# Patient Record
Sex: Female | Born: 1990 | Race: Black or African American | Hispanic: No | Marital: Single | Smoking: Current every day smoker
Health system: Southern US, Community
[De-identification: ages and names within clinical notes are randomized; demographics above are authoritative.]

## PROBLEM LIST (undated history)

## (undated) ENCOUNTER — Inpatient Hospital Stay (HOSPITAL_COMMUNITY): Payer: Self-pay

## (undated) DIAGNOSIS — O139 Gestational [pregnancy-induced] hypertension without significant proteinuria, unspecified trimester: Secondary | ICD-10-CM

## (undated) DIAGNOSIS — N739 Female pelvic inflammatory disease, unspecified: Secondary | ICD-10-CM

## (undated) DIAGNOSIS — K219 Gastro-esophageal reflux disease without esophagitis: Secondary | ICD-10-CM

## (undated) DIAGNOSIS — I10 Essential (primary) hypertension: Secondary | ICD-10-CM

## (undated) HISTORY — DX: Gestational (pregnancy-induced) hypertension without significant proteinuria, unspecified trimester: O13.9

## (undated) HISTORY — PX: WISDOM TOOTH EXTRACTION: SHX21

---
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
## 2010-06-01 DIAGNOSIS — O365990 Maternal care for other known or suspected poor fetal growth, unspecified trimester, not applicable or unspecified: Secondary | ICD-10-CM

## 2012-10-28 ENCOUNTER — Emergency Department (HOSPITAL_COMMUNITY): Payer: Self-pay

## 2012-10-28 ENCOUNTER — Encounter (HOSPITAL_COMMUNITY): Payer: Self-pay | Admitting: *Deleted

## 2012-10-28 ENCOUNTER — Emergency Department (HOSPITAL_COMMUNITY)
Admission: EM | Admit: 2012-10-28 | Discharge: 2012-10-28 | Disposition: A | Discharge status: Home / Self Care | Payer: Self-pay | Attending: Emergency Medicine | Admitting: Emergency Medicine

## 2012-10-28 DIAGNOSIS — S0083XA Contusion of other part of head, initial encounter: Secondary | ICD-10-CM

## 2012-10-28 DIAGNOSIS — Z79899 Other long term (current) drug therapy: Secondary | ICD-10-CM | POA: Insufficient documentation

## 2012-10-28 DIAGNOSIS — S0990XA Unspecified injury of head, initial encounter: Secondary | ICD-10-CM | POA: Insufficient documentation

## 2012-10-28 DIAGNOSIS — F17200 Nicotine dependence, unspecified, uncomplicated: Secondary | ICD-10-CM | POA: Insufficient documentation

## 2012-10-28 DIAGNOSIS — S0003XA Contusion of scalp, initial encounter: Secondary | ICD-10-CM | POA: Insufficient documentation

## 2012-10-28 MED ORDER — HYDROCODONE-ACETAMINOPHEN 5-325 MG PO TABS
2.0000 | ORAL_TABLET | Freq: Once | ORAL | Status: AC
  Administered 2012-10-28: 2 via ORAL
  Filled 2012-10-28: qty 2

## 2012-10-28 MED ORDER — NAPROXEN 500 MG PO TABS: 500.0000 mg | ORAL_TABLET | Freq: Two times a day (BID) | ORAL | Status: DC

## 2012-10-28 NOTE — ED Notes (Signed)
Discharge instructions reviewed with pt, questions answered. Pt verbalized understanding.  
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
## 2012-10-28 NOTE — ED Notes (Signed)
[employer] finished speaking with pt

## 2013-04-21 ENCOUNTER — Encounter (HOSPITAL_COMMUNITY): Payer: Self-pay | Admitting: Emergency Medicine

## 2013-04-21 ENCOUNTER — Emergency Department (HOSPITAL_COMMUNITY)
Admission: EM | Admit: 2013-04-21 | Discharge: 2013-04-22 | Disposition: A | Discharge status: Home / Self Care | Payer: BC Managed Care – PPO | Attending: Emergency Medicine | Admitting: Emergency Medicine

## 2013-04-21 DIAGNOSIS — F17200 Nicotine dependence, unspecified, uncomplicated: Secondary | ICD-10-CM | POA: Insufficient documentation

## 2013-04-21 DIAGNOSIS — Z3202 Encounter for pregnancy test, result negative: Secondary | ICD-10-CM | POA: Insufficient documentation

## 2013-04-21 DIAGNOSIS — N949 Unspecified condition associated with female genital organs and menstrual cycle: Secondary | ICD-10-CM | POA: Insufficient documentation

## 2013-04-21 DIAGNOSIS — R102 Pelvic and perineal pain: Secondary | ICD-10-CM

## 2013-04-21 DIAGNOSIS — R3919 Other difficulties with micturition: Secondary | ICD-10-CM | POA: Insufficient documentation

## 2013-04-21 DIAGNOSIS — R112 Nausea with vomiting, unspecified: Secondary | ICD-10-CM | POA: Insufficient documentation

## 2013-04-21 LAB — CBC WITH DIFFERENTIAL/PLATELET
Basophils Absolute: 0 10*3/uL (ref 0.0–0.1)
Basophils Relative: 0 % (ref 0–1)
Eosinophils Absolute: 0.4 10*3/uL (ref 0.0–0.7)
Eosinophils Relative: 3 % (ref 0–5)
HCT: 40.1 % (ref 36.0–46.0)
Hemoglobin: 13 g/dL (ref 12.0–15.0)
Lymphocytes Relative: 25 % (ref 12–46)
Lymphs Abs: 3.2 10*3/uL (ref 0.7–4.0)
MCH: 28.3 pg (ref 26.0–34.0)
MCHC: 32.4 g/dL (ref 30.0–36.0)
MCV: 87.2 fL (ref 78.0–100.0)
Monocytes Absolute: 0.6 10*3/uL (ref 0.1–1.0)
Monocytes Relative: 5 % (ref 3–12)
Neutro Abs: 8.7 10*3/uL — ABNORMAL HIGH (ref 1.7–7.7)
Neutrophils Relative %: 67 % (ref 43–77)
Platelets: 256 10*3/uL (ref 150–400)
RBC: 4.6 MIL/uL (ref 3.87–5.11)
RDW: 14.3 % (ref 11.5–15.5)
WBC: 12.9 10*3/uL — ABNORMAL HIGH (ref 4.0–10.5)

## 2013-04-21 LAB — COMPREHENSIVE METABOLIC PANEL
ALT: 10 U/L (ref 0–35)
AST: 17 U/L (ref 0–37)
Albumin: 3.8 g/dL (ref 3.5–5.2)
Alkaline Phosphatase: 67 U/L (ref 39–117)
BUN: 12 mg/dL (ref 6–23)
CO2: 26 mEq/L (ref 19–32)
Calcium: 9.3 mg/dL (ref 8.4–10.5)
Chloride: 103 mEq/L (ref 96–112)
Creatinine, Ser: 0.91 mg/dL (ref 0.50–1.10)
GFR calc Af Amer: >90 mL/min (ref >90)
GFR calc non Af Amer: 88 mL/min — ABNORMAL LOW (ref >90)
Glucose, Bld: 94 mg/dL (ref 70–99)
Potassium: 4 mEq/L (ref 3.7–5.3)
Sodium: 139 mEq/L (ref 137–147)
Total Bilirubin: 0.5 mg/dL (ref 0.3–1.2)
Total Protein: 7.7 g/dL (ref 6.0–8.3)

## 2013-04-21 LAB — POC URINE PREG, ED: Preg Test, Ur: NEGATIVE

## 2013-04-21 MED ORDER — ONDANSETRON HCL 4 MG/2ML IJ SOLN
4.0000 mg | Freq: Once | INTRAMUSCULAR | Status: AC
  Administered 2013-04-22: 4 mg via INTRAMUSCULAR
  Filled 2013-04-21: qty 2

## 2013-04-21 MED ORDER — MORPHINE SULFATE 4 MG/ML IJ SOLN
4.0000 mg | Freq: Once | INTRAMUSCULAR | Status: AC
  Administered 2013-04-22: 4 mg via INTRAVENOUS
  Filled 2013-04-21: qty 1

## 2013-04-21 NOTE — ED Notes (Signed)
Pt c/o lower abdominal pain and nausea since this morning.

## 2013-04-22 ENCOUNTER — Emergency Department (HOSPITAL_COMMUNITY): Payer: BC Managed Care – PPO

## 2013-04-22 LAB — URINALYSIS, ROUTINE W REFLEX MICROSCOPIC
Bilirubin Urine: NEGATIVE
Glucose, UA: NEGATIVE mg/dL
Hgb urine dipstick: NEGATIVE
Ketones, ur: NEGATIVE mg/dL
Leukocytes, UA: NEGATIVE
Nitrite: NEGATIVE
Protein, ur: NEGATIVE mg/dL
Specific Gravity, Urine: 1.02 (ref 1.005–1.030)
Urobilinogen, UA: 0.2 mg/dL (ref 0.0–1.0)
pH: 8 (ref 5.0–8.0)

## 2013-04-22 LAB — WET PREP, GENITAL
Clue Cells Wet Prep HPF POC: NONE SEEN
Trich, Wet Prep: NONE SEEN
Yeast Wet Prep HPF POC: NONE SEEN

## 2013-04-22 LAB — PREGNANCY, URINE: Preg Test, Ur: NEGATIVE

## 2013-04-22 LAB — RPR: RPR Ser Ql: NONREACTIVE

## 2013-04-22 MED ORDER — HYDROCODONE-ACETAMINOPHEN 5-325 MG PO TABS: 1.0000 | ORAL_TABLET | ORAL | Status: DC | PRN

## 2013-04-22 MED ORDER — ONDANSETRON 8 MG PO TBDP: 8.0000 mg | ORAL_TABLET | Freq: Three times a day (TID) | ORAL | Status: DC | PRN

## 2013-04-22 MED ORDER — MORPHINE SULFATE 4 MG/ML IJ SOLN
8.0000 mg | Freq: Once | INTRAMUSCULAR | Status: AC
  Administered 2013-04-22: 8 mg via INTRAVENOUS
  Filled 2013-04-22: qty 2

## 2013-04-22 MED ORDER — MORPHINE SULFATE 4 MG/ML IJ SOLN
INTRAMUSCULAR | Status: AC
  Filled 2013-04-22: qty 1

## 2013-04-22 NOTE — Discharge Instructions (Signed)
Abdominal Pain, Women °Abdominal (stomach, pelvic, or belly) pain can be caused by many things. It is important to tell your doctor: °· The location of the pain. °· Does it come and go or is it present all the time? °· Are there things that start the pain (eating certain foods, exercise)? °· Are there other symptoms associated with the pain (fever, nausea, vomiting, diarrhea)? °All of this is helpful to know when trying to find the cause of the pain. °CAUSES  °· Stomach: virus or bacteria infection, or ulcer. °· Intestine: appendicitis (inflamed appendix), regional ileitis (Crohn's disease), ulcerative colitis (inflamed colon), irritable bowel syndrome, diverticulitis (inflamed diverticulum of the colon), or cancer of the stomach or intestine. °· Gallbladder disease or stones in the gallbladder. °· Kidney disease, kidney stones, or infection. °· Pancreas infection or cancer. °· Fibromyalgia (pain disorder). °· Diseases of the female organs: °· Uterus: fibroid (non-cancerous) tumors or infection. °· Fallopian tubes: infection or tubal pregnancy. °· Ovary: cysts or tumors. °· Pelvic adhesions (scar tissue). °· Endometriosis (uterus lining tissue growing in the pelvis and on the pelvic organs). °· Pelvic congestion syndrome (female organs filling up with blood just before the menstrual period). °· Pain with the menstrual period. °· Pain with ovulation (producing an egg). °· Pain with an IUD (intrauterine device, birth control) in the uterus. °· Cancer of the female organs. °· Functional pain (pain not caused by a disease, may improve without treatment). °· Psychological pain. °· Depression. °DIAGNOSIS  °Your doctor will decide the seriousness of your pain by doing an examination. °· Blood tests. °· X-rays. °· Ultrasound. °· CT scan (computed tomography, special type of X-ray). °· MRI (magnetic resonance imaging). °· Cultures, for infection. °· Barium enema (dye inserted in the large intestine, to better view it with  X-rays). °· Colonoscopy (looking in intestine with a lighted tube). °· Laparoscopy (minor surgery, looking in abdomen with a lighted tube). °· Major abdominal exploratory surgery (looking in abdomen with a large incision). °TREATMENT  °The treatment will depend on the cause of the pain.  °· Many cases can be observed and treated at home. °· Over-the-counter medicines recommended by your caregiver. °· Prescription medicine. °· Antibiotics, for infection. °· Birth control pills, for painful periods or for ovulation pain. °· Hormone treatment, for endometriosis. °· Nerve blocking injections. °· Physical therapy. °· Antidepressants. °· Counseling with a psychologist or psychiatrist. °· Minor or major surgery. °HOME CARE INSTRUCTIONS  °· Do not take laxatives, unless directed by your caregiver. °· Take over-the-counter pain medicine only if ordered by your caregiver. Do not take aspirin because it can cause an upset stomach or bleeding. °· Try a clear liquid diet (broth or water) as ordered by your caregiver. Slowly move to a bland diet, as tolerated, if the pain is related to the stomach or intestine. °· Have a thermometer and take your temperature several times a day, and record it. °· Bed rest and sleep, if it helps the pain. °· Avoid sexual intercourse, if it causes pain. °· Avoid stressful situations. °· Keep your follow-up appointments and tests, as your caregiver orders. °· If the pain does not go away with medicine or surgery, you may try: °· Acupuncture. °· Relaxation exercises (yoga, meditation). °· Group therapy. °· Counseling. °SEEK MEDICAL CARE IF:  °· You notice certain foods cause stomach pain. °· Your home care treatment is not helping your pain. °· You need stronger pain medicine. °· You want your IUD removed. °· You feel faint or   lightheaded. °· You develop nausea and vomiting. °· You develop a rash. °· You are having side effects or an allergy to your medicine. °SEEK IMMEDIATE MEDICAL CARE IF:  °· Your  pain does not go away or gets worse. °· You have a fever. °· Your pain is felt only in portions of the abdomen. The right side could possibly be appendicitis. The left lower portion of the abdomen could be colitis or diverticulitis. °· You are passing blood in your stools (bright red or black tarry stools, with or without vomiting). °· You have blood in your urine. °· You develop chills, with or without a fever. °· You pass out. °MAKE SURE YOU:  °· Understand these instructions. °· Will watch your condition. °· Will get help right away if you are not doing well or get worse. °Document Released: 11/08/2006 Document Revised: 04/05/2011 Document Reviewed: 11/28/2008 °[employer]® Patient Information ©2014 [employer]. ° °

## 2013-04-22 NOTE — ED Provider Notes (Signed)
CSN: [id number]     Arrival date & time 04/21/13  2132 History   First MD Initiated Contact with Patient 04/21/13 2218     Chief Complaint  Patient presents with  . Abdominal Pain     (Consider location/radiation/quality/duration/timing/severity/associated sxs/prior Treatment) HPI Comments: Priscilla Castillo is a 23 y.o. Female presenting with lower abdominal pain with nausea and vomiting x 2 starting gradually around 1 pm today.  She describes having some vague lower left side pain, cramping (like menstrual cramping) with intermittent sharp stabs of pain which has slowly progressed to now include her entire lower pelvis. She also had low back pain yesterday which has now improved and has felt some increased pressure with need to urinate small amounts of urine, but denies painful urination or hematuria.  She has had an appetite but has been unable to keep anything down since this afternoon. She denies diarrhea or constipation.  She denies fevers or chills.  She has had no vaginal discharge and has had normal regular periods, LMP 04/01/13.  She does report having a recent std screening at the local health dept which was negative.  Past medical history is significant for c section x 2 (most recently 3 years ago).      The history is provided by the patient.    History reviewed. No pertinent past medical history. Past Surgical History  Procedure Laterality Date  . Cesarean section      x 2   History reviewed. No pertinent family history. History  Substance Use Topics  . Smoking status: Current Every Day Smoker -- 0.33 packs/day    Types: Cigarettes  . Smokeless tobacco: Not on file  . Alcohol Use: Yes     Comment: alcoholic beverage 2 x week   OB History   Grav Para Term Preterm Abortions TAB SAB Ect Mult Living   4 2 2             Review of Systems  Constitutional: Negative for fever.  HENT: Negative for congestion and sore throat.   Eyes: Negative.   Respiratory: Negative for  chest tightness and shortness of breath.   Cardiovascular: Negative for chest pain.  Gastrointestinal: Negative for nausea and abdominal pain.  Genitourinary: Positive for decreased urine volume and pelvic pain. Negative for urgency, frequency and vaginal discharge.  Musculoskeletal: Negative for arthralgias, joint swelling and neck pain.  Skin: Negative.  Negative for rash and wound.  Neurological: Negative for dizziness, weakness, light-headedness, numbness and headaches.  Psychiatric/Behavioral: Negative.       Allergies  Shellfish allergy  Home Medications  No current outpatient prescriptions on file. BP 150/87  Pulse 89  Temp(Src) 100.1 F (37.8 C)  Resp 20  Ht 5\' 1"  (1.549 m)  Wt 200 lb (90.719 kg)  BMI 37.81 kg/m2  SpO2 96%  LMP 04/01/2013 Physical Exam  Nursing note and vitals reviewed. Constitutional: She appears well-developed and well-nourished.  HENT:  Head: Normocephalic and atraumatic.  Eyes: Conjunctivae are normal.  Neck: Normal range of motion.  Cardiovascular: Normal rate, regular rhythm, normal heart sounds and intact distal pulses.   Pulmonary/Chest: Effort normal and breath sounds normal. She has no wheezes.  Abdominal: Soft. Bowel sounds are normal. There is no hepatosplenomegaly. There is tenderness. There is no rebound, no CVA tenderness and no tenderness at McBurney's point.  Genitourinary: Vagina normal. Uterus is tender. Uterus is not enlarged. Cervix exhibits no motion tenderness. Right adnexum displays no mass and no fullness. Left adnexum displays no mass,  no tenderness and no fullness. No vaginal discharge found.  Musculoskeletal: Normal range of motion.  Neurological: She is alert.  Skin: Skin is warm and dry.  Psychiatric: She has a normal mood and affect.    ED Course  Procedures (including critical care time) Labs Review Labs Reviewed  WET PREP, GENITAL - Abnormal; Notable for the following:    WBC, Wet Prep HPF POC FEW (*)    All  other components within normal limits  CBC WITH DIFFERENTIAL - Abnormal; Notable for the following:    WBC 12.9 (*)    Neutro Abs 8.7 (*)    All other components within normal limits  COMPREHENSIVE METABOLIC PANEL - Abnormal; Notable for the following:    GFR calc non Af Amer 88 (*)    All other components within normal limits  GC/CHLAMYDIA PROBE AMP  URINALYSIS, ROUTINE W REFLEX MICROSCOPIC  PREGNANCY, URINE  RPR  POC URINE PREG, ED   Imaging Review No results found.   EKG Interpretation None      MDM   Final diagnoses:  Pelvic pain    Patients labs and/or radiological studies were viewed and considered during the medical decision making and disposition process. Pt with llq pelvic pain with sharp stabs of pain radiating to the midline and rlq.  Will need US to assess for ovarian torsion vs  Cyst.  Pt pain 8/10 after receiving morphine 4 mg, will repeat morphine 8 mg while awaiting US.  Discussed with Dr. Patria Maneampos who will assume care of patient.    Burgess AmorJulie Ashira Kirsten, PA-C 04/22/13 [phone]

## 2013-04-22 NOTE — ED Provider Notes (Signed)
Medical screening examination/treatment/procedure(s) were conducted as a shared visit with non-physician practitioner(s) and myself.  I personally evaluated the patient during the encounter.   EKG Interpretation None      US Transvaginal Non-ob  04/22/2013   CLINICAL DATA:  Pelvic pain.  Assess for cyst or torsion.  EXAM: TRANSABDOMINAL AND TRANSVAGINAL ULTRASOUND OF PELVIS  DOPPLER ULTRASOUND OF OVARIES  TECHNIQUE: Both transabdominal and transvaginal ultrasound examinations of the pelvis were performed. Transabdominal technique was performed for global imaging of the pelvis including uterus, ovaries, adnexal regions, and pelvic cul-de-sac.  It was necessary to proceed with endovaginal exam following the transabdominal exam to visualize the endometrium and adnexa. Color and duplex Doppler ultrasound was utilized to evaluate blood flow to the ovaries.  COMPARISON:  None.  FINDINGS: Uterus  Measurements: 8 x 4 x 5 cm. No fibroids or other mass visualized. Cesarean section scar noted.  Endometrium  Thickness: 11 mm.  No focal abnormality visualized.  Right ovary  Measurements: 3.4 x 1.8 x 1.6 cm. Normal appearance/no adnexal mass.  Left ovary  Measurements: 3.9 x 2.5 x 2.7 cm. Normal appearance/no adnexal mass.  Pulsed Doppler evaluation of both ovaries demonstrates normal low-resistance arterial and venous waveforms.  Other findings  On transvaginal imaging, apparent thickening of the bladder wall is presumably related to underdistention given the negative urinalysis.  IMPRESSION: Negative pelvic ultrasound.  No ovarian torsion.   Electronically Signed   By: Priscilla Castillo M.D.   On: 04/22/2013 03:18   US Pelvis Complete  04/22/2013   CLINICAL DATA:  Pelvic pain.  Assess for cyst or torsion.  EXAM: TRANSABDOMINAL AND TRANSVAGINAL ULTRASOUND OF PELVIS  DOPPLER ULTRASOUND OF OVARIES  TECHNIQUE: Both transabdominal and transvaginal ultrasound examinations of the pelvis were performed. Transabdominal  technique was performed for global imaging of the pelvis including uterus, ovaries, adnexal regions, and pelvic cul-de-sac.  It was necessary to proceed with endovaginal exam following the transabdominal exam to visualize the endometrium and adnexa. Color and duplex Doppler ultrasound was utilized to evaluate blood flow to the ovaries.  COMPARISON:  None.  FINDINGS: Uterus  Measurements: 8 x 4 x 5 cm. No fibroids or other mass visualized. Cesarean section scar noted.  Endometrium  Thickness: 11 mm.  No focal abnormality visualized.  Right ovary  Measurements: 3.4 x 1.8 x 1.6 cm. Normal appearance/no adnexal mass.  Left ovary  Measurements: 3.9 x 2.5 x 2.7 cm. Normal appearance/no adnexal mass.  Pulsed Doppler evaluation of both ovaries demonstrates normal low-resistance arterial and venous waveforms.  Other findings  On transvaginal imaging, apparent thickening of the bladder wall is presumably related to underdistention given the negative urinalysis.  IMPRESSION: Negative pelvic ultrasound.  No ovarian torsion.   Electronically Signed   By: Priscilla Castillo M.D.   On: 04/22/2013 03:18   [place] Art/ven Flow Abd Pelv Doppler  04/22/2013   CLINICAL DATA:  Pelvic pain.  Assess for cyst or torsion.  EXAM: TRANSABDOMINAL AND TRANSVAGINAL ULTRASOUND OF PELVIS  DOPPLER ULTRASOUND OF OVARIES  TECHNIQUE: Both transabdominal and transvaginal ultrasound examinations of the pelvis were performed. Transabdominal technique was performed for global imaging of the pelvis including uterus, ovaries, adnexal regions, and pelvic cul-de-sac.  It was necessary to proceed with endovaginal exam following the transabdominal exam to visualize the endometrium and adnexa. Color and duplex Doppler ultrasound was utilized to evaluate blood flow to the ovaries.  COMPARISON:  None.  FINDINGS: Uterus  Measurements: 8 x 4 x 5 cm. No fibroids or other mass  visualized. Cesarean section scar noted.  Endometrium  Thickness: 11 mm.  No focal abnormality  visualized.  Right ovary  Measurements: 3.4 x 1.8 x 1.6 cm. Normal appearance/no adnexal mass.  Left ovary  Measurements: 3.9 x 2.5 x 2.7 cm. Normal appearance/no adnexal mass.  Pulsed Doppler evaluation of both ovaries demonstrates normal low-resistance arterial and venous waveforms.  Other findings  On transvaginal imaging, apparent thickening of the bladder wall is presumably related to underdistention given the negative urinalysis.  IMPRESSION: Negative pelvic ultrasound.  No ovarian torsion.   Electronically Signed   By: Priscilla PeaJonathan  Castillo M.D.   On: 04/22/2013 03:18   4:27 AM Patient is feeling better at this time.  Abdominal exam demonstrates only very mild tenderness around her suprapubic and infraumbilical region.  No left lower quadrant tenderness.  No right lower quadrant tenderness.  No guarding or rebound.  No peritoneal signs.  I long discussion with the patient and the patient's significant other in the room.  The patient has had intermittent symptoms like this before with this seems more severe.  We had long discussion about the possibility of this being appendicitis.  She's had symptoms for approximately 12-14 hours at this time.  The patient was given the option of CT scan now versus observation at home with repeat ER visit in 12 hours if she continues having symptoms.  She would prefer to go home at this time.  She does state that she will return later today if she continues having discomfort at which point she will likely need a CT scan of her abdomen and pelvis.  My suspicion for appendicitis is lower at this time and she's had intermittent issues before this may be more GYN related.  She does have a mild leukocytosis here.  She did have an oral temperature 100.2.  No other significant abnormalities.  Patient be sent home with pain medicine and instructed to return to the ER in 12 hours for ongoing symptoms.   Priscilla CoKevin M Todd Argabright, MD 04/22/13 0430

## 2013-04-23 LAB — GC/CHLAMYDIA PROBE AMP
CT Probe RNA: NEGATIVE
GC Probe RNA: NEGATIVE

## 2013-08-05 ENCOUNTER — Encounter (HOSPITAL_COMMUNITY): Payer: Self-pay | Admitting: Emergency Medicine

## 2013-08-05 ENCOUNTER — Emergency Department (HOSPITAL_COMMUNITY)
Admission: EM | Admit: 2013-08-05 | Discharge: 2013-08-05 | Disposition: A | Discharge status: Home / Self Care | Payer: BC Managed Care – PPO | Attending: Emergency Medicine | Admitting: Emergency Medicine

## 2013-08-05 DIAGNOSIS — R42 Dizziness and giddiness: Secondary | ICD-10-CM | POA: Diagnosis not present

## 2013-08-05 DIAGNOSIS — R110 Nausea: Secondary | ICD-10-CM | POA: Insufficient documentation

## 2013-08-05 DIAGNOSIS — E860 Dehydration: Secondary | ICD-10-CM | POA: Diagnosis not present

## 2013-08-05 DIAGNOSIS — F411 Generalized anxiety disorder: Secondary | ICD-10-CM | POA: Diagnosis not present

## 2013-08-05 DIAGNOSIS — F17200 Nicotine dependence, unspecified, uncomplicated: Secondary | ICD-10-CM | POA: Diagnosis not present

## 2013-08-05 DIAGNOSIS — Z3202 Encounter for pregnancy test, result negative: Secondary | ICD-10-CM | POA: Insufficient documentation

## 2013-08-05 DIAGNOSIS — R000 Tachycardia, unspecified: Secondary | ICD-10-CM | POA: Diagnosis not present

## 2013-08-05 DIAGNOSIS — R0602 Shortness of breath: Secondary | ICD-10-CM | POA: Diagnosis not present

## 2013-08-05 LAB — URINALYSIS, ROUTINE W REFLEX MICROSCOPIC
Bilirubin Urine: NEGATIVE
Glucose, UA: NEGATIVE mg/dL
Hgb urine dipstick: NEGATIVE
Ketones, ur: NEGATIVE mg/dL
Nitrite: NEGATIVE
Protein, ur: NEGATIVE mg/dL
Specific Gravity, Urine: 1.01 (ref 1.005–1.030)
Urobilinogen, UA: 0.2 mg/dL (ref 0.0–1.0)
pH: 6 (ref 5.0–8.0)

## 2013-08-05 LAB — I-STAT CHEM 8, ED
BUN: 19 mg/dL (ref 6–23)
Calcium, Ion: 1.17 mmol/L (ref 1.12–1.23)
Chloride: 106 mEq/L (ref 96–112)
Creatinine, Ser: 1.1 mg/dL (ref 0.50–1.10)
Glucose, Bld: 101 mg/dL — ABNORMAL HIGH (ref 70–99)
HCT: 46 % (ref 36.0–46.0)
Hemoglobin: 15.6 g/dL — ABNORMAL HIGH (ref 12.0–15.0)
Potassium: 4.8 mEq/L (ref 3.7–5.3)
Sodium: 136 mEq/L — ABNORMAL LOW (ref 137–147)
TCO2: 23 mmol/L (ref 0–100)

## 2013-08-05 LAB — URINE MICROSCOPIC-ADD ON

## 2013-08-05 LAB — POC URINE PREG, ED: Preg Test, Ur: NEGATIVE

## 2013-08-05 MED ORDER — SODIUM CHLORIDE 0.9 % IV BOLUS (SEPSIS)
1000.0000 mL | Freq: Once | INTRAVENOUS | Status: AC
  Administered 2013-08-05: 1000 mL via INTRAVENOUS

## 2013-08-05 NOTE — ED Notes (Signed)
Pt states she when she got up approx noon Saturday, states she had this tightness in her chest that has continued through the day.  Pt also c/o headache and nausea

## 2013-08-05 NOTE — Discharge Instructions (Signed)
Drink plenty of fluids - return to ER if symtpoms worsen.  Your testing has all been normal !

## 2013-08-05 NOTE — ED Provider Notes (Signed)
CSN: [id number]     Arrival date & time 08/05/13  0000 History   First MD Initiated Contact with Patient 08/05/13 0005    This chart was scribed for Vida RollerBrian D Prentiss Hammett, MD by Leona CarryG. Clay Sherrill, ED Scribe. The patient was seen in APA19/APA19. The patient's care was started at 12:07 AM.   No chief complaint on file.  HPI HPI Comments: Priscilla Castillo is a 23 y.o. female with a history of smoking (5 cigarettes/day)who complains of one day of multiple symptoms including lightheadedness, slight SOB, anxiety, "hot-flashes," and nausea. Nothing makes it better or worse. Symptoms are intermittent. Denies fever, chills, cough, dysuria, or diarrhea. No meds prior to arrival. History of anxiety. Not on prescriptions. Patient is concerned she is having a heart attack despite not having any risk factors other than smoking five cigarettes per day.  Sx are intermittent, were gradual in onset and nothing seems to make better or worse - she went to a cook out and ate fish and hamburger without difficulty or vomiting.  No abd pain, no cough, no hemoptysis and no rf for PE   History reviewed. No pertinent past medical history. Past Surgical History  Procedure Laterality Date  . Cesarean section      x 2   No family history on file. History  Substance Use Topics  . Smoking status: Current Every Day Smoker -- 0.33 packs/day    Types: Cigarettes  . Smokeless tobacco: Not on file  . Alcohol Use: Yes     Comment: alcoholic beverage 2 x week   OB History   Grav Para Term Preterm Abortions TAB SAB Ect Mult Living   4 2 2             Review of Systems  All other systems reviewed and are negative.     Allergies  Shellfish allergy  Home Medications   Prior to Admission medications   Medication Sig Start Date End Date Taking? Authorizing Provider  HYDROcodone-acetaminophen (NORCO/VICODIN) 5-325 MG per tablet Take 1 tablet by mouth every 4 (four) hours as needed for moderate pain. 04/22/13   Lyanne CoKevin M Campos,  MD  ondansetron (ZOFRAN ODT) 8 MG disintegrating tablet Take 1 tablet (8 mg total) by mouth every 8 (eight) hours as needed for nausea or vomiting. 04/22/13   Lyanne CoKevin M Campos, MD   Triage Vitals: BP 153/83  Pulse 124  Resp 16  SpO2 100% Physical Exam  Nursing note and vitals reviewed. Constitutional: She is oriented to person, place, and time. She appears well-developed and well-nourished. No distress.  Anxious.  HENT:  Head: Normocephalic and atraumatic.  Eyes: Conjunctivae and EOM are normal.  Neck: Neck supple. No tracheal deviation present.  Cardiovascular:  No murmur heard. Tachycardic.  Pulmonary/Chest: Effort normal. No respiratory distress.  Musculoskeletal: Normal range of motion.  Neurological: She is alert and oriented to person, place, and time.  Skin: Skin is warm and dry.  Psychiatric: She has a normal mood and affect. Her behavior is normal.    ED Course  Procedures (including critical care time) DIAGNOSTIC STUDIES: Oxygen Saturation is 100% on room air, normal by my interpretation.    COORDINATION OF CARE: 12:14 AM-Discussed treatment plan which includes labs, ECG with pt at bedside and pt agreed to plan.     Labs Review Labs Reviewed  URINALYSIS, ROUTINE W REFLEX MICROSCOPIC - Abnormal; Notable for the following:    Leukocytes, UA TRACE (*)    All other components within normal limits  URINE MICROSCOPIC-ADD ON - Abnormal; Notable for the following:    Squamous Epithelial / LPF FEW (*)    All other components within normal limits  I-STAT CHEM 8, ED - Abnormal; Notable for the following:    Sodium 136 (*)    Glucose, Bld 101 (*)    Hemoglobin 15.6 (*)    All other components within normal limits  POC URINE PREG, ED    Imaging Review No results found.  ED ECG REPORT  I personally interpreted this EKG   Date: 08/05/2013   Rate: 97  Rhythm: normal sinus rhythm  QRS Axis: normal  Intervals: normal  ST/T Wave abnormalities: normal  Conduction  Disutrbances:none  Narrative Interpretation:   Old EKG Reviewed: none available   MDM   Final diagnoses:  Light headed  Dehydration    Well appearing and after some time and fluids she became much more calm - has pulse of 65 on repeat exam, normal labs and ua and preg.  ECG normal - stable for d/c.  Pt informed of restults and agreeable to d/c.  I personally performed the services described in this documentation, which was scribed in my presence. The recorded information has been reviewed and is accurate.   =   Vida Roller, MD 08/05/13 [phone]

## 2013-08-21 ENCOUNTER — Emergency Department (HOSPITAL_COMMUNITY)
Admission: EM | Admit: 2013-08-21 | Discharge: 2013-08-21 | Disposition: A | Discharge status: Home / Self Care | Payer: BC Managed Care – PPO | Attending: Emergency Medicine | Admitting: Emergency Medicine

## 2013-08-21 ENCOUNTER — Encounter (HOSPITAL_COMMUNITY): Payer: Self-pay | Admitting: Emergency Medicine

## 2013-08-21 ENCOUNTER — Emergency Department (HOSPITAL_COMMUNITY): Payer: BC Managed Care – PPO

## 2013-08-21 DIAGNOSIS — O9989 Other specified diseases and conditions complicating pregnancy, childbirth and the puerperium: Secondary | ICD-10-CM | POA: Insufficient documentation

## 2013-08-21 DIAGNOSIS — Z79899 Other long term (current) drug therapy: Secondary | ICD-10-CM | POA: Insufficient documentation

## 2013-08-21 DIAGNOSIS — N939 Abnormal uterine and vaginal bleeding, unspecified: Secondary | ICD-10-CM

## 2013-08-21 DIAGNOSIS — O209 Hemorrhage in early pregnancy, unspecified: Secondary | ICD-10-CM | POA: Insufficient documentation

## 2013-08-21 DIAGNOSIS — R109 Unspecified abdominal pain: Secondary | ICD-10-CM | POA: Insufficient documentation

## 2013-08-21 DIAGNOSIS — Z87891 Personal history of nicotine dependence: Secondary | ICD-10-CM | POA: Insufficient documentation

## 2013-08-21 LAB — COMPREHENSIVE METABOLIC PANEL
ALT: <5 U/L (ref 0–35)
AST: 13 U/L (ref 0–37)
Albumin: 3.4 g/dL — ABNORMAL LOW (ref 3.5–5.2)
Alkaline Phosphatase: 48 U/L (ref 39–117)
Anion gap: 10 (ref 5–15)
BUN: 10 mg/dL (ref 6–23)
CO2: 24 mEq/L (ref 19–32)
Calcium: 9 mg/dL (ref 8.4–10.5)
Chloride: 105 mEq/L (ref 96–112)
Creatinine, Ser: 0.95 mg/dL (ref 0.50–1.10)
GFR calc Af Amer: >90 mL/min (ref >90)
GFR calc non Af Amer: 84 mL/min — ABNORMAL LOW (ref >90)
Glucose, Bld: 96 mg/dL (ref 70–99)
Potassium: 3.8 mEq/L (ref 3.7–5.3)
Sodium: 139 mEq/L (ref 137–147)
Total Bilirubin: 0.8 mg/dL (ref 0.3–1.2)
Total Protein: 6.7 g/dL (ref 6.0–8.3)

## 2013-08-21 LAB — CBC WITH DIFFERENTIAL/PLATELET
Basophils Absolute: 0 10*3/uL (ref 0.0–0.1)
Basophils Relative: 0 % (ref 0–1)
Eosinophils Absolute: 0.1 10*3/uL (ref 0.0–0.7)
Eosinophils Relative: 1 % (ref 0–5)
HCT: 39 % (ref 36.0–46.0)
Hemoglobin: 12.9 g/dL (ref 12.0–15.0)
Lymphocytes Relative: 25 % (ref 12–46)
Lymphs Abs: 2.1 10*3/uL (ref 0.7–4.0)
MCH: 28.2 pg (ref 26.0–34.0)
MCHC: 33.1 g/dL (ref 30.0–36.0)
MCV: 85.2 fL (ref 78.0–100.0)
Monocytes Absolute: 0.5 10*3/uL (ref 0.1–1.0)
Monocytes Relative: 6 % (ref 3–12)
Neutro Abs: 5.6 10*3/uL (ref 1.7–7.7)
Neutrophils Relative %: 68 % (ref 43–77)
Platelets: 233 10*3/uL (ref 150–400)
RBC: 4.58 MIL/uL (ref 3.87–5.11)
RDW: 14.3 % (ref 11.5–15.5)
WBC: 8.4 10*3/uL (ref 4.0–10.5)

## 2013-08-21 LAB — URINALYSIS, ROUTINE W REFLEX MICROSCOPIC
Bilirubin Urine: NEGATIVE
Glucose, UA: NEGATIVE mg/dL
Hgb urine dipstick: NEGATIVE
Leukocytes, UA: NEGATIVE
Nitrite: NEGATIVE
Protein, ur: NEGATIVE mg/dL
Specific Gravity, Urine: 1.025 (ref 1.005–1.030)
Urobilinogen, UA: 0.2 mg/dL (ref 0.0–1.0)
pH: 6 (ref 5.0–8.0)

## 2013-08-21 LAB — POC URINE PREG, ED: Preg Test, Ur: POSITIVE — AB

## 2013-08-21 LAB — HCG, QUANTITATIVE, PREGNANCY: hCG, Beta Chain, Quant, S: 153 m[IU]/mL — ABNORMAL HIGH (ref <5)

## 2013-08-21 NOTE — Discharge Instructions (Signed)
Follow up with your gyn-md Thursday or Friday.   [facility]

## 2013-08-21 NOTE — ED Notes (Signed)
Pt also states that she has pressure when she tries to urinate and then she is not able to urinate at times.

## 2013-08-21 NOTE — ED Provider Notes (Signed)
CSN: [id number]     Arrival date & time 08/21/13  [id number] History  This chart was scribed for Benny LennertJoseph L Meilech Virts, MD by Leone PayorSonum Patel, ED Scribe. This patient was seen in room APA11/APA11 and the patient's care was started 11:08 AM.    Chief Complaint  Patient presents with  . Vaginal Bleeding   Patient is a 23 y.o. female presenting with vaginal bleeding. The history is provided by the patient. No language interpreter was used.  Vaginal Bleeding Quality:  Unable to specify Severity:  Moderate Onset quality:  Gradual Duration:  3 days Progression:  Worsening Chronicity:  New Possible pregnancy: yes   Context: at rest   Relieved by:  Nothing Worsened by:  Nothing tried Associated symptoms: abdominal pain   Associated symptoms: no back pain and no fatigue   Risk factors: prior miscarriage     HPI Comments: Priscilla Castillo is a 23 y.o. female who presents to the Emergency Department complaining of 3 days of constant vaginal bleeding that worsened this morning. She reports associated, intermittent abdominal cramping which she states is mild. She was seen by [facility] who referred her to Ob-Gyn for an appointment on Monday. Patient states her LNMP was around 6/21 so she is about 5 weeks into the pregnancy. Patient report 2 prior deliveries via cesarean section and 1 prior miscarriage.   History reviewed. No pertinent past medical history. Past Surgical History  Procedure Laterality Date  . Cesarean section      x 2   No family history on file. History  Substance Use Topics  . Smoking status: Former Smoker -- 0.33 packs/day    Types: Cigarettes  . Smokeless tobacco: Not on file  . Alcohol Use: No     Comment: alcoholic beverage 2 x week, denies any use today,    OB History   Grav Para Term Preterm Abortions TAB SAB Ect Mult Living   4 2 2             Review of Systems  Constitutional: Negative for appetite change and fatigue.  HENT: Negative for congestion, ear  discharge and sinus pressure.   Eyes: Negative for discharge.  Respiratory: Negative for cough.   Cardiovascular: Negative for chest pain.  Gastrointestinal: Positive for abdominal pain. Negative for diarrhea.  Genitourinary: Positive for vaginal bleeding. Negative for frequency and hematuria.  Musculoskeletal: Negative for back pain.  Skin: Negative for rash.  Neurological: Negative for seizures and headaches.  Psychiatric/Behavioral: Negative for hallucinations.      Allergies  Shellfish allergy  Home Medications   Prior to Admission medications   Medication Sig Start Date End Date Taking? Authorizing Provider  Prenatal Vit-Fe Fumarate-FA (MULTIVITAMIN-PRENATAL) 27-0.8 MG TABS tablet Take 1 tablet by mouth daily at 12 noon.   Yes Historical Provider, MD  sertraline (ZOLOFT) 50 MG tablet Take 50 mg by mouth daily.    Historical Provider, MD   BP 135/78  Pulse 78  Temp(Src) 99.3 F (37.4 C) (Oral)  Resp 18  Ht 5\' 1"  (1.549 m)  Wt 215 lb (97.523 kg)  BMI 40.64 kg/m2  SpO2 100%  LMP 07/15/2013 Physical Exam  Nursing note and vitals reviewed. Constitutional: She is oriented to person, place, and time. She appears well-developed.  HENT:  Head: Normocephalic.  Eyes: Conjunctivae and EOM are normal. No scleral icterus.  Neck: Neck supple. No thyromegaly present.  Cardiovascular: Normal rate, regular rhythm and normal heart sounds.  Exam reveals no gallop and no friction rub.  No murmur heard. Pulmonary/Chest: Effort normal and breath sounds normal. No stridor. She has no wheezes. She has no rales. She exhibits no tenderness.  Abdominal: Soft. She exhibits no distension. There is tenderness (mild suprapubic tenderness). There is no rebound.  Musculoskeletal: Normal range of motion. She exhibits no edema.  Lymphadenopathy:    She has no cervical adenopathy.  Neurological: She is oriented to person, place, and time. She exhibits normal muscle tone. Coordination normal.  Skin:  No rash noted. No erythema.  Psychiatric: She has a normal mood and affect. Her behavior is normal.    ED Course  Procedures (including critical care time)  DIAGNOSTIC STUDIES: Oxygen Saturation is 100% on RA, normal by my interpretation.    COORDINATION OF CARE: 11:12 AM Discussed treatment plan with pt at bedside and pt agreed to plan.   Labs Review Labs Reviewed  URINALYSIS, ROUTINE W REFLEX MICROSCOPIC - Abnormal; Notable for the following:    Ketones, ur TRACE (*)    All other components within normal limits  POC URINE PREG, ED - Abnormal; Notable for the following:    Preg Test, Ur POSITIVE (*)    All other components within normal limits    Imaging Review No results found.   EKG Interpretation None      MDM   Final diagnoses:  None  vaginal bleeding.  Threatened abortion.  Pt to see ob-gyn in 2-3 days  The chart was scribed for me under my direct supervision.  I personally performed the history, physical, and medical decision making and all procedures in the evaluation of this patient.Benny Lennert, MD 08/21/13 1355

## 2013-08-21 NOTE — ED Notes (Signed)
Pt c/o intermittent vaginal bleeding that has been brown in color for the past three days, worse today, was seen at [facility] yesterday, had confirmed pregnancy test with [facility] yesterday. Denies any pain, last menstruate period was  ? July 15, 2013 which would place pt at 5 weeks 2 days into pregnancy,

## 2013-10-25 ENCOUNTER — Ambulatory Visit (HOSPITAL_COMMUNITY): Payer: Self-pay | Admitting: Psychiatry

## 2013-11-26 ENCOUNTER — Encounter (HOSPITAL_COMMUNITY): Payer: Self-pay | Admitting: Emergency Medicine

## 2014-04-07 ENCOUNTER — Encounter (HOSPITAL_COMMUNITY): Payer: Self-pay | Admitting: Emergency Medicine

## 2014-04-07 ENCOUNTER — Emergency Department (HOSPITAL_COMMUNITY)
Admission: EM | Admit: 2014-04-07 | Discharge: 2014-04-07 | Disposition: A | Discharge status: Home / Self Care | Payer: BLUE CROSS/BLUE SHIELD | Attending: Emergency Medicine | Admitting: Emergency Medicine

## 2014-04-07 ENCOUNTER — Emergency Department (HOSPITAL_COMMUNITY): Payer: BLUE CROSS/BLUE SHIELD

## 2014-04-07 DIAGNOSIS — R05 Cough: Secondary | ICD-10-CM | POA: Diagnosis not present

## 2014-04-07 DIAGNOSIS — Z87891 Personal history of nicotine dependence: Secondary | ICD-10-CM | POA: Insufficient documentation

## 2014-04-07 DIAGNOSIS — R062 Wheezing: Secondary | ICD-10-CM | POA: Diagnosis not present

## 2014-04-07 DIAGNOSIS — R0602 Shortness of breath: Secondary | ICD-10-CM | POA: Diagnosis not present

## 2014-04-07 DIAGNOSIS — R51 Headache: Secondary | ICD-10-CM | POA: Insufficient documentation

## 2014-04-07 DIAGNOSIS — Z79899 Other long term (current) drug therapy: Secondary | ICD-10-CM | POA: Diagnosis not present

## 2014-04-07 DIAGNOSIS — J029 Acute pharyngitis, unspecified: Secondary | ICD-10-CM | POA: Insufficient documentation

## 2014-04-07 DIAGNOSIS — H9202 Otalgia, left ear: Secondary | ICD-10-CM | POA: Diagnosis not present

## 2014-04-07 DIAGNOSIS — R059 Cough, unspecified: Secondary | ICD-10-CM

## 2014-04-07 MED ORDER — ACETAMINOPHEN 500 MG PO TABS
1000.0000 mg | ORAL_TABLET | Freq: Once | ORAL | Status: AC
  Administered 2014-04-07: 1000 mg via ORAL
  Filled 2014-04-07: qty 2

## 2014-04-07 MED ORDER — ALBUTEROL SULFATE HFA 108 (90 BASE) MCG/ACT IN AERS: 2.0000 | INHALATION_SPRAY | Freq: Once | RESPIRATORY_TRACT | Status: DC

## 2014-04-07 MED ORDER — PREDNISONE 50 MG PO TABS: 60.0000 mg | ORAL_TABLET | Freq: Once | ORAL | Status: DC

## 2014-04-07 NOTE — ED Notes (Signed)
Patient is resting comfortably. 

## 2014-04-07 NOTE — ED Notes (Signed)
Pt is 7 months pregnant

## 2014-04-07 NOTE — ED Notes (Signed)
Patient states she is still in pain. RN made aware.

## 2014-04-07 NOTE — ED Notes (Signed)
EDP at bedside  

## 2014-04-07 NOTE — ED Notes (Signed)
Pt c/o cough, congestion, low grade fever, scratchy throat and body aches.

## 2014-04-07 NOTE — Discharge Instructions (Signed)
Chest x-ray showed no pneumonia. Because you are pregnant, we do not recommend any prescription medication. Tylenol for fever/pain. Increase fluids. Follow-up your OB/GYN doctor this week.

## 2014-04-07 NOTE — ED Notes (Signed)
Patient states she is having pain at this time. RN made aware. 

## 2014-04-07 NOTE — ED Provider Notes (Signed)
CSN: [id number]     Arrival date & time 04/07/14  4098 History  This chart was scribed for Donnetta Hutching, MD by Ronney Lion, ED Scribe. This patient was seen in room APA08/APA08 and the patient's care was started at 9:20 AM.    Chief Complaint  Patient presents with  . Cough   The history is provided by the patient. No language interpreter was used.    HPI Comments: Priscilla Castillo is a 24 y.o. female who is 7 months pregnant who presents to the Emergency Department complaining of a severe, constant productive cough with yellow phlegm that began yesterday. She complains of associated pain in her chest and throat, wheezing, SOB, and pain near her left ear. She states she is susceptible to chest colds. She denies a history of asthma or smoking. Patient denies having a PCP.   History reviewed. No pertinent past medical history. Past Surgical History  Procedure Laterality Date  . Cesarean section      x 2   History reviewed. No pertinent family history. History  Substance Use Topics  . Smoking status: Former Smoker -- 0.33 packs/day    Types: Cigarettes  . Smokeless tobacco: Not on file  . Alcohol Use: No     Comment: alcoholic beverage 2 x week, denies any use today,    OB History    Gravida Para Term Preterm AB TAB SAB Ectopic Multiple Living   Review of Systems  HENT: Positive for sore throat.   Respiratory: Positive for cough, shortness of breath and wheezing.   Neurological: Positive for headaches.  All other systems reviewed and are negative.   Allergies  Shellfish allergy  Home Medications   Prior to Admission medications   Medication Sig Start Date End Date Taking? Authorizing Provider  Prenatal Vit-Fe Fumarate-FA (MULTIVITAMIN-PRENATAL) 27-0.8 MG TABS tablet Take 1 tablet by mouth daily.    Yes Historical Provider, MD   BP 119/64 mmHg  Pulse 102  Temp(Src) 98.7 F (37.1 C) (Oral)  Resp 16  Ht  (1.549 m)  Wt 219 lb (99.338 kg)  BMI  41.40 kg/m2  SpO2 100%  LMP 07/15/2013 Physical Exam  Constitutional: She is oriented to person, place, and time. She appears well-developed and well-nourished.  HENT:  Head: Normocephalic and atraumatic.  Tender in preauricular area.  Eyes: Conjunctivae and EOM are normal. Pupils are equal, round, and reactive to light.  Neck: Normal range of motion. Neck supple.  Cardiovascular: Normal rate and regular rhythm.   Pulmonary/Chest: Effort normal and breath sounds normal. She has no wheezes.  Abdominal: Soft. Bowel sounds are normal.  Musculoskeletal: Normal range of motion.  Neurological: She is alert and oriented to person, place, and time.  Skin: Skin is warm and dry.  Psychiatric: She has a normal mood and affect. Her behavior is normal.  Nursing note and vitals reviewed.   ED Course  Procedures (including critical care time)  DIAGNOSTIC STUDIES: Oxygen Saturation is 100% on room air, normal by my interpretation.    COORDINATION OF CARE: 9:24 AM - Doubt pneumonia. Likely viral infection. Discussed treatment plan with pt at bedside which includes CXR, and pt agreed to plan. Will prescribe Albuterol inhaler and prednisone pending CXR results.  Labs Review Labs Reviewed - No data to display  Imaging Review Dg Chest 2 View  04/07/2014   CLINICAL DATA:  24 year old female with history of cough since yesterday.  Patient is currently 7 months pregnant.  EXAM: CHEST  2 VIEW  COMPARISON:  No priors.  FINDINGS: Lung volumes are normal. No consolidative airspace disease. No pleural effusions. No pneumothorax. No pulmonary nodule or mass noted. Pulmonary vasculature and the cardiomediastinal silhouette are within normal limits.  IMPRESSION: No radiographic evidence of acute cardiopulmonary disease.   Electronically Signed   By: Trudie Reedaniel  Entrikin M.D.   On: 04/07/2014 10:14     MDM   Final diagnoses:  Cough   Patient is in no acute distress. No respiratory distress. Normal vital signs.  No vaginal bleeding or discharge. Suspect viral syndrome. Tylenol for fever or pain  I personally performed the services described in this documentation, which was scribed in my presence. The recorded information has been reviewed and is accurate.    Donnetta HutchingBrian Nerea Bordenave, MD 04/07/14 1154

## 2014-06-18 ENCOUNTER — Encounter (HOSPITAL_COMMUNITY): Payer: Self-pay

## 2014-06-18 ENCOUNTER — Emergency Department (HOSPITAL_COMMUNITY)
Admission: EM | Admit: 2014-06-18 | Discharge: 2014-06-18 | Disposition: A | Discharge status: Home / Self Care | Payer: BLUE CROSS/BLUE SHIELD | Attending: Emergency Medicine | Admitting: Emergency Medicine

## 2014-06-18 DIAGNOSIS — Z3A49 Greater than 42 weeks gestation of pregnancy: Secondary | ICD-10-CM | POA: Diagnosis not present

## 2014-06-18 DIAGNOSIS — Y9289 Other specified places as the place of occurrence of the external cause: Secondary | ICD-10-CM | POA: Diagnosis not present

## 2014-06-18 DIAGNOSIS — O9A213 Injury, poisoning and certain other consequences of external causes complicating pregnancy, third trimester: Secondary | ICD-10-CM | POA: Diagnosis not present

## 2014-06-18 DIAGNOSIS — Z87891 Personal history of nicotine dependence: Secondary | ICD-10-CM | POA: Insufficient documentation

## 2014-06-18 DIAGNOSIS — Z79899 Other long term (current) drug therapy: Secondary | ICD-10-CM | POA: Diagnosis not present

## 2014-06-18 DIAGNOSIS — O481 Prolonged pregnancy: Secondary | ICD-10-CM | POA: Diagnosis not present

## 2014-06-18 DIAGNOSIS — Y998 Other external cause status: Secondary | ICD-10-CM | POA: Diagnosis not present

## 2014-06-18 DIAGNOSIS — X58XXXA Exposure to other specified factors, initial encounter: Secondary | ICD-10-CM | POA: Insufficient documentation

## 2014-06-18 DIAGNOSIS — T7840XA Allergy, unspecified, initial encounter: Secondary | ICD-10-CM | POA: Diagnosis not present

## 2014-06-18 DIAGNOSIS — Y9389 Activity, other specified: Secondary | ICD-10-CM | POA: Diagnosis not present

## 2014-06-18 MED ORDER — DIPHENHYDRAMINE HCL 50 MG/ML IJ SOLN
INTRAMUSCULAR | Status: AC
  Filled 2014-06-18: qty 1

## 2014-06-18 MED ORDER — SODIUM CHLORIDE 0.9 % IV BOLUS (SEPSIS): 500.0000 mL | Freq: Once | INTRAVENOUS | Status: DC

## 2014-06-18 MED ORDER — DIPHENHYDRAMINE HCL 50 MG/ML IJ SOLN
25.0000 mg | Freq: Once | INTRAMUSCULAR | Status: AC
  Administered 2014-06-18: 25 mg via INTRAVENOUS

## 2014-06-18 NOTE — ED Notes (Signed)
Fetal heart tones 134, dr. Jodi Mourningzavitz notified.

## 2014-06-18 NOTE — ED Notes (Signed)
Pt made aware to return if symptoms worsen or if any life threatening symptoms occur.   

## 2014-06-18 NOTE — ED Notes (Signed)
Pt given water and graham crackers 

## 2014-06-18 NOTE — ED Notes (Signed)
Pt reports woke up this am and ate a banana like usual.  Reports 1 hour later started to break out in hives all over, itching, and face and hands feel "tight" and "full."  Denies difficulty swallowing.

## 2014-06-18 NOTE — ED Provider Notes (Signed)
CSN: [id number]     Arrival date & time 06/18/14  1144 History  This chart was scribed for Priscilla Ohara, MD by Leona Carry, ED Scribe. The patient was seen in APA03/APA03. The patient's care was started at 12:32 PM.     Chief Complaint  Patient presents with  . Allergic Reaction   The history is provided by the patient. No language interpreter was used.   HPI Comments: Priscilla Castillo is a 24 y.o. female who presents to the Emergency Department complaining of an allergic reaction that began approximately 3 hours ago. She reports that she ate a banana and broke out in hives 1 hour later. She states that the rash appeared all over her body. She reports associated itching. Patient also reports that she took ibuprofen this morning.  She reports that the rash has partially resolved since arriving at the ED.  Patient is currently 9 months pregnant. Denies abdominal pain, vaginal bleeding, or difficulty swallowing.  PCP is Dr. Leandrew Koyanagi.    History reviewed. No pertinent past medical history. Past Surgical History  Procedure Laterality Date  . Cesarean section      x 2   No family history on file. History  Substance Use Topics  . Smoking status: Former Smoker -- 0.33 packs/day    Types: Cigarettes  . Smokeless tobacco: Not on file  . Alcohol Use: No   OB History    Gravida Para Term Preterm AB TAB SAB Ectopic Multiple Living   Review of Systems  Gastrointestinal: Negative for abdominal pain.  Genitourinary: Negative for vaginal bleeding.  Skin: Positive for rash.      Allergies  Shellfish allergy  Home Medications   Prior to Admission medications   Medication Sig Start Date End Date Taking? Authorizing Provider  ferrous sulfate 325 (65 FE) MG tablet Take 325 mg by mouth daily with breakfast.   Yes Historical Provider, MD  ibuprofen (ADVIL,MOTRIN) 200 MG tablet Take 400 mg by mouth every 6 (six) hours as needed.   Yes Historical Provider, MD   BP  117/63 mmHg  Pulse 100  Resp 14  SpO2 100%  LMP 07/15/2013 Physical Exam  Constitutional: She is oriented to person, place, and time. She appears well-developed and well-nourished. No distress.  HENT:  Head: Normocephalic and atraumatic.  Mouth/Throat: No posterior oropharyngeal edema.  No tongue swelling. No angioedema.        Eyes: Conjunctivae and EOM are normal.  Neck: Neck supple. No tracheal deviation present.  Cardiovascular: Normal rate.   Pulmonary/Chest: Effort normal. No stridor. No respiratory distress.  Musculoskeletal: Normal range of motion.  Neurological: She is alert and oriented to person, place, and time.  Skin: Skin is warm and dry. Rash noted.  Mild erythema with mild excoriation and papules, mostly on the thighs.  Psychiatric: She has a normal mood and affect. Her behavior is normal.  Nursing note and vitals reviewed.   ED Course  Procedures (including critical care time) EMERGENCY DEPARTMENT [place] PREGNANCY "Study: Limited Ultrasound of the Pelvis for Pregnancy"  INDICATIONS:Pregnancy(required) Multiple views of the uterus and pelvic cavity were obtained in real-time with a multi-frequency probe.  APPROACH:Transabdominal   PERFORMED BY: Myself  IMAGES ARCHIVED?: Yes  LIMITATIONS: Body habitus  FETAL HEART RATE: 130s  CPT Codes:  78295-62 (transabdominal OB)  768-26-52 (transvaginal OB, Reduced level of service for incomplete exam)     DIAGNOSTIC STUDIES:  COORDINATION OF CARE:    Labs Review Labs Reviewed - No data to display  Imaging Review No results found.   EKG Interpretation   Date/Time:  Tuesday Jun 18 2014 11:50:40 EDT Ventricular Rate:  125 PR Interval:  148 QRS Duration: 74 QT Interval:  299 QTC Calculation: 431 R Axis:   84 Text Interpretation:  Sinus tachycardia Confirmed by Jodi MourningZAVITZ  MD, Yadriel Kerrigan  ([id number]) on 06/18/2014 1:30:37 PM      MDM   Final diagnoses:  Allergic reaction, initial encounter   Pregnant  9 month pregnant patient presents with allergic reaction type symptoms presentation. Likely secondary to bananas she did not have any other exposures today. Patient has mild rash/hives, no angioedema on exam. Discussed risk and benefits of all different medications with pregnancy. Initially decision for Benadryl and observation. Patient improving with time. Observation ER with fluids. Likely plan for close outpatient follow. Past smoker.  On recheck patient has no allergic symptoms well-appearing. Patient concerned for pregnancy, reassured patient if she has no pain no contractions no bleeding that she is stable to follow-up with her OB/GYN doctor. Bedside ultrasound showed normal fetal heart rate and fetal movement.  Results and differential diagnosis were discussed with the patient/parent/guardian. Close follow up outpatient was discussed, comfortable with the plan.   Medications  diphenhydrAMINE (BENADRYL) injection 25 mg (25 mg Intravenous Given 06/18/14 1216)    Filed Vitals:   06/18/14 1340  BP: 117/63  Pulse: 100  Resp: 14  SpO2: 100%    Final diagnoses:  Allergic reaction, initial encounter      Priscilla OharaJoshua Benigna Delisi, MD 06/18/14 1404

## 2014-06-18 NOTE — Discharge Instructions (Signed)
Use Benadryl for itching or rash. Return to the ER if you develop tongue swelling or throat swelling or difficulty breathing or new concerns. [facility] for pregnancy related concerns. Avoid bananas. If you were given medicines take as directed.  If you are on coumadin or contraceptives realize their levels and effectiveness is altered by many different medicines.  If you have any reaction (rash, tongues swelling, other) to the medicines stop taking and see a physician.    If your blood pressure was elevated in the ER make sure you follow up for management with a primary doctor or return for chest pain, shortness of breath or stroke symptoms.  Please follow up as directed and return to the ER or see a physician for new or worsening symptoms.  Thank you. There were no vitals filed for this visit.

## 2014-08-30 ENCOUNTER — Encounter (HOSPITAL_COMMUNITY): Payer: Self-pay | Admitting: Emergency Medicine

## 2014-08-30 ENCOUNTER — Emergency Department (HOSPITAL_COMMUNITY)
Admission: EM | Admit: 2014-08-30 | Discharge: 2014-08-30 | Disposition: A | Discharge status: Home / Self Care | Payer: BLUE CROSS/BLUE SHIELD | Attending: Emergency Medicine | Admitting: Emergency Medicine

## 2014-08-30 DIAGNOSIS — N739 Female pelvic inflammatory disease, unspecified: Secondary | ICD-10-CM | POA: Insufficient documentation

## 2014-08-30 DIAGNOSIS — R102 Pelvic and perineal pain: Secondary | ICD-10-CM

## 2014-08-30 DIAGNOSIS — O26899 Other specified pregnancy related conditions, unspecified trimester: Secondary | ICD-10-CM

## 2014-08-30 DIAGNOSIS — Z79899 Other long term (current) drug therapy: Secondary | ICD-10-CM | POA: Diagnosis not present

## 2014-08-30 DIAGNOSIS — Z87891 Personal history of nicotine dependence: Secondary | ICD-10-CM | POA: Diagnosis not present

## 2014-08-30 LAB — WET PREP, GENITAL
Clue Cells Wet Prep HPF POC: NONE SEEN
Trich, Wet Prep: NONE SEEN
Yeast Wet Prep HPF POC: NONE SEEN

## 2014-08-30 LAB — PREGNANCY, URINE: Preg Test, Ur: NEGATIVE

## 2014-08-30 MED ORDER — ONDANSETRON 4 MG PO TBDP
4.0000 mg | ORAL_TABLET | Freq: Once | ORAL | Status: AC
  Administered 2014-08-30: 4 mg via ORAL
  Filled 2014-08-30: qty 1

## 2014-08-30 MED ORDER — HYDROCODONE-ACETAMINOPHEN 5-325 MG PO TABS: 2.0000 | ORAL_TABLET | ORAL | Status: DC | PRN

## 2014-08-30 MED ORDER — NAPROXEN 500 MG PO TABS: 500.0000 mg | ORAL_TABLET | Freq: Two times a day (BID) | ORAL | Status: DC

## 2014-08-30 MED ORDER — HYDROCODONE-ACETAMINOPHEN 5-325 MG PO TABS
1.0000 | ORAL_TABLET | Freq: Once | ORAL | Status: AC
  Administered 2014-08-30: 1 via ORAL
  Filled 2014-08-30: qty 1

## 2014-08-30 NOTE — Discharge Instructions (Signed)
Fill the prescription for, and take your antibiotics as prescribed. Follow-up with your OB/GYN on Monday for gonorrhea and chlamydia test results.   Pelvic Inflammatory Disease Pelvic inflammatory disease (PID) is an infection in some or all of the female organs. PID can be in the uterus, ovaries, fallopian tubes, or the surrounding tissues inside the lower belly area (pelvis). HOME CARE   If given, take your antibiotic medicine as told. Finish them even if you start to feel better.  Only take medicine as told by your doctor.  Do not have sex (intercourse) until treatment is done or as told by your doctor.  Tell your sex partner if you have PID. Your partner may need to be treated.  Keep all doctor visits. GET HELP RIGHT AWAY IF:   You have a fever.  You have more belly (abdominal) or lower belly pain.  You have chills.  You have pain when you pee (urinate).  You are not better after 72 hours.  You have more fluid (discharge) coming from your vagina or fluid that is not normal.  You need pain medicine from your doctor.  You throw up (vomit).  You cannot take your medicines.  Your partner has a sexually transmitted disease (STD). MAKE SURE YOU:   Understand these instructions.  Will watch your condition.  Will get help right away if you are not doing well or get worse. Document Released: 04/09/2008 Document Revised: 05/08/2012 Document Reviewed: 01/07/2011 [facility] Patient Information 2015 [place], [place]. This information is not intended to replace advice given to you by your health care provider. Make sure you discuss any questions you have with your health care provider.  Pelvic Pain Pelvic pain is pain felt below the belly button and between your hips. It can be caused by many different things. It is important to get help right away. This is especially true for severe, sharp, or unusual pain that comes on suddenly.  HOME CARE  Only take medicine as told by your  doctor.  Rest as told by your doctor.  Eat a healthy diet, such as fruits, vegetables, and lean meats.  Drink enough fluids to keep your pee (urine) clear or pale yellow, or as told.  Avoid sex (intercourse) if it causes pain.  Apply warm or cold packs to your lower belly (abdomen). Use the type of pack that helps the pain.  Avoid situations that cause you stress.  Keep a journal to track your pain. Write down:  When the pain started.  Where it is located.  If there are things that seem to be related to the pain, such as food or your period.  Follow up with your doctor as told. GET HELP RIGHT AWAY IF:   You have heavy bleeding from the vagina.  You have more pelvic pain.  You feel lightheaded or pass out (faint).  You have chills.  You have pain when you pee or have blood in your pee.  You cannot stop having watery poop (diarrhea).  You cannot stop throwing up (vomiting).  You have a fever or lasting symptoms for more than 3 days.  You have a fever and your symptoms suddenly get worse.  You are being physically or sexually abused.  Your medicine does not help your pain.  You have fluid (discharge) coming from your vagina that is not normal. MAKE SURE YOU:  Understand these instructions.  Will watch your condition.  Will get help if you are not doing well or get worse. Document  Released: 06/30/2007 Document Revised: 07/13/2011 Document Reviewed: 05/03/2011 [facility] Patient Information 2015 [place], [place]. This information is not intended to replace advice given to you by your health care provider. Make sure you discuss any questions you have with your health care provider.

## 2014-08-30 NOTE — ED Notes (Signed)
MD at bedside. 

## 2014-08-30 NOTE — ED Notes (Signed)
Pt states that she has been having lower abd pain with vaginal discharge x2 days.  Was seen by ob/gyn yesterday and started on abx but pain is increased.

## 2014-08-30 NOTE — ED Provider Notes (Signed)
CSN: [id number]     Arrival date & time 08/30/14  1646 History   First MD Initiated Contact with Patient 08/30/14 1651     Chief Complaint  Patient presents with  . Pelvic Pain     HPI  Patient presents for evaluation of vaginal discharge and lower abdominal pain. Reports symptoms for the last week. 2 days ago seen at urgent care. Is able to obtain her records from there. They were unable to obtain a pelvic exam on her because she complained of severe pain with the speculum. This was aborted. On bimanual exam she had tenderness. The presumptively diagnosed her with PID. Given IM Rocephin. Discharged with prescriptions for Flagyl, and doxycycline.  Ultimately. She states that she was pregnant and delivered about 3 months ago. Prior to that she states her and her partner were diagnosed with gonorrhea and she was treated. Had another episode last fall where she was told she had PID. However, had 2 episodes of similar pain and was diagnosed with pelvic pain. Was asymptomatic during her pregnancy.  Seen by her GYN yesterday who encouraged her to take the prescribed medication. She has not filled it or taken it as yet.  History reviewed. No pertinent past medical history. Past Surgical History  Procedure Laterality Date  . Cesarean section      x 2   History reviewed. No pertinent family history. History  Substance Use Topics  . Smoking status: Former Smoker -- 0.33 packs/day    Types: Cigarettes  . Smokeless tobacco: Not on file  . Alcohol Use: No   OB History    Gravida Para Term Preterm AB TAB SAB Ectopic Multiple Living   0      Review of Systems  Constitutional: Negative for fever, chills, diaphoresis, appetite change and fatigue.  HENT: Negative for mouth sores, sore throat and trouble swallowing.   Eyes: Negative for visual disturbance.  Respiratory: Negative for cough, chest tightness, shortness of breath and wheezing.   Cardiovascular: Negative for chest pain.   Gastrointestinal: Negative for nausea, vomiting, abdominal pain, diarrhea and abdominal distention.  Endocrine: Negative for polydipsia, polyphagia and polyuria.  Genitourinary: Positive for vaginal discharge and pelvic pain. Negative for dysuria, frequency and hematuria.  Musculoskeletal: Negative for gait problem.  Skin: Negative for color change, pallor and rash.  Neurological: Negative for dizziness, syncope, light-headedness and headaches.  Hematological: Does not bruise/bleed easily.  Psychiatric/Behavioral: Negative for behavioral problems and confusion.      Allergies  Shellfish allergy and Banana  Home Medications   Prior to Admission medications   Medication Sig Start Date End Date Taking? Authorizing Provider  ibuprofen (ADVIL,MOTRIN) 200 MG tablet Take 400 mg by mouth every 6 (six) hours as needed for mild pain or moderate pain.    Yes Historical Provider, MD  NIFEdipine (PROCARDIA-XL/ADALAT-CC/NIFEDICAL-XL) 30 MG 24 hr tablet Take 30 mg by mouth daily.   Yes Historical Provider, MD  HYDROcodone-acetaminophen (NORCO/VICODIN) 5-325 MG per tablet Take 2 tablets by mouth every 4 (four) hours as needed. 08/30/14   Rolland Porter, MD  naproxen (NAPROSYN) 500 MG tablet Take 1 tablet (500 mg total) by mouth 2 (two) times daily. 08/30/14   Rolland Porter, MD   BP 145/93 mmHg  Pulse 77  Temp(Src) 98.5 F (36.9 C) (Oral)  Resp 17  Ht  (1.549 m)  Wt 209 lb 7 oz (95 kg)  BMI 39.59 kg/m2  SpO2 100%  LMP 08/23/2014  Breastfeeding? Unknown Physical Exam  Constitutional: She is oriented to person, place, and time. She appears well-developed and well-nourished. No distress.  HENT:  Head: Normocephalic.  Eyes: Conjunctivae are normal. Pupils are equal, round, and reactive to light. No scleral icterus.  Neck: Normal range of motion. Neck supple. No thyromegaly present.  Cardiovascular: Normal rate and regular rhythm.  Exam reveals no gallop and no friction rub.   No murmur  heard. Pulmonary/Chest: Effort normal and breath sounds normal. No respiratory distress. She has no wheezes. She has no rales.  Abdominal: Soft. Bowel sounds are normal. She exhibits no distension. There is no tenderness. There is no rebound.  Genitourinary:  Has a very prominent low-lying pubic bone. Has some discomfort with pelvic exam.. Tetracycline was used. Cervix does show minimal erythema. She complains of diffuse pelvic tenderness. No frank chandelier sign. Some thin yellow discharge. Swab obtained for GC, chlamydia, and wet prep.  Musculoskeletal: Normal range of motion.  Neurological: She is alert and oriented to person, place, and time.  Skin: Skin is warm and dry. No rash noted.  Psychiatric: She has a normal mood and affect. Her behavior is normal.    ED Course  Procedures (including critical care time) Labs Review Labs Reviewed  WET PREP, GENITAL - Abnormal; Notable for the following:    WBC, Wet Prep HPF POC RARE (*)    All other components within normal limits  PREGNANCY, URINE  GC/CHLAMYDIA PROBE AMP ([place]) NOT AT [facility]    Imaging Review No results found.   EKG Interpretation None      MDM   Final diagnoses:  Pelvic pain affecting pregnancy  Pelvic inflammatory disease    At this point her differential includes recurrent chronic pelvic pain versus pelvic inflammatory disease. She's been given Rocephin 2 days ago. Has prescriptions for doxycycline and Flagyl. Encouraged her to fill and take her prescriptions.  Follow up with her primary OB/GYN doctor for ongoing care. I discussed this with her at length. Recurrent chronic pelvic pain would be a different realm of diagnostic considerations.    Rolland Porter, MD 08/30/14 [phone]

## 2014-09-02 LAB — GC/CHLAMYDIA PROBE AMP (~~LOC~~) NOT AT ARMC
Chlamydia: NEGATIVE
Neisseria Gonorrhea: NEGATIVE

## 2015-06-11 ENCOUNTER — Emergency Department (HOSPITAL_COMMUNITY)
Admission: EM | Admit: 2015-06-11 | Discharge: 2015-06-11 | Disposition: A | Discharge status: Home / Self Care | Payer: BLUE CROSS/BLUE SHIELD | Attending: Emergency Medicine | Admitting: Emergency Medicine

## 2015-06-11 ENCOUNTER — Encounter (HOSPITAL_COMMUNITY): Payer: Self-pay

## 2015-06-11 DIAGNOSIS — Y929 Unspecified place or not applicable: Secondary | ICD-10-CM | POA: Diagnosis not present

## 2015-06-11 DIAGNOSIS — S90414A Abrasion, right lesser toe(s), initial encounter: Secondary | ICD-10-CM | POA: Insufficient documentation

## 2015-06-11 DIAGNOSIS — Y939 Activity, unspecified: Secondary | ICD-10-CM | POA: Diagnosis not present

## 2015-06-11 DIAGNOSIS — Y999 Unspecified external cause status: Secondary | ICD-10-CM | POA: Insufficient documentation

## 2015-06-11 DIAGNOSIS — X58XXXA Exposure to other specified factors, initial encounter: Secondary | ICD-10-CM | POA: Diagnosis not present

## 2015-06-11 DIAGNOSIS — L089 Local infection of the skin and subcutaneous tissue, unspecified: Secondary | ICD-10-CM | POA: Diagnosis not present

## 2015-06-11 DIAGNOSIS — S90416A Abrasion, unspecified lesser toe(s), initial encounter: Secondary | ICD-10-CM

## 2015-06-11 DIAGNOSIS — F17210 Nicotine dependence, cigarettes, uncomplicated: Secondary | ICD-10-CM | POA: Diagnosis not present

## 2015-06-11 DIAGNOSIS — S99921A Unspecified injury of right foot, initial encounter: Secondary | ICD-10-CM | POA: Diagnosis present

## 2015-06-11 MED ORDER — IBUPROFEN 800 MG PO TABS
800.0000 mg | ORAL_TABLET | Freq: Once | ORAL | Status: DC
  Filled 2015-06-11: qty 1

## 2015-06-11 MED ORDER — DOXYCYCLINE HYCLATE 100 MG PO TABS
100.0000 mg | ORAL_TABLET | Freq: Once | ORAL | Status: AC
  Administered 2015-06-11: 100 mg via ORAL
  Filled 2015-06-11: qty 1

## 2015-06-11 MED ORDER — TOLNAFTATE 1 % EX CREA: 1.0000 "application " | TOPICAL_CREAM | Freq: Two times a day (BID) | CUTANEOUS | Status: DC

## 2015-06-11 MED ORDER — DOXYCYCLINE HYCLATE 100 MG PO CAPS: 100.0000 mg | ORAL_CAPSULE | Freq: Two times a day (BID) | ORAL | Status: DC

## 2015-06-11 NOTE — ED Provider Notes (Signed)
CSN: [id number]     Arrival date & time 06/11/15  1555 History   First MD Initiated Contact with Patient 06/11/15 1608     Chief Complaint  Patient presents with  . Toe Pain     (Consider location/radiation/quality/duration/timing/severity/associated sxs/prior Treatment) Patient is a 25 y.o. female presenting with toe pain. The history is provided by the patient.  Toe Pain This is a new problem. The current episode started in the past 7 days. The problem occurs intermittently. The problem has been gradually worsening. Pertinent negatives include no abdominal pain, arthralgias, chest pain, coughing, fever, nausea, neck pain or numbness. The symptoms are aggravated by walking and standing. Treatments tried: diaper rash cream. The treatment provided mild relief.    History reviewed. No pertinent past medical history. Past Surgical History  Procedure Laterality Date  . Cesarean section      x 2   No family history on file. Social History  Substance Use Topics  . Smoking status: Current Every Day Smoker -- 0.33 packs/day    Types: Cigarettes  . Smokeless tobacco: None  . Alcohol Use: No   OB History    Gravida Para Term Preterm AB TAB SAB Ectopic Multiple Living   6 3 3  2  2   0      Review of Systems  Constitutional: Negative for fever and activity change.       All ROS Neg except as noted in HPI  HENT: Negative for nosebleeds.   Eyes: Negative for photophobia and discharge.  Respiratory: Negative for cough, shortness of breath and wheezing.   Cardiovascular: Negative for chest pain and palpitations.  Gastrointestinal: Negative for nausea, abdominal pain and blood in stool.  Genitourinary: Negative for dysuria, frequency and hematuria.  Musculoskeletal: Negative for back pain, arthralgias and neck pain.  Skin: Positive for wound.  Neurological: Negative for dizziness, seizures, speech difficulty and numbness.  Psychiatric/Behavioral: Negative for hallucinations and  confusion.      Allergies  Shellfish allergy; Banana; and Latex  Home Medications   Prior to Admission medications   Medication Sig Start Date End Date Taking? Authorizing Provider  doxycycline (VIBRAMYCIN) 100 MG capsule Take 1 capsule (100 mg total) by mouth 2 (two) times daily. 06/11/15   Ivery QualeHobson Jasmin Winberry, PA-C  HYDROcodone-acetaminophen (NORCO/VICODIN) 5-325 MG per tablet Take 2 tablets by mouth every 4 (four) hours as needed. 08/30/14   Rolland PorterMark James, MD  ibuprofen (ADVIL,MOTRIN) 200 MG tablet Take 400 mg by mouth every 6 (six) hours as needed for mild pain or moderate pain.     Historical Provider, MD  naproxen (NAPROSYN) 500 MG tablet Take 1 tablet (500 mg total) by mouth 2 (two) times daily. 08/30/14   Rolland PorterMark James, MD  NIFEdipine (PROCARDIA-XL/ADALAT-CC/NIFEDICAL-XL) 30 MG 24 hr tablet Take 30 mg by mouth daily.    Historical Provider, MD  tolnaftate (TINACTIN) 1 % cream Apply 1 application topically 2 (two) times daily. 06/11/15   Ivery QualeHobson Rithwik Schmieg, PA-C   BP 147/95 mmHg  Pulse 93  Temp(Src) 98.6 F (37 C) (Oral)  Resp 18  Ht 5\' 1"  (1.549 m)  Wt 95.255 kg  BMI 39.70 kg/m2  SpO2 100%  LMP 05/28/2015  Breastfeeding? No Physical Exam  Constitutional: She is oriented to person, place, and time. She appears well-developed and well-nourished.  Non-toxic appearance.  HENT:  Head: Normocephalic.  Right Ear: Tympanic membrane and external ear normal.  Left Ear: Tympanic membrane and external ear normal.  Eyes: EOM and lids are normal. Pupils are  equal, round, and reactive to light.  Neck: Normal range of motion. Neck supple. Carotid bruit is not present.  Cardiovascular: Normal rate, regular rhythm, normal heart sounds, intact distal pulses and normal pulses.   Pulmonary/Chest: Breath sounds normal. No respiratory distress.  Abdominal: Soft. Bowel sounds are normal. There is no tenderness. There is no guarding.  Musculoskeletal: Normal range of motion.  There is an open draining area in the  web space between the fourth and fifth right foot. There no red streaks appreciated. There is soreness of the right fifth toe. Dorsalis pedis pulses 2+ bilaterally.  Lymphadenopathy:       Head (right side): No submandibular adenopathy present.       Head (left side): No submandibular adenopathy present.    She has no cervical adenopathy.  Neurological: She is alert and oriented to person, place, and time. She has normal strength. No cranial nerve deficit or sensory deficit.  Skin: Skin is warm and dry.  Psychiatric: She has a normal mood and affect. Her speech is normal.  Nursing note and vitals reviewed.   ED Course  Procedures (including critical care time) Labs Review Labs Reviewed - No data to display  Imaging Review No results found. I have personally reviewed and evaluated these images and lab results as part of my medical decision-making.   EKG Interpretation None      MDM  Patient has a small cut between the fourth and fifth toe. It appears that the patient may have had an issue with athlete's foot and developed a secondary infection on. The patient states that she does not remember any trauma to the toe, but states she could have hit her toe on a piece of furniture. The patient will be treated with Tinactin and doxycycline. I've asked her to use Tylenol or ibuprofen for soreness if needed. The patient is in agreement with this discharge plan. She will see her primary physician or return to the emergency department if not improving.    Final diagnoses:  Infected abrasion of toe, unspecified laterality, initial encounter    **I have reviewed nursing notes, vital signs, and all appropriate lab and imaging results for this patient.Ivery Quale, PA-C 06/11/15 1638  Samuel Jester, DO 06/12/15 2032

## 2015-06-11 NOTE — ED Notes (Signed)
Pt reports sore between little toe and 4th toe on r foot for the past few days.  Pt says her feet get dry and crack but usually will heal without any problem.  Pt has small cut to the area. Reports her feet have been swelling the past few days as well.

## 2015-06-11 NOTE — Discharge Instructions (Signed)
Please cleanse the area between your toe with soap and water, or soak in warm Epsom salt water for 10 minutes. Please dry thoroughly. Apply Tinactin cream 2 times daily. Use doxycycline 2 times daily with food. Please see Dr.Burdine for follow-up and recheck.

## 2017-06-26 ENCOUNTER — Emergency Department (HOSPITAL_COMMUNITY)
Admission: EM | Admit: 2017-06-26 | Discharge: 2017-06-26 | Disposition: A | Discharge status: Home / Self Care | Payer: Medicaid Other | Attending: Emergency Medicine | Admitting: Emergency Medicine

## 2017-06-26 ENCOUNTER — Other Ambulatory Visit: Payer: Self-pay

## 2017-06-26 ENCOUNTER — Encounter (HOSPITAL_COMMUNITY): Payer: Self-pay | Admitting: *Deleted

## 2017-06-26 DIAGNOSIS — Z202 Contact with and (suspected) exposure to infections with a predominantly sexual mode of transmission: Secondary | ICD-10-CM

## 2017-06-26 DIAGNOSIS — Z79899 Other long term (current) drug therapy: Secondary | ICD-10-CM | POA: Diagnosis not present

## 2017-06-26 DIAGNOSIS — F17210 Nicotine dependence, cigarettes, uncomplicated: Secondary | ICD-10-CM | POA: Diagnosis not present

## 2017-06-26 DIAGNOSIS — Z91040 Latex allergy status: Secondary | ICD-10-CM | POA: Diagnosis not present

## 2017-06-26 DIAGNOSIS — I10 Essential (primary) hypertension: Secondary | ICD-10-CM | POA: Insufficient documentation

## 2017-06-26 HISTORY — DX: Essential (primary) hypertension: I10

## 2017-06-26 LAB — WET PREP, GENITAL
Clue Cells Wet Prep HPF POC: NONE SEEN
Sperm: NONE SEEN
Trich, Wet Prep: NONE SEEN
Yeast Wet Prep HPF POC: NONE SEEN

## 2017-06-26 LAB — URINALYSIS, ROUTINE W REFLEX MICROSCOPIC
Bacteria, UA: NONE SEEN
Glucose, UA: NEGATIVE mg/dL
Hgb urine dipstick: NEGATIVE
Ketones, ur: 5 mg/dL — AB
Leukocytes, UA: NEGATIVE
Nitrite: NEGATIVE
Protein, ur: 30 mg/dL — AB
Specific Gravity, Urine: 1.039 — ABNORMAL HIGH (ref 1.005–1.030)
pH: 6 (ref 5.0–8.0)

## 2017-06-26 LAB — RAPID HIV SCREEN (HIV 1/2 AB+AG)
HIV 1/2 Antibodies: NONREACTIVE
HIV-1 P24 Antigen - HIV24: NONREACTIVE

## 2017-06-26 LAB — POC URINE PREG, ED: Preg Test, Ur: NEGATIVE

## 2017-06-26 MED ORDER — LIDOCAINE HCL (PF) 1 % IJ SOLN
INTRAMUSCULAR | Status: AC
  Administered 2017-06-26: 0.9 mL
  Filled 2017-06-26: qty 5

## 2017-06-26 MED ORDER — AZITHROMYCIN 250 MG PO TABS
1000.0000 mg | ORAL_TABLET | Freq: Once | ORAL | Status: AC
  Administered 2017-06-26: 1000 mg via ORAL
  Filled 2017-06-26: qty 4

## 2017-06-26 MED ORDER — ONDANSETRON 4 MG PO TBDP
ORAL_TABLET | ORAL | Status: AC
  Filled 2017-06-26: qty 1

## 2017-06-26 MED ORDER — ONDANSETRON 4 MG PO TBDP
4.0000 mg | ORAL_TABLET | Freq: Once | ORAL | Status: AC
  Administered 2017-06-26: 4 mg via ORAL

## 2017-06-26 MED ORDER — CEFTRIAXONE SODIUM 250 MG IJ SOLR
250.0000 mg | Freq: Once | INTRAMUSCULAR | Status: AC
  Administered 2017-06-26: 250 mg via INTRAMUSCULAR
  Filled 2017-06-26: qty 250

## 2017-06-26 NOTE — Discharge Instructions (Addendum)
You have been treated for both gonorrhea and chlamydia today with the medicines you received.  Your lab tests that have resulted so far are negative. The tests that have not resulted, including gonorrhea, chlamydia and syphilis should be resulted over the next 2-3 days.  As discussed, your partner needs to be informed and treated for std's.  Avoid sex until your symptoms have resolved and your partner has been treated.

## 2017-06-26 NOTE — ED Triage Notes (Signed)
Female sexual partner informed pt that he had dx with gonorrhea.  Pt vaginal discharge that is white in color, + odor.

## 2017-06-26 NOTE — ED Provider Notes (Signed)
[facility] EMERGENCY DEPARTMENT Provider Note   CSN: [id number] Arrival date & time: 06/26/17  1612     History   Chief Complaint Chief Complaint  Patient presents with  . Exposure to STD    HPI Priscilla Castillo is a 27 y.o. female presenting for evaluation of std exposure. She was informed by her partner's prior partner that she was diagnosed with gonorrhea.  She endorses having white vaginal discharge and positive odor occurring for approximately the past week.  She denies fevers, chills, abdominal or pelvic pain and has had no n/v or other complaints, denies dysuria or increased urinary frequency.  She has had no treatment prior to arrival.  HPI  Past Medical History:  Diagnosis Date  . Hypertension     There are no active problems to display for this patient.   Past Surgical History:  Procedure Laterality Date  . CESAREAN SECTION     x 2     OB History    Gravida  6   Para  3   Term  3   Preterm      AB  2   Living        SAB  2   TAB      Ectopic      Multiple  0   Live Births               Home Medications    Prior to Admission medications   Medication Sig Start Date End Date Taking? Authorizing Provider  doxycycline (VIBRAMYCIN) 100 MG capsule Take 1 capsule (100 mg total) by mouth 2 (two) times daily. 06/11/15   Ivery QualeBryant, Hobson, PA-C  HYDROcodone-acetaminophen (NORCO/VICODIN) 5-325 MG per tablet Take 2 tablets by mouth every 4 (four) hours as needed. 08/30/14   Rolland PorterJames, Mark, MD  ibuprofen (ADVIL,MOTRIN) 200 MG tablet Take 400 mg by mouth every 6 (six) hours as needed for mild pain or moderate pain.     [provider]  naproxen (NAPROSYN) 500 MG tablet Take 1 tablet (500 mg total) by mouth 2 (two) times daily. 08/30/14   Rolland PorterJames, Mark, MD  NIFEdipine (PROCARDIA-XL/ADALAT-CC/NIFEDICAL-XL) 30 MG 24 hr tablet Take 30 mg by mouth daily.    [provider]  tolnaftate (TINACTIN) 1 % cream Apply 1 application topically 2 (two)  times daily. 06/11/15   Ivery QualeBryant, Hobson, PA-C    Family History History reviewed. No pertinent family history.  Social History Social History   Tobacco Use  . Smoking status: Current Every Day Smoker    Packs/day: 0.33    Types: Cigarettes  . Smokeless tobacco: Never Used  Substance Use Topics  . Alcohol use: Yes    Comment: occ.   . Drug use: Not Currently    Types: Marijuana     Allergies   Shellfish allergy; Banana; and Latex   Review of Systems Review of Systems  Constitutional: Negative for chills and fever.  HENT: Negative for congestion and sore throat.   Eyes: Negative.   Respiratory: Negative for chest tightness and shortness of breath.   Cardiovascular: Negative for chest pain.  Gastrointestinal: Negative for abdominal pain, nausea and vomiting.  Genitourinary: Positive for vaginal discharge. Negative for flank pain and pelvic pain.  Musculoskeletal: Negative for arthralgias, joint swelling and neck pain.  Skin: Negative.  Negative for rash and wound.  Neurological: Negative for dizziness, weakness, light-headedness, numbness and headaches.  Psychiatric/Behavioral: Negative.      Physical Exam Updated Vital Signs BP 139/78 (BP  Location: Left Arm)   Pulse 73   Temp 98.7 F (37.1 C) (Oral)   Resp 16   Ht 5\' 1"  (1.549 m)   Wt 95.3 kg (210 lb)   LMP  (LMP Unknown)   SpO2 100%   BMI 39.68 kg/m   Physical Exam  Constitutional: She appears well-developed and well-nourished.  HENT:  Head: Normocephalic and atraumatic.  Eyes: Conjunctivae are normal.  Neck: Normal range of motion.  Cardiovascular: Normal rate, regular rhythm, normal heart sounds and intact distal pulses.  Pulmonary/Chest: Effort normal and breath sounds normal. She has no wheezes.  Abdominal: Soft. Bowel sounds are normal. There is no tenderness.  Musculoskeletal: Normal range of motion.  Neurological: She is alert.  Skin: Skin is warm and dry.  Psychiatric: She has a normal mood and  affect.  Nursing note and vitals reviewed.    ED Treatments / Results  Labs (all labs ordered are listed, but only abnormal results are displayed) Labs Reviewed  WET PREP, GENITAL - Abnormal; Notable for the following components:      Result Value   WBC, Wet Prep HPF POC FEW (*)    All other components within normal limits  URINALYSIS, ROUTINE W REFLEX MICROSCOPIC - Abnormal; Notable for the following components:   Specific Gravity, Urine 1.039 (*)    Bilirubin Urine SMALL (*)    Ketones, ur 5 (*)    Protein, ur 30 (*)    All other components within normal limits  RAPID HIV SCREEN (HIV 1/2 AB+AG)  RPR  POC URINE PREG, ED  POC URINE PREG, ED  GC/CHLAMYDIA PROBE AMP ([place]) NOT AT [facility]    EKG None  Radiology No results found.  Procedures Procedures (including critical care time)  Medications Ordered in ED Medications  cefTRIAXone (ROCEPHIN) injection 250 mg (250 mg Intramuscular Given 06/26/17 1726)  azithromycin (ZITHROMAX) tablet 1,000 mg (1,000 mg Oral Given 06/26/17 1727)  lidocaine (PF) (XYLOCAINE) 1 % injection (0.9 mLs  Given 06/26/17 1725)  ondansetron (ZOFRAN-ODT) disintegrating tablet 4 mg (4 mg Oral Given 06/26/17 1836)     Initial Impression / Assessment and Plan / ED Course  I have reviewed the triage vital signs and the nursing notes.  Pertinent labs & imaging results that were available during my care of the patient were reviewed by me and considered in my medical decision making (see chart for details).     Patient treated for gonorrhea and chlamydia here.  Other labs are currently pending.  Discussed she will need to advise her partner of his need for testing and treatment.  Advised follow-up with her PCP for any persistent or worsening symptoms.  Discussed safe sex practices and to avoid sex until her symptoms have resolved and her partner has been treated and is symptom-free as well.  Patient understands plan.  Final Clinical Impressions(s) / ED  Diagnoses   Final diagnoses:  STD exposure    ED Discharge Orders    None       Victoriano Lain 06/26/17 1842    Raeford Razor, MD 06/29/17 1257

## 2017-06-26 NOTE — ED Triage Notes (Signed)
Pt called not in WR. 

## 2017-06-27 LAB — GC/CHLAMYDIA PROBE AMP (~~LOC~~) NOT AT ARMC
Chlamydia: POSITIVE — AB
Neisseria Gonorrhea: NEGATIVE

## 2017-06-28 LAB — RPR: RPR Ser Ql: NONREACTIVE

## 2017-07-08 ENCOUNTER — Emergency Department (HOSPITAL_COMMUNITY)
Admission: EM | Admit: 2017-07-08 | Discharge: 2017-07-08 | Disposition: A | Discharge status: Home / Self Care | Payer: Medicaid Other | Attending: Emergency Medicine | Admitting: Emergency Medicine

## 2017-07-08 ENCOUNTER — Other Ambulatory Visit: Payer: Self-pay

## 2017-07-08 ENCOUNTER — Emergency Department (HOSPITAL_COMMUNITY): Payer: Medicaid Other

## 2017-07-08 DIAGNOSIS — N730 Acute parametritis and pelvic cellulitis: Secondary | ICD-10-CM | POA: Diagnosis not present

## 2017-07-08 DIAGNOSIS — F17210 Nicotine dependence, cigarettes, uncomplicated: Secondary | ICD-10-CM | POA: Insufficient documentation

## 2017-07-08 DIAGNOSIS — I10 Essential (primary) hypertension: Secondary | ICD-10-CM | POA: Insufficient documentation

## 2017-07-08 DIAGNOSIS — A599 Trichomoniasis, unspecified: Secondary | ICD-10-CM | POA: Diagnosis not present

## 2017-07-08 DIAGNOSIS — Z79899 Other long term (current) drug therapy: Secondary | ICD-10-CM | POA: Diagnosis not present

## 2017-07-08 DIAGNOSIS — R102 Pelvic and perineal pain: Secondary | ICD-10-CM | POA: Diagnosis present

## 2017-07-08 LAB — COMPREHENSIVE METABOLIC PANEL
ALT: 10 U/L — ABNORMAL LOW (ref 14–54)
AST: 12 U/L — ABNORMAL LOW (ref 15–41)
Albumin: 3.4 g/dL — ABNORMAL LOW (ref 3.5–5.0)
Alkaline Phosphatase: 50 U/L (ref 38–126)
Anion gap: 6 (ref 5–15)
BUN: 12 mg/dL (ref 6–20)
CO2: 26 mmol/L (ref 22–32)
Calcium: 8.9 mg/dL (ref 8.9–10.3)
Chloride: 106 mmol/L (ref 101–111)
Creatinine, Ser: 1.11 mg/dL — ABNORMAL HIGH (ref 0.44–1.00)
GFR calc Af Amer: >60 mL/min (ref >60)
GFR calc non Af Amer: >60 mL/min (ref >60)
Glucose, Bld: 86 mg/dL (ref 65–99)
Potassium: 4 mmol/L (ref 3.5–5.1)
Sodium: 138 mmol/L (ref 135–145)
Total Bilirubin: 0.5 mg/dL (ref 0.3–1.2)
Total Protein: 6.5 g/dL (ref 6.5–8.1)

## 2017-07-08 LAB — CBC
HCT: 39.7 % (ref 36.0–46.0)
Hemoglobin: 12.4 g/dL (ref 12.0–15.0)
MCH: 27.5 pg (ref 26.0–34.0)
MCHC: 31.2 g/dL (ref 30.0–36.0)
MCV: 88 fL (ref 78.0–100.0)
Platelets: 212 10*3/uL (ref 150–400)
RBC: 4.51 MIL/uL (ref 3.87–5.11)
RDW: 14 % (ref 11.5–15.5)
WBC: 10.3 10*3/uL (ref 4.0–10.5)

## 2017-07-08 LAB — LIPASE, BLOOD: Lipase: 34 U/L (ref 11–51)

## 2017-07-08 LAB — WET PREP, GENITAL
Clue Cells Wet Prep HPF POC: NONE SEEN
Sperm: NONE SEEN
Yeast Wet Prep HPF POC: NONE SEEN

## 2017-07-08 LAB — URINALYSIS, ROUTINE W REFLEX MICROSCOPIC
Bilirubin Urine: NEGATIVE
Glucose, UA: NEGATIVE mg/dL
Hgb urine dipstick: NEGATIVE
Ketones, ur: NEGATIVE mg/dL
Leukocytes, UA: NEGATIVE
Nitrite: NEGATIVE
Protein, ur: NEGATIVE mg/dL
Specific Gravity, Urine: 1.021 (ref 1.005–1.030)
pH: 6 (ref 5.0–8.0)

## 2017-07-08 MED ORDER — ONDANSETRON HCL 4 MG/2ML IJ SOLN
4.0000 mg | Freq: Once | INTRAMUSCULAR | Status: AC
  Administered 2017-07-08: 4 mg via INTRAVENOUS
  Filled 2017-07-08: qty 2

## 2017-07-08 MED ORDER — IBUPROFEN 800 MG PO TABS: 800.0000 mg | ORAL_TABLET | Freq: Three times a day (TID) | ORAL | 0 refills | Status: DC

## 2017-07-08 MED ORDER — MORPHINE SULFATE (PF) 4 MG/ML IV SOLN
4.0000 mg | Freq: Once | INTRAVENOUS | Status: AC
  Administered 2017-07-08: 4 mg via INTRAVENOUS
  Filled 2017-07-08: qty 1

## 2017-07-08 MED ORDER — METRONIDAZOLE 500 MG PO TABS: 500.0000 mg | ORAL_TABLET | Freq: Two times a day (BID) | ORAL | 0 refills | Status: DC

## 2017-07-08 MED ORDER — DOXYCYCLINE HYCLATE 100 MG PO CAPS: 100.0000 mg | ORAL_CAPSULE | Freq: Two times a day (BID) | ORAL | 0 refills | Status: DC

## 2017-07-08 MED ORDER — HYDROCODONE-ACETAMINOPHEN 5-325 MG PO TABS: 1.0000 | ORAL_TABLET | ORAL | 0 refills | Status: DC | PRN

## 2017-07-08 NOTE — ED Triage Notes (Signed)
Pt reports lower abd pain onset 1 hour ago, denies n/v/d

## 2017-07-08 NOTE — ED Notes (Signed)
POC test is negative

## 2017-07-08 NOTE — ED Provider Notes (Signed)
[facility] Provider Note   CSN: [id number] Arrival date & time: 07/08/17  0258     History   Chief Complaint Chief Complaint  Patient presents with  . Abdominal Pain    HPI Lavella LemonsMarquise D Moose is a 27 y.o. female.  Patient presents to the emergency department for evaluation of lower abdominal and pelvic pain.  She reports bilateral, sharp and stabbing pain.  She reports that this pain has been intermittent recently, has sought medical care several times in the past.  She has not identified a cause.  Patient has not had any urinary symptoms.  Pain is worsened by walking or moving around.  No associated nausea, vomiting, diarrhea.     Past Medical History:  Diagnosis Date  . Hypertension     There are no active problems to display for this patient.   Past Surgical History:  Procedure Laterality Date  . CESAREAN SECTION     x 2     OB History    Gravida  6   Para  3   Term  3   Preterm      AB  2   Living        SAB  2   TAB      Ectopic      Multiple  0   Live Births               Home Medications    Prior to Admission medications   Medication Sig Start Date End Date Taking? Authorizing Provider  doxycycline (VIBRAMYCIN) 100 MG capsule Take 1 capsule (100 mg total) by mouth 2 (two) times daily. 06/11/15   Ivery QualeBryant, Hobson, PA-C  HYDROcodone-acetaminophen (NORCO/VICODIN) 5-325 MG per tablet Take 2 tablets by mouth every 4 (four) hours as needed. 08/30/14   Rolland PorterJames, Mark, MD  ibuprofen (ADVIL,MOTRIN) 200 MG tablet Take 400 mg by mouth every 6 (six) hours as needed for mild pain or moderate pain.     [provider]  naproxen (NAPROSYN) 500 MG tablet Take 1 tablet (500 mg total) by mouth 2 (two) times daily. 08/30/14   Rolland PorterJames, Mark, MD  NIFEdipine (PROCARDIA-XL/ADALAT-CC/NIFEDICAL-XL) 30 MG 24 hr tablet Take 30 mg by mouth daily.    [provider]  tolnaftate (TINACTIN) 1 % cream Apply 1 application topically 2  (two) times daily. 06/11/15   Ivery QualeBryant, Hobson, PA-C    Family History No family history on file.  Social History Social History   Tobacco Use  . Smoking status: Current Every Day Smoker    Packs/day: 0.33    Types: Cigarettes  . Smokeless tobacco: Never Used  Substance Use Topics  . Alcohol use: Yes    Comment: occ.   . Drug use: Not Currently    Types: Marijuana     Allergies   Shellfish allergy; Banana; and Latex   Review of Systems Review of Systems  Gastrointestinal: Positive for abdominal pain.  Genitourinary: Positive for pelvic pain.  All other systems reviewed and are negative.    Physical Exam Updated Vital Signs BP 135/90 (BP Location: Left Arm)   Pulse 80   Temp 98.8 F (37.1 C) (Oral)   Resp 16   Ht 5\' 1"  (1.549 m)   Wt 97.5 kg (215 lb)   LMP 06/12/2017 (Approximate)   SpO2 100%   BMI 40.62 kg/m   Physical Exam  Constitutional: She is oriented to person, place, and time. She appears well-developed and well-nourished. No distress.  HENT:  Head: Normocephalic and atraumatic.  Right Ear: Hearing normal.  Left Ear: Hearing normal.  Nose: Nose normal.  Mouth/Throat: Oropharynx is clear and moist and mucous membranes are normal.  Eyes: Pupils are equal, round, and reactive to light. Conjunctivae and EOM are normal.  Neck: Normal range of motion. Neck supple.  Cardiovascular: Regular rhythm, S1 normal and S2 normal. Exam reveals no gallop and no friction rub.  No murmur heard. Pulmonary/Chest: Effort normal and breath sounds normal. No respiratory distress. She exhibits no tenderness.  Abdominal: Soft. Normal appearance and bowel sounds are normal. There is no hepatosplenomegaly. There is no tenderness. There is no rebound, no guarding, no tenderness at McBurney's point and negative Murphy's sign. No hernia.  Genitourinary: There is lesion (Ulcerated area right upper labia minora/clitoral hood) on the right labia. Cervix exhibits motion tenderness and  discharge.  Musculoskeletal: Normal range of motion.  Neurological: She is alert and oriented to person, place, and time. She has normal strength. No cranial nerve deficit or sensory deficit. Coordination normal. GCS eye subscore is 4. GCS verbal subscore is 5. GCS motor subscore is 6.  Skin: Skin is warm, dry and intact. No rash noted. No cyanosis.  Psychiatric: She has a normal mood and affect. Her speech is normal and behavior is normal. Thought content normal.  Nursing note and vitals reviewed.    ED Treatments / Results  Labs (all labs ordered are listed, but only abnormal results are displayed) Labs Reviewed  WET PREP, GENITAL - Abnormal; Notable for the following components:      Result Value   Trich, Wet Prep PRESENT (*)    WBC, Wet Prep HPF POC FEW (*)    All other components within normal limits  COMPREHENSIVE METABOLIC PANEL - Abnormal; Notable for the following components:   Creatinine, Ser 1.11 (*)    Albumin 3.4 (*)    AST 12 (*)    ALT 10 (*)    All other components within normal limits  URINALYSIS, ROUTINE W REFLEX MICROSCOPIC - Abnormal; Notable for the following components:   APPearance HAZY (*)    All other components within normal limits  HSV CULTURE AND TYPING  LIPASE, BLOOD  CBC  POC URINE PREG, ED  GC/CHLAMYDIA PROBE AMP ([place]) NOT AT [facility]    EKG None  Radiology No results found.  Procedures Procedures (including critical care time)  Medications Ordered in ED Medications  morphine 4 MG/ML injection 4 mg (4 mg Intravenous Given 07/08/17 0502)  ondansetron (ZOFRAN) injection 4 mg (4 mg Intravenous Given 07/08/17 0500)     Initial Impression / Assessment and Plan / ED Course  I have reviewed the triage vital signs and the nursing notes.  Pertinent labs & imaging results that were available during my care of the patient were reviewed by me and considered in my medical decision making (see chart for details).     Patient presents to the  ER for evaluation of progressively worsening pelvic pain.  She was seen 2 weeks ago after STD exposure.  At that time she had no symptoms.  She was treated with Rocephin and Zithromax.  Subsequent testing did come back positive for chlamydia.  I suspect that the chlamydia did not respond to the single dose of Zithromax and she now is experiencing PID.  Examination is very consistent with PID.  Her gonorrhea was negative, she will need treatment with 14-day course of doxycycline.  Will order ultrasound to rule out tubo-ovarian abscess.  Will  sign out to oncoming ER physician.  If ultrasound is unremarkable, discharged with doxycycline and analgesia, she sees [facility] in [place].  She needs to follow-up with them available appointment.  Final Clinical Impressions(s) / ED Diagnoses   Final diagnoses:  Pelvic pain  PID (acute pelvic inflammatory disease)    ED Discharge Orders    None       Gilda Crease, MD 07/08/17 [phone]

## 2017-07-09 LAB — GC/CHLAMYDIA PROBE AMP (~~LOC~~) NOT AT ARMC
Chlamydia: NEGATIVE
Neisseria Gonorrhea: NEGATIVE

## 2017-07-11 LAB — HSV CULTURE AND TYPING

## 2017-07-12 ENCOUNTER — Encounter (HOSPITAL_COMMUNITY): Payer: Self-pay | Admitting: Emergency Medicine

## 2017-07-12 ENCOUNTER — Emergency Department (HOSPITAL_COMMUNITY): Payer: Medicaid Other

## 2017-07-12 ENCOUNTER — Other Ambulatory Visit: Payer: Self-pay

## 2017-07-12 ENCOUNTER — Emergency Department (HOSPITAL_COMMUNITY)
Admission: EM | Admit: 2017-07-12 | Discharge: 2017-07-12 | Disposition: A | Discharge status: Home / Self Care | Payer: Medicaid Other | Attending: Emergency Medicine | Admitting: Emergency Medicine

## 2017-07-12 DIAGNOSIS — Y9389 Activity, other specified: Secondary | ICD-10-CM | POA: Insufficient documentation

## 2017-07-12 DIAGNOSIS — S0083XA Contusion of other part of head, initial encounter: Secondary | ICD-10-CM | POA: Diagnosis not present

## 2017-07-12 DIAGNOSIS — Y999 Unspecified external cause status: Secondary | ICD-10-CM | POA: Insufficient documentation

## 2017-07-12 DIAGNOSIS — Y929 Unspecified place or not applicable: Secondary | ICD-10-CM | POA: Insufficient documentation

## 2017-07-12 DIAGNOSIS — Z79899 Other long term (current) drug therapy: Secondary | ICD-10-CM | POA: Insufficient documentation

## 2017-07-12 DIAGNOSIS — I10 Essential (primary) hypertension: Secondary | ICD-10-CM | POA: Insufficient documentation

## 2017-07-12 DIAGNOSIS — S0990XA Unspecified injury of head, initial encounter: Secondary | ICD-10-CM | POA: Diagnosis present

## 2017-07-12 DIAGNOSIS — F17210 Nicotine dependence, cigarettes, uncomplicated: Secondary | ICD-10-CM | POA: Diagnosis not present

## 2017-07-12 MED ORDER — NAPROXEN 500 MG PO TABS: 500.0000 mg | ORAL_TABLET | Freq: Two times a day (BID) | ORAL | 0 refills | Status: DC

## 2017-07-12 MED ORDER — KETOROLAC TROMETHAMINE 30 MG/ML IJ SOLN
30.0000 mg | Freq: Once | INTRAMUSCULAR | Status: AC
  Administered 2017-07-12: 30 mg via INTRAMUSCULAR
  Filled 2017-07-12: qty 1

## 2017-07-12 MED ORDER — HYDROCODONE-ACETAMINOPHEN 5-325 MG PO TABS
1.0000 | ORAL_TABLET | Freq: Once | ORAL | Status: AC
  Administered 2017-07-12: 1 via ORAL
  Filled 2017-07-12: qty 1
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
## 2017-07-12 NOTE — ED Notes (Signed)
Called in prescriptions to [facility] [place] for doxycycline 100mg  bid x 14 days, dispense 28, no refills and nifedipine 30mg  24 hr tab take 30mg  po daily, dispense 30 no refills.
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
## 2017-08-02 DIAGNOSIS — Z5181 Encounter for therapeutic drug level monitoring: Secondary | ICD-10-CM | POA: Diagnosis not present

## 2017-08-04 DIAGNOSIS — Z5181 Encounter for therapeutic drug level monitoring: Secondary | ICD-10-CM | POA: Diagnosis not present

## 2017-08-10 DIAGNOSIS — Z331 Pregnant state, incidental: Secondary | ICD-10-CM | POA: Diagnosis not present

## 2017-08-10 DIAGNOSIS — Z5181 Encounter for therapeutic drug level monitoring: Secondary | ICD-10-CM | POA: Diagnosis not present

## 2017-08-10 DIAGNOSIS — Z0001 Encounter for general adult medical examination with abnormal findings: Secondary | ICD-10-CM | POA: Diagnosis not present

## 2017-08-10 DIAGNOSIS — Z640 Problems related to unwanted pregnancy: Secondary | ICD-10-CM | POA: Diagnosis not present

## 2017-08-12 ENCOUNTER — Emergency Department (HOSPITAL_COMMUNITY)
Admission: EM | Admit: 2017-08-12 | Discharge: 2017-08-12 | Disposition: A | Discharge status: Home / Self Care | Payer: Medicaid Other | Attending: Emergency Medicine | Admitting: Emergency Medicine

## 2017-08-12 ENCOUNTER — Encounter (HOSPITAL_COMMUNITY): Payer: Self-pay | Admitting: Emergency Medicine

## 2017-08-12 ENCOUNTER — Other Ambulatory Visit: Payer: Self-pay

## 2017-08-12 ENCOUNTER — Emergency Department (HOSPITAL_COMMUNITY): Payer: Medicaid Other

## 2017-08-12 DIAGNOSIS — O468X1 Other antepartum hemorrhage, first trimester: Secondary | ICD-10-CM | POA: Insufficient documentation

## 2017-08-12 DIAGNOSIS — Z91040 Latex allergy status: Secondary | ICD-10-CM | POA: Insufficient documentation

## 2017-08-12 DIAGNOSIS — O418X10 Other specified disorders of amniotic fluid and membranes, first trimester, not applicable or unspecified: Secondary | ICD-10-CM | POA: Insufficient documentation

## 2017-08-12 DIAGNOSIS — I10 Essential (primary) hypertension: Secondary | ICD-10-CM | POA: Diagnosis not present

## 2017-08-12 DIAGNOSIS — F17210 Nicotine dependence, cigarettes, uncomplicated: Secondary | ICD-10-CM | POA: Insufficient documentation

## 2017-08-12 DIAGNOSIS — Z3A01 Less than 8 weeks gestation of pregnancy: Secondary | ICD-10-CM | POA: Diagnosis not present

## 2017-08-12 DIAGNOSIS — R109 Unspecified abdominal pain: Secondary | ICD-10-CM | POA: Insufficient documentation

## 2017-08-12 DIAGNOSIS — O9989 Other specified diseases and conditions complicating pregnancy, childbirth and the puerperium: Secondary | ICD-10-CM | POA: Diagnosis present

## 2017-08-12 DIAGNOSIS — O368910 Maternal care for other specified fetal problems, first trimester, not applicable or unspecified: Secondary | ICD-10-CM | POA: Diagnosis not present

## 2017-08-12 DIAGNOSIS — O208 Other hemorrhage in early pregnancy: Secondary | ICD-10-CM | POA: Diagnosis not present

## 2017-08-12 LAB — CBC
HCT: 38.8 % (ref 36.0–46.0)
Hemoglobin: 12.3 g/dL (ref 12.0–15.0)
MCH: 27.6 pg (ref 26.0–34.0)
MCHC: 31.7 g/dL (ref 30.0–36.0)
MCV: 87.2 fL (ref 78.0–100.0)
Platelets: 237 10*3/uL (ref 150–400)
RBC: 4.45 MIL/uL (ref 3.87–5.11)
RDW: 13.9 % (ref 11.5–15.5)
WBC: 11.5 10*3/uL — ABNORMAL HIGH (ref 4.0–10.5)

## 2017-08-12 LAB — COMPREHENSIVE METABOLIC PANEL
ALT: 8 U/L (ref 0–44)
AST: 13 U/L — ABNORMAL LOW (ref 15–41)
Albumin: 3.2 g/dL — ABNORMAL LOW (ref 3.5–5.0)
Alkaline Phosphatase: 42 U/L (ref 38–126)
Anion gap: 7 (ref 5–15)
BUN: 7 mg/dL (ref 6–20)
CO2: 20 mmol/L — ABNORMAL LOW (ref 22–32)
Calcium: 8.9 mg/dL (ref 8.9–10.3)
Chloride: 110 mmol/L (ref 98–111)
Creatinine, Ser: 0.82 mg/dL (ref 0.44–1.00)
GFR calc Af Amer: >60 mL/min (ref >60)
GFR calc non Af Amer: >60 mL/min (ref >60)
Glucose, Bld: 105 mg/dL — ABNORMAL HIGH (ref 70–99)
Potassium: 3.6 mmol/L (ref 3.5–5.1)
Sodium: 137 mmol/L (ref 135–145)
Total Bilirubin: 0.7 mg/dL (ref 0.3–1.2)
Total Protein: 6.1 g/dL — ABNORMAL LOW (ref 6.5–8.1)

## 2017-08-12 LAB — URINALYSIS, ROUTINE W REFLEX MICROSCOPIC
Bilirubin Urine: NEGATIVE
Glucose, UA: NEGATIVE mg/dL
Hgb urine dipstick: NEGATIVE
Ketones, ur: NEGATIVE mg/dL
Leukocytes, UA: NEGATIVE
Nitrite: NEGATIVE
Protein, ur: NEGATIVE mg/dL
Specific Gravity, Urine: 1.024 (ref 1.005–1.030)
pH: 6 (ref 5.0–8.0)

## 2017-08-12 LAB — I-STAT BETA HCG BLOOD, ED (MC, WL, AP ONLY): I-stat hCG, quantitative: >2000 m[IU]/mL — ABNORMAL HIGH (ref <5)

## 2017-08-12 LAB — LIPASE, BLOOD: Lipase: 32 U/L (ref 11–51)

## 2017-08-12 MED ORDER — ACETAMINOPHEN 325 MG PO TABS
650.0000 mg | ORAL_TABLET | Freq: Once | ORAL | Status: AC
  Administered 2017-08-12: 650 mg via ORAL
  Filled 2017-08-12: qty 2

## 2017-08-12 NOTE — Discharge Instructions (Addendum)
[facility] on your pain and to discuss options of abortion.  If you have vaginal bleeding, continued pain with pregnancy, or any other pregnancy-related concerns, you should follow up with the [facility]. Return to the ER if you develop any new or concerning symptoms.

## 2017-08-12 NOTE — ED Triage Notes (Signed)
Patient complains of lower abdominal pain since she got pregnant approximately 6 weeks ago. Denies any other symptoms other than pain. Patient alert, oriented, and in no apparent distress at this time.
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
## 2017-08-13 ENCOUNTER — Encounter: Payer: Self-pay | Admitting: Advanced Practice Midwife

## 2017-08-13 ENCOUNTER — Inpatient Hospital Stay (HOSPITAL_COMMUNITY)
Admission: AD | Admit: 2017-08-13 | Discharge: 2017-08-13 | Disposition: A | Discharge status: Home / Self Care | Payer: Medicaid Other | Source: Ambulatory Visit | Attending: Obstetrics and Gynecology | Admitting: Obstetrics and Gynecology

## 2017-08-13 DIAGNOSIS — F17210 Nicotine dependence, cigarettes, uncomplicated: Secondary | ICD-10-CM | POA: Diagnosis not present

## 2017-08-13 DIAGNOSIS — O26891 Other specified pregnancy related conditions, first trimester: Secondary | ICD-10-CM | POA: Diagnosis not present

## 2017-08-13 DIAGNOSIS — R102 Pelvic and perineal pain: Secondary | ICD-10-CM

## 2017-08-13 DIAGNOSIS — Z3A01 Less than 8 weeks gestation of pregnancy: Secondary | ICD-10-CM

## 2017-08-13 DIAGNOSIS — O99331 Smoking (tobacco) complicating pregnancy, first trimester: Secondary | ICD-10-CM | POA: Diagnosis not present

## 2017-08-13 DIAGNOSIS — R1084 Generalized abdominal pain: Secondary | ICD-10-CM | POA: Diagnosis not present

## 2017-08-13 DIAGNOSIS — R52 Pain, unspecified: Secondary | ICD-10-CM | POA: Diagnosis not present

## 2017-08-13 HISTORY — DX: Gastro-esophageal reflux disease without esophagitis: K21.9
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
## 2017-08-13 NOTE — Discharge Instructions (Signed)
Clinical social worker will contact you tomorrow per your request.
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
## 2017-08-16 DIAGNOSIS — Z5181 Encounter for therapeutic drug level monitoring: Secondary | ICD-10-CM | POA: Diagnosis not present

## 2017-09-15 ENCOUNTER — Emergency Department (HOSPITAL_COMMUNITY)
Admission: EM | Admit: 2017-09-15 | Discharge: 2017-09-15 | Disposition: A | Discharge status: Home / Self Care | Payer: Medicaid Other | Attending: Emergency Medicine | Admitting: Emergency Medicine

## 2017-09-15 ENCOUNTER — Encounter (HOSPITAL_COMMUNITY): Payer: Self-pay | Admitting: Emergency Medicine

## 2017-09-15 ENCOUNTER — Other Ambulatory Visit: Payer: Self-pay

## 2017-09-15 DIAGNOSIS — F17210 Nicotine dependence, cigarettes, uncomplicated: Secondary | ICD-10-CM | POA: Diagnosis not present

## 2017-09-15 DIAGNOSIS — O9989 Other specified diseases and conditions complicating pregnancy, childbirth and the puerperium: Secondary | ICD-10-CM | POA: Insufficient documentation

## 2017-09-15 DIAGNOSIS — Z202 Contact with and (suspected) exposure to infections with a predominantly sexual mode of transmission: Secondary | ICD-10-CM | POA: Diagnosis not present

## 2017-09-15 DIAGNOSIS — O26891 Other specified pregnancy related conditions, first trimester: Secondary | ICD-10-CM | POA: Diagnosis not present

## 2017-09-15 DIAGNOSIS — Z3A00 Weeks of gestation of pregnancy not specified: Secondary | ICD-10-CM | POA: Diagnosis not present

## 2017-09-15 DIAGNOSIS — R102 Pelvic and perineal pain: Secondary | ICD-10-CM

## 2017-09-15 DIAGNOSIS — Z79899 Other long term (current) drug therapy: Secondary | ICD-10-CM | POA: Insufficient documentation

## 2017-09-15 HISTORY — DX: Female pelvic inflammatory disease, unspecified: N73.9

## 2017-09-15 LAB — URINALYSIS, ROUTINE W REFLEX MICROSCOPIC
Bilirubin Urine: NEGATIVE
Glucose, UA: NEGATIVE mg/dL
Hgb urine dipstick: NEGATIVE
Ketones, ur: NEGATIVE mg/dL
Leukocytes, UA: NEGATIVE
Nitrite: NEGATIVE
Protein, ur: 30 mg/dL — AB
Specific Gravity, Urine: 1.027 (ref 1.005–1.030)
pH: 7 (ref 5.0–8.0)

## 2017-09-15 LAB — COMPREHENSIVE METABOLIC PANEL
ALT: 8 U/L (ref 0–44)
AST: 11 U/L — ABNORMAL LOW (ref 15–41)
Albumin: 3.1 g/dL — ABNORMAL LOW (ref 3.5–5.0)
Alkaline Phosphatase: 36 U/L — ABNORMAL LOW (ref 38–126)
Anion gap: 7 (ref 5–15)
BUN: 7 mg/dL (ref 6–20)
CO2: 23 mmol/L (ref 22–32)
Calcium: 8.9 mg/dL (ref 8.9–10.3)
Chloride: 106 mmol/L (ref 98–111)
Creatinine, Ser: 0.75 mg/dL (ref 0.44–1.00)
GFR calc Af Amer: >60 mL/min (ref >60)
GFR calc non Af Amer: >60 mL/min (ref >60)
Glucose, Bld: 93 mg/dL (ref 70–99)
Potassium: 3.7 mmol/L (ref 3.5–5.1)
Sodium: 136 mmol/L (ref 135–145)
Total Bilirubin: 0.7 mg/dL (ref 0.3–1.2)
Total Protein: 6.4 g/dL — ABNORMAL LOW (ref 6.5–8.1)

## 2017-09-15 LAB — CBC
HCT: 38.4 % (ref 36.0–46.0)
Hemoglobin: 12.8 g/dL (ref 12.0–15.0)
MCH: 28.5 pg (ref 26.0–34.0)
MCHC: 33.3 g/dL (ref 30.0–36.0)
MCV: 85.5 fL (ref 78.0–100.0)
Platelets: 238 10*3/uL (ref 150–400)
RBC: 4.49 MIL/uL (ref 3.87–5.11)
RDW: 13.8 % (ref 11.5–15.5)
WBC: 10.3 10*3/uL (ref 4.0–10.5)

## 2017-09-15 LAB — WET PREP, GENITAL
Clue Cells Wet Prep HPF POC: NONE SEEN
Sperm: NONE SEEN
Trich, Wet Prep: NONE SEEN
Yeast Wet Prep HPF POC: NONE SEEN

## 2017-09-15 LAB — PREGNANCY, URINE: Preg Test, Ur: POSITIVE — AB

## 2017-09-15 LAB — LIPASE, BLOOD: Lipase: 28 U/L (ref 11–51)

## 2017-09-15 MED ORDER — LIDOCAINE HCL (PF) 1 % IJ SOLN
INTRAMUSCULAR | Status: AC
  Administered 2017-09-15: 17:00:00
  Filled 2017-09-15: qty 2

## 2017-09-15 MED ORDER — CEFTRIAXONE SODIUM 250 MG IJ SOLR
250.0000 mg | Freq: Once | INTRAMUSCULAR | Status: AC
  Administered 2017-09-15: 250 mg via INTRAMUSCULAR
  Filled 2017-09-15: qty 250

## 2017-09-15 MED ORDER — IBUPROFEN 400 MG PO TABS
600.0000 mg | ORAL_TABLET | Freq: Once | ORAL | Status: AC
  Administered 2017-09-15: 600 mg via ORAL
  Filled 2017-09-15: qty 2

## 2017-09-15 MED ORDER — DOXYCYCLINE HYCLATE 100 MG PO CAPS: 100.0000 mg | ORAL_CAPSULE | Freq: Two times a day (BID) | ORAL | 0 refills | Status: DC

## 2017-09-15 MED ORDER — OXYCODONE-ACETAMINOPHEN 5-325 MG PO TABS
1.0000 | ORAL_TABLET | Freq: Once | ORAL | Status: AC
  Administered 2017-09-15: 1 via ORAL
  Filled 2017-09-15: qty 1

## 2017-09-15 MED ORDER — TRAMADOL HCL 50 MG PO TABS: 50.0000 mg | ORAL_TABLET | Freq: Four times a day (QID) | ORAL | 0 refills | Status: DC | PRN

## 2017-09-15 NOTE — ED Provider Notes (Signed)
[facility] EMERGENCY DEPARTMENT Provider Note   CSN: [id number] Arrival date & time: 09/15/17  1346     History   Chief Complaint Chief Complaint  Patient presents with  . Pelvic Pain    HPI Priscilla Castillo is a 27 y.o. female.  HPI   [age]-year-old female with pelvic pain/pressure.  Onset about 2 to 3 days ago.  Intermittent.  Increasing pressure with urination.  No dysuria.  No discharge.  No fevers or chills.  She is in the first trimester of pregnancy but is planning on obtaining abortion.  Nausea without any vomiting.  No fevers or chills.  Past Medical History:  Diagnosis Date  . GERD (gastroesophageal reflux disease)   . Hypertension   . Pelvic inflammatory disease (PID)     There are no active problems to display for this patient.   Past Surgical History:  Procedure Laterality Date  . CESAREAN SECTION     x 2     OB History    Gravida  6   Para  3   Term  2   Preterm  1   AB  2   Living  3     SAB  2   TAB      Ectopic      Multiple  0   Live Births  3            Home Medications    Prior to Admission medications   Medication Sig Start Date End Date Taking? Authorizing Provider  NIFEdipine (PROCARDIA-XL/ADALAT-CC/NIFEDICAL-XL) 30 MG 24 hr tablet Take 30 mg by mouth daily.   Yes [provider]  doxycycline (VIBRAMYCIN) 100 MG capsule Take 1 capsule (100 mg total) by mouth 2 (two) times daily. Patient not taking: Reported on 08/12/2017 07/08/17   Gilda Crease, MD  HYDROcodone-acetaminophen (NORCO/VICODIN) 5-325 MG tablet Take 1-2 tablets by mouth every 4 (four) hours as needed for moderate pain. Patient not taking: Reported on 08/12/2017 07/08/17   Gilda Crease, MD  metroNIDAZOLE (FLAGYL) 500 MG tablet Take 1 tablet (500 mg total) by mouth 2 (two) times daily. Patient not taking: Reported on 08/12/2017 07/08/17   Raeford Razor, MD  tolnaftate (TINACTIN) 1 % cream Apply 1 application topically 2 (two)  times daily. Patient not taking: Reported on 08/12/2017 06/11/15   Ivery Quale, PA-C    Family History No family history on file.  Social History Social History   Tobacco Use  . Smoking status: Current Every Day Smoker    Packs/day: 0.33    Types: Cigarettes  . Smokeless tobacco: Never Used  Substance Use Topics  . Alcohol use: Not Currently    Comment: occ.   . Drug use: Not Currently    Types: Marijuana     Allergies   Shellfish allergy; Banana; and Latex   Review of Systems Review of Systems  All systems reviewed and negative, other than as noted in HPI.  Physical Exam Updated Vital Signs BP 121/72 (BP Location: Right Arm)   Pulse (!) 107   Temp 98.3 F (36.8 C) (Oral)   Resp 18   Ht 5\' 1"  (1.549 m)   Wt 94 kg   LMP 06/12/2017 (Approximate)   SpO2 100%   BMI 39.17 kg/m   Physical Exam  Constitutional: She appears well-developed and well-nourished. No distress.  HENT:  Head: Normocephalic and atraumatic.  Eyes: Conjunctivae are normal. Right eye exhibits no discharge. Left eye exhibits no discharge.  Neck: Neck  supple.  Cardiovascular: Normal rate, regular rhythm and normal heart sounds. Exam reveals no gallop and no friction rub.  No murmur heard. Pulmonary/Chest: Effort normal and breath sounds normal. No respiratory distress.  Abdominal: Soft. She exhibits no distension. There is no tenderness.  Genitourinary:  Genitourinary Comments: Chaperone present.  Normal external female genitalia.  Moderate whitish/gray discharge.  Cervix normal in appearance.  No CMT or adnexal mass/tenderness appreciated.  Musculoskeletal: She exhibits no edema or tenderness.  Neurological: She is alert.  Skin: Skin is warm and dry.  Psychiatric: She has a normal mood and affect. Her behavior is normal. Thought content normal.  Nursing note and vitals reviewed.    ED Treatments / Results  Labs (all labs ordered are listed, but only abnormal results are  displayed) Labs Reviewed  WET PREP, GENITAL - Abnormal; Notable for the following components:      Result Value   WBC, Wet Prep HPF POC TOO NUMEROUS TO COUNT (*)    All other components within normal limits  COMPREHENSIVE METABOLIC PANEL - Abnormal; Notable for the following components:   Total Protein 6.4 (*)    Albumin 3.1 (*)    AST 11 (*)    Alkaline Phosphatase 36 (*)    All other components within normal limits  URINALYSIS, ROUTINE W REFLEX MICROSCOPIC - Abnormal; Notable for the following components:   Protein, ur 30 (*)    Bacteria, UA RARE (*)    All other components within normal limits  PREGNANCY, URINE - Abnormal; Notable for the following components:   Preg Test, Ur POSITIVE (*)    All other components within normal limits  LIPASE, BLOOD  CBC  GC/CHLAMYDIA PROBE AMP ([place]) NOT AT [facility]    EKG None  Radiology No results found.  Procedures Procedures (including critical care time)  Medications Ordered in ED Medications  oxyCODONE-acetaminophen (PERCOCET/ROXICET) 5-325 MG per tablet 1 tablet (1 tablet Oral Given 09/15/17 1517)  ibuprofen (ADVIL,MOTRIN) tablet 600 mg (600 mg Oral Given 09/15/17 1517)  cefTRIAXone (ROCEPHIN) injection 250 mg (250 mg Intramuscular Given 09/15/17 1644)  lidocaine (PF) (XYLOCAINE) 1 % injection (  Given by Other 09/15/17 1706)     Initial Impression / Assessment and Plan / ED Course  I have reviewed the triage vital signs and the nursing notes.  Pertinent labs & imaging results that were available during my care of the patient were reviewed by me and considered in my medical decision making (see chart for details).     I have reviewed the triage vital signs and the nursing notes. Prior records were reviewed for additional information.    Pertinent labs & imaging results that were available during my care of the patient were reviewed by me and considered in my medical decision making (see chart for details).  27 year old  female with abdominal/pelvic pain and pregnancy.  Abdominal exam is benign.  Moderate discharge on pelvic exam but no adnexal or cervical motion tenderness.  I doubt PID.  She requested compared to treatment for GC.  This was provided.  Return precautions were discussed.  She is not interested in keeping this pregnancy.  She is to follow-up with OB with regards to this. Final Clinical Impressions(s) / ED Diagnoses   Final diagnoses:  Pelvic pain in female    ED Discharge Orders    None       Raeford RazorKohut, Robertlee Rogacki, MD 09/21/17 1502

## 2017-09-15 NOTE — ED Triage Notes (Signed)
Pt c/o worsening pelvic pain with nausea x 2-3 days. Hx of PID. Pt reports "pressure" with urination. Pt also reports toothache that is causing a headache.

## 2017-09-15 NOTE — ED Notes (Signed)
Second request for urine sample. Patient unable to urinate at this time.

## 2017-09-16 LAB — GC/CHLAMYDIA PROBE AMP (~~LOC~~) NOT AT ARMC
Chlamydia: NEGATIVE
Neisseria Gonorrhea: NEGATIVE

## 2017-09-29 ENCOUNTER — Emergency Department (HOSPITAL_COMMUNITY)
Admission: EM | Admit: 2017-09-29 | Discharge: 2017-09-29 | Disposition: A | Discharge status: Home / Self Care | Payer: Medicaid Other | Attending: Emergency Medicine | Admitting: Emergency Medicine

## 2017-09-29 ENCOUNTER — Encounter (HOSPITAL_COMMUNITY): Payer: Self-pay | Admitting: Emergency Medicine

## 2017-09-29 ENCOUNTER — Other Ambulatory Visit: Payer: Self-pay

## 2017-09-29 DIAGNOSIS — F17210 Nicotine dependence, cigarettes, uncomplicated: Secondary | ICD-10-CM | POA: Diagnosis not present

## 2017-09-29 DIAGNOSIS — I10 Essential (primary) hypertension: Secondary | ICD-10-CM | POA: Diagnosis not present

## 2017-09-29 DIAGNOSIS — R51 Headache: Secondary | ICD-10-CM | POA: Diagnosis not present

## 2017-09-29 DIAGNOSIS — Z91040 Latex allergy status: Secondary | ICD-10-CM | POA: Insufficient documentation

## 2017-09-29 DIAGNOSIS — Z79899 Other long term (current) drug therapy: Secondary | ICD-10-CM | POA: Insufficient documentation

## 2017-09-29 NOTE — ED Triage Notes (Signed)
Pt C/O high BP today 168/105 around 1 hr ago. Pt states she has a "slight headache." Pt denies any visual changes.

## 2017-09-29 NOTE — Discharge Instructions (Addendum)
Your blood pressure has been in a stable range since you have been here in the emergency department.  Your vital signs are within normal limits.  Your oxygen level is 97%.  Your examination shows no acute problems at this time.  Please read over the eating plan for prevention of hypertension.  Please discuss these elevations in your blood pressure with your primary physician.  Please see your primary physician or return to the emergency department if any changes in your condition, problems, or concerns.

## 2017-09-29 NOTE — ED Provider Notes (Signed)
[facility] EMERGENCY DEPARTMENT Provider Note   CSN: [id number] Arrival date & time: 09/29/17  2109     History   Chief Complaint Chief Complaint  Patient presents with  . Hypertension    HPI Priscilla Castillo is a 27 y.o. female.  The history is provided by the patient.  Hypertension  The current episode started 3 to 5 hours ago. The problem occurs constantly. The problem has not changed since onset.Associated symptoms include headaches. Pertinent negatives include no chest pain, no abdominal pain and no shortness of breath. Nothing aggravates the symptoms. Nothing relieves the symptoms. She has tried nothing for the symptoms.    Past Medical History:  Diagnosis Date  . GERD (gastroesophageal reflux disease)   . Hypertension   . Pelvic inflammatory disease (PID)     There are no active problems to display for this patient.   Past Surgical History:  Procedure Laterality Date  . CESAREAN SECTION     x 2     OB History    Gravida  6   Para  3   Term  2   Preterm  1   AB  2   Living  3     SAB  2   TAB      Ectopic      Multiple  0   Live Births  3            Home Medications    Prior to Admission medications   Medication Sig Start Date End Date Taking? Authorizing Provider  doxycycline (VIBRAMYCIN) 100 MG capsule Take 1 capsule (100 mg total) by mouth 2 (two) times daily. Patient not taking: Reported on 08/12/2017 07/08/17   Gilda Crease, MD  doxycycline (VIBRAMYCIN) 100 MG capsule Take 1 capsule (100 mg total) by mouth 2 (two) times daily. 09/15/17   Raeford Razor, MD  NIFEdipine (PROCARDIA-XL/ADALAT-CC/NIFEDICAL-XL) 30 MG 24 hr tablet Take 30 mg by mouth daily.    [provider]  tolnaftate (TINACTIN) 1 % cream Apply 1 application topically 2 (two) times daily. Patient not taking: Reported on 08/12/2017 06/11/15   Ivery Quale, PA-C  traMADol (ULTRAM) 50 MG tablet Take 1 tablet (50 mg total) by mouth every 6 (six)  hours as needed. 09/15/17   Raeford Razor, MD    Family History No family history on file.  Social History Social History   Tobacco Use  . Smoking status: Current Every Day Smoker    Packs/day: 0.33    Types: Cigarettes  . Smokeless tobacco: Never Used  Substance Use Topics  . Alcohol use: Not Currently    Comment: occ.   . Drug use: Not Currently    Types: Marijuana     Allergies   Shellfish allergy; Banana; and Latex   Review of Systems Review of Systems  Constitutional: Negative for activity change.       All ROS Neg except as noted in HPI  HENT: Negative for nosebleeds.   Eyes: Negative for photophobia and discharge.  Respiratory: Negative for cough, shortness of breath and wheezing.   Cardiovascular: Negative for chest pain and palpitations.  Gastrointestinal: Negative for abdominal pain and blood in stool.  Genitourinary: Negative for dysuria, frequency and hematuria.  Musculoskeletal: Negative for arthralgias, back pain and neck pain.       Swelling of extremities  Skin: Negative.   Neurological: Positive for headaches. Negative for dizziness, seizures and speech difficulty.  Psychiatric/Behavioral: Negative for confusion and hallucinations.  Physical Exam Updated Vital Signs BP (!) 155/87 (BP Location: Right Arm)   Pulse 81   Temp 98.2 F (36.8 C) (Oral)   Resp 15   LMP 06/12/2017 (Approximate)   SpO2 97%   Breastfeeding? Unknown   Physical Exam  Constitutional: She appears well-developed and well-nourished. No distress.  HENT:  Head: Normocephalic and atraumatic.  Right Ear: External ear normal.  Left Ear: External ear normal.  Eyes: Conjunctivae are normal. Right eye exhibits no discharge. Left eye exhibits no discharge. No scleral icterus.  Neck: Neck supple. No tracheal deviation present.  Cardiovascular: Normal rate.  Pulmonary/Chest: Effort normal. No stridor. No respiratory distress.  Abdominal: She exhibits no distension.    Musculoskeletal: She exhibits no edema.  Neurological: She is alert. No cranial nerve deficit (no gross deficits) or sensory deficit. She exhibits normal muscle tone. Coordination normal.  Skin: Skin is warm and dry. No rash noted.  Psychiatric: She has a normal mood and affect.  Nursing note and vitals reviewed.    ED Treatments / Results  Labs (all labs ordered are listed, but only abnormal results are displayed) Labs Reviewed - No data to display  EKG None  Radiology No results found.  Procedures Procedures (including critical care time)  Medications Ordered in ED Medications - No data to display   Initial Impression / Assessment and Plan / ED Course  I have reviewed the triage vital signs and the nursing notes.  Pertinent labs & imaging results that were available during my care of the patient were reviewed by me and considered in my medical decision making (see chart for details).       Final Clinical Impressions(s) / ED Diagnoses MDM  Vital signs are within normal limits.  Blood pressure has been monitored since the patient has been in the emergency department.  No significantly high blood pressure appreciated.  No gross neurologic deficits appreciated on examination.  I have asked the patient to use a DASH diet.  I will also asked them to see the primary physician to discuss these blood pressure spikes.  Patient is in agreement with this plan.   Final diagnoses:  Essential hypertension    ED Discharge Orders    None       Ivery Quale, Cordelia Poche 09/29/17 2303    Eber Hong, MD 09/30/17 [phone]

## 2018-02-15 ENCOUNTER — Emergency Department (HOSPITAL_COMMUNITY)
Admission: EM | Admit: 2018-02-15 | Discharge: 2018-02-15 | Disposition: A | Discharge status: Home / Self Care | Payer: Medicaid Other | Attending: Emergency Medicine | Admitting: Emergency Medicine

## 2018-02-15 ENCOUNTER — Other Ambulatory Visit: Payer: Self-pay

## 2018-02-15 ENCOUNTER — Emergency Department (HOSPITAL_COMMUNITY): Payer: Medicaid Other

## 2018-02-15 ENCOUNTER — Encounter (HOSPITAL_COMMUNITY): Payer: Self-pay | Admitting: Emergency Medicine

## 2018-02-15 DIAGNOSIS — R1030 Lower abdominal pain, unspecified: Secondary | ICD-10-CM

## 2018-02-15 DIAGNOSIS — I10 Essential (primary) hypertension: Secondary | ICD-10-CM | POA: Insufficient documentation

## 2018-02-15 DIAGNOSIS — F17210 Nicotine dependence, cigarettes, uncomplicated: Secondary | ICD-10-CM | POA: Diagnosis not present

## 2018-02-15 DIAGNOSIS — Z91040 Latex allergy status: Secondary | ICD-10-CM | POA: Insufficient documentation

## 2018-02-15 DIAGNOSIS — R1110 Vomiting, unspecified: Secondary | ICD-10-CM | POA: Diagnosis not present

## 2018-02-15 DIAGNOSIS — R1031 Right lower quadrant pain: Secondary | ICD-10-CM | POA: Insufficient documentation

## 2018-02-15 DIAGNOSIS — R102 Pelvic and perineal pain: Secondary | ICD-10-CM | POA: Insufficient documentation

## 2018-02-15 DIAGNOSIS — R1032 Left lower quadrant pain: Secondary | ICD-10-CM | POA: Diagnosis not present

## 2018-02-15 LAB — URINALYSIS, ROUTINE W REFLEX MICROSCOPIC
Bilirubin Urine: NEGATIVE
Glucose, UA: NEGATIVE mg/dL
Hgb urine dipstick: NEGATIVE
Ketones, ur: NEGATIVE mg/dL
Nitrite: NEGATIVE
Protein, ur: NEGATIVE mg/dL
Specific Gravity, Urine: 1.028 (ref 1.005–1.030)
pH: 5 (ref 5.0–8.0)

## 2018-02-15 LAB — COMPREHENSIVE METABOLIC PANEL
ALT: 13 U/L (ref 0–44)
AST: 18 U/L (ref 15–41)
Albumin: 3.9 g/dL (ref 3.5–5.0)
Alkaline Phosphatase: 45 U/L (ref 38–126)
Anion gap: 11 (ref 5–15)
BUN: 14 mg/dL (ref 6–20)
CO2: 21 mmol/L — ABNORMAL LOW (ref 22–32)
Calcium: 9.3 mg/dL (ref 8.9–10.3)
Chloride: 106 mmol/L (ref 98–111)
Creatinine, Ser: 0.93 mg/dL (ref 0.44–1.00)
GFR calc Af Amer: >60 mL/min (ref >60)
GFR calc non Af Amer: >60 mL/min (ref >60)
Glucose, Bld: 82 mg/dL (ref 70–99)
Potassium: 4 mmol/L (ref 3.5–5.1)
Sodium: 138 mmol/L (ref 135–145)
Total Bilirubin: 0.7 mg/dL (ref 0.3–1.2)
Total Protein: 7.3 g/dL (ref 6.5–8.1)

## 2018-02-15 LAB — CBC WITH DIFFERENTIAL/PLATELET
Abs Immature Granulocytes: 0.06 10*3/uL (ref 0.00–0.07)
Basophils Absolute: 0 10*3/uL (ref 0.0–0.1)
Basophils Relative: 0 %
Eosinophils Absolute: 0.2 10*3/uL (ref 0.0–0.5)
Eosinophils Relative: 1 %
HCT: 46 % (ref 36.0–46.0)
Hemoglobin: 14.4 g/dL (ref 12.0–15.0)
Immature Granulocytes: 0 %
Lymphocytes Relative: 13 %
Lymphs Abs: 2 10*3/uL (ref 0.7–4.0)
MCH: 28.4 pg (ref 26.0–34.0)
MCHC: 31.3 g/dL (ref 30.0–36.0)
MCV: 90.7 fL (ref 80.0–100.0)
Monocytes Absolute: 0.9 10*3/uL (ref 0.1–1.0)
Monocytes Relative: 6 %
Neutro Abs: 11.5 10*3/uL — ABNORMAL HIGH (ref 1.7–7.7)
Neutrophils Relative %: 80 %
Platelets: 243 10*3/uL (ref 150–400)
RBC: 5.07 MIL/uL (ref 3.87–5.11)
RDW: 13.7 % (ref 11.5–15.5)
WBC: 14.6 10*3/uL — ABNORMAL HIGH (ref 4.0–10.5)
nRBC: 0 % (ref 0.0–0.2)

## 2018-02-15 LAB — HCG, QUANTITATIVE, PREGNANCY: hCG, Beta Chain, Quant, S: 1 m[IU]/mL (ref <5)

## 2018-02-15 LAB — LIPASE, BLOOD: Lipase: 25 U/L (ref 11–51)

## 2018-02-15 MED ORDER — IOHEXOL 300 MG/ML  SOLN
100.0000 mL | Freq: Once | INTRAMUSCULAR | Status: AC | PRN
  Administered 2018-02-15: 100 mL via INTRAVENOUS

## 2018-02-15 MED ORDER — MORPHINE SULFATE (PF) 4 MG/ML IV SOLN
4.0000 mg | Freq: Once | INTRAVENOUS | Status: AC
  Administered 2018-02-15: 4 mg via INTRAVENOUS
  Filled 2018-02-15: qty 1

## 2018-02-15 MED ORDER — OXYCODONE-ACETAMINOPHEN 5-325 MG PO TABS
1.0000 | ORAL_TABLET | Freq: Once | ORAL | Status: AC
  Administered 2018-02-15: 1 via ORAL
  Filled 2018-02-15: qty 1

## 2018-02-15 MED ORDER — ONDANSETRON 4 MG PO TBDP
4.0000 mg | ORAL_TABLET | Freq: Once | ORAL | Status: AC
  Administered 2018-02-15: 4 mg via ORAL
  Filled 2018-02-15: qty 1

## 2018-02-15 NOTE — ED Notes (Signed)
Patient request pain medication. EDP aware

## 2018-02-15 NOTE — Discharge Instructions (Signed)
Follow-up with your primary care, gynecologist for your endometriosis as needed.  Your laboratory results along with your CAT scan today were within normal limits or otherwise explained.  You may continue to take any anti-inflammatory such as Aleve to help with endometriosis pain.

## 2018-02-15 NOTE — ED Provider Notes (Signed)
[facility] EMERGENCY DEPARTMENT Provider Note   CSN: [id number] Arrival date & time: 02/15/18  1541     History   Chief Complaint Chief Complaint  Patient presents with  . Abdominal Pain    HPI Priscilla Castillo is a 28 y.o. female.  28 y.o female with a PHM of HTN, PID, GERD presents to the ED with a chief complaint of pelvic pain and pressure times yesterday.  Patient reports this is a chronic complaint as this "happens so often, this returns every 3 months ".  Orts her pain is mainly located on the lower abdominal region and feels crampy in nature along the middle with radiation to the sides.  Dates this pain is worse with sitting, standing, walking.  She also endorses some nausea.  Reports being seen previously for the same complaint and was told she had endometriosis.  Patient reports she recently moved to the area and has not been able to establish a gynecologist follow-up.  According to patient's chart which I have personally review patient has been seen in the ED for pelvic pain 3 times in the past 6 months.  She has not tried any medication for relieving her symptoms as she states nothing works.  Denies any fever, vomiting, vaginal discharge or vaginal bleeding.     Past Medical History:  Diagnosis Date  . GERD (gastroesophageal reflux disease)   . Hypertension   . Pelvic inflammatory disease (PID)     There are no active problems to display for this patient.   Past Surgical History:  Procedure Laterality Date  . CESAREAN SECTION     x 2     OB History    Gravida  6   Para  3   Term  2   Preterm  1   AB  2   Living  3     SAB  2   TAB      Ectopic      Multiple  0   Live Births  3            Home Medications    Prior to Admission medications   Medication Sig Start Date End Date Taking? Authorizing Provider  doxycycline (VIBRAMYCIN) 100 MG capsule Take 1 capsule (100 mg total) by mouth 2 (two) times daily. Patient  not taking: Reported on 08/12/2017 07/08/17   Gilda Crease, MD  doxycycline (VIBRAMYCIN) 100 MG capsule Take 1 capsule (100 mg total) by mouth 2 (two) times daily. Patient not taking: Reported on 02/15/2018 09/15/17   Raeford Razor, MD  tolnaftate (TINACTIN) 1 % cream Apply 1 application topically 2 (two) times daily. Patient not taking: Reported on 08/12/2017 06/11/15   Ivery Quale, PA-C  traMADol (ULTRAM) 50 MG tablet Take 1 tablet (50 mg total) by mouth every 6 (six) hours as needed. Patient not taking: Reported on 02/15/2018 09/15/17   Raeford Razor, MD    Family History History reviewed. No pertinent family history.  Social History Social History   Tobacco Use  . Smoking status: Current Every Day Smoker    Packs/day: 0.33    Types: Cigarettes  . Smokeless tobacco: Never Used  Substance Use Topics  . Alcohol use: Yes    Alcohol/week: 2.0 standard drinks    Types: 2 Glasses of wine per week    Comment: occ.   . Drug use: Not Currently    Types: Marijuana     Allergies   Shellfish allergy; Banana; and Latex  Review of Systems Review of Systems  Constitutional: Negative for chills and fever.  HENT: Negative for ear pain and sore throat.   Eyes: Negative for pain and visual disturbance.  Respiratory: Negative for cough and shortness of breath.   Cardiovascular: Negative for chest pain and palpitations.  Gastrointestinal: Positive for abdominal pain and nausea. Negative for vomiting.  Genitourinary: Negative for difficulty urinating, dysuria, flank pain and hematuria.  Musculoskeletal: Negative for arthralgias and back pain.  Skin: Negative for color change and rash.  Neurological: Negative for seizures and syncope.  All other systems reviewed and are negative.    Physical Exam Updated Vital Signs BP 121/74 (BP Location: Right Arm)   Pulse 96   Temp 98.8 F (37.1 C) (Oral)   Resp 16   Ht 5\' 1"  (1.549 m)   Wt 97.5 kg   LMP 02/05/2018   SpO2 100%    BMI 40.62 kg/m   Physical Exam Vitals signs and nursing note reviewed.  Constitutional:      General: She is not in acute distress.    Appearance: She is well-developed.  HENT:     Head: Normocephalic and atraumatic.     Mouth/Throat:     Pharynx: No oropharyngeal exudate.  Eyes:     Pupils: Pupils are equal, round, and reactive to light.  Neck:     Musculoskeletal: Normal range of motion.  Cardiovascular:     Rate and Rhythm: Regular rhythm.     Heart sounds: Normal heart sounds.  Pulmonary:     Effort: Pulmonary effort is normal. No respiratory distress.     Breath sounds: Normal breath sounds.  Abdominal:     General: Bowel sounds are normal. There is no distension.     Palpations: Abdomen is soft.     Tenderness: There is abdominal tenderness in the right lower quadrant, suprapubic area and left lower quadrant. There is no right CVA tenderness or left CVA tenderness. Negative signs include Murphy's sign, Rovsing's sign and McBurney's sign.  Musculoskeletal:        General: No tenderness or deformity.     Right lower leg: No edema.     Left lower leg: No edema.  Skin:    General: Skin is warm and dry.  Neurological:     Mental Status: She is alert and oriented to person, place, and time.      ED Treatments / Results  Labs (all labs ordered are listed, but only abnormal results are displayed) Labs Reviewed  CBC WITH DIFFERENTIAL/PLATELET - Abnormal; Notable for the following components:      Result Value   WBC 14.6 (*)    Neutro Abs 11.5 (*)    All other components within normal limits  COMPREHENSIVE METABOLIC PANEL - Abnormal; Notable for the following components:   CO2 21 (*)    All other components within normal limits  URINALYSIS, ROUTINE W REFLEX MICROSCOPIC - Abnormal; Notable for the following components:   Leukocytes, UA TRACE (*)    Bacteria, UA RARE (*)    All other components within normal limits  LIPASE, BLOOD  HCG, QUANTITATIVE, PREGNANCY     EKG None  Radiology Ct Abdomen Pelvis W Contrast  Result Date: 02/15/2018 CLINICAL DATA:  Increasing abdominal pain since yesterday. Nausea without vomiting. EXAM: CT ABDOMEN AND PELVIS WITH CONTRAST TECHNIQUE: Multidetector CT imaging of the abdomen and pelvis was performed using the standard protocol following bolus administration of intravenous contrast. CONTRAST:  OMNIPAQUE IOHEXOL 300 MG/ML  SOLN  COMPARISON:  None. FINDINGS: Lower chest: No acute abnormality. Hepatobiliary: Tiny layering gallstones within the gallbladder without secondary signs of acute cholecystitis. No gallbladder distention or pericholecystic fluid is noted. The liver is is homogeneous in appearance without space-occupying mass. No biliary dilatation is identified. Pancreas: Normal Spleen: Normal Adrenals/Urinary Tract: Normal Stomach/Bowel: The stomach is decompressed in appearance. No small bowel obstruction or inflammation. Normal appendix. Average stool retention within the colon without inflammation. Vascular/Lymphatic: No significant vascular findings are present. No enlarged abdominal or pelvic lymph nodes. Reproductive: Uterus and bilateral adnexa are unremarkable. Other: Small periumbilical fat containing hernia. No free air nor free fluid. Musculoskeletal: No acute or significant osseous findings. IMPRESSION: 1. Uncomplicated cholelithiasis. 2. Small periumbilical fat containing hernia. 3. No acute bowel pathology. Electronically Signed   By: Tollie Ethavid  Kwon M.D.   On: 02/15/2018 19:58    Procedures Procedures (including critical care time)  Medications Ordered in ED Medications  ondansetron (ZOFRAN-ODT) disintegrating tablet 4 mg (4 mg Oral Given 02/15/18 1631)  oxyCODONE-acetaminophen (PERCOCET/ROXICET) 5-325 MG per tablet 1 tablet (1 tablet Oral Given 02/15/18 1703)  morphine 4 MG/ML injection 4 mg (4 mg Intravenous Given 02/15/18 1943)  iohexol (OMNIPAQUE) 300 MG/ML solution 100 mL (100 mLs Intravenous  Contrast Given 02/15/18 1932)     Initial Impression / Assessment and Plan / ED Course  I have reviewed the triage vital signs and the nursing notes.  Pertinent labs & imaging results that were available during my care of the patient were reviewed by me and considered in my medical decision making (see chart for details).   With a chronic history of endometriosis and lower abdominal pain which happens every 3 months presents to the ED with a flare episode.  CMP showed no electrolyte abnormality, creatinine is within normal limits, LFTs are within normal limits low suspicion for any gallbladder pathology as patient reports most of her pain is in the lower region.  CBC showed a slight leukocytosis of 14.6.  Urine showed a trace of leukocytes and some rare bacteria.  hCG was negative.  Denies any vaginal bleeding, vaginal discharge or urinary symptoms.  Due to patient's pain location and pain with pressure of the left side radiating to the right along with white blood cell count cannot rule out appendicitis.  Will obtain CT abdomen and pelvic to rule out any acute abnormality.  She denies any diarrhea, fevers low suspicion for diverticulitis.  CT abdomen and pelvis showed: 1. Uncomplicated cholelithiasis.  2. Small periumbilical fat containing hernia.  3. No acute bowel pathology.    At this time will discharge patient to follow-up with gynecologist or primary care physician as endometriosis is a recurrent flareup for him.  She reports recently relocating to this area, according to chart patient has been seen here multiple times for gynecological complaints along with a recent abortion months ago.  Precautions provided at length.    Final Clinical Impressions(s) / ED Diagnoses   Final diagnoses:  Lower abdominal pain    ED Discharge Orders    None       Freddy JakschSoto, Nyleah Mcginnis, PA-C 02/15/18 2031    Pricilla LovelessGoldston, Scott, MD 02/17/18 [phone]

## 2018-02-15 NOTE — ED Triage Notes (Signed)
Patient arrived via EMS. Complaints if increase in abdominal pain since yesterday. Reports nausea without vomiting. Pain reported with sitting down and walking. Last menstrual one week ago, currently having light discharge. Last bowel movement today. History of abdominal pain.

## 2018-02-15 NOTE — ED Notes (Signed)
Patient transported to CT 

## 2018-05-23 ENCOUNTER — Telehealth: Payer: Self-pay | Admitting: Orthopaedic Surgery

## 2018-05-23 NOTE — Telephone Encounter (Signed)
Patient called to inquire about appointment for "just falling onto knees". Discussed current situation related to covid-19 restrictions regarding in-person appointment scheduling. Discussed patient's insurance as well which may need primary care provider referral. Will call primary care first, in event they can see her more quickly, and will call back if advised to see orthopaedic specialist.

## 2018-07-31 ENCOUNTER — Encounter (HOSPITAL_COMMUNITY): Payer: Self-pay | Admitting: Emergency Medicine

## 2018-07-31 ENCOUNTER — Other Ambulatory Visit: Payer: Self-pay

## 2018-07-31 ENCOUNTER — Emergency Department (HOSPITAL_COMMUNITY)
Admission: EM | Admit: 2018-07-31 | Discharge: 2018-07-31 | Discharge status: Against Medical Advice | Payer: Medicaid Other | Attending: Emergency Medicine | Admitting: Emergency Medicine

## 2018-07-31 DIAGNOSIS — Z5321 Procedure and treatment not carried out due to patient leaving prior to being seen by health care provider: Secondary | ICD-10-CM | POA: Diagnosis not present

## 2018-07-31 DIAGNOSIS — N83292 Other ovarian cyst, left side: Secondary | ICD-10-CM | POA: Diagnosis not present

## 2018-07-31 DIAGNOSIS — R109 Unspecified abdominal pain: Secondary | ICD-10-CM | POA: Diagnosis present

## 2018-07-31 DIAGNOSIS — N83291 Other ovarian cyst, right side: Secondary | ICD-10-CM | POA: Diagnosis not present

## 2018-07-31 DIAGNOSIS — R52 Pain, unspecified: Secondary | ICD-10-CM | POA: Diagnosis not present

## 2018-07-31 DIAGNOSIS — R102 Pelvic and perineal pain: Secondary | ICD-10-CM | POA: Diagnosis not present

## 2018-07-31 DIAGNOSIS — Z209 Contact with and (suspected) exposure to unspecified communicable disease: Secondary | ICD-10-CM | POA: Diagnosis not present

## 2018-07-31 DIAGNOSIS — R1084 Generalized abdominal pain: Secondary | ICD-10-CM | POA: Diagnosis not present

## 2018-07-31 DIAGNOSIS — N83201 Unspecified ovarian cyst, right side: Secondary | ICD-10-CM | POA: Diagnosis not present

## 2018-07-31 MED ORDER — SODIUM CHLORIDE 0.9% FLUSH: 3.0000 mL | Freq: Once | INTRAVENOUS | Status: DC

## 2018-07-31 NOTE — ED Notes (Signed)
Called no answer

## 2018-07-31 NOTE — ED Triage Notes (Signed)
Patient complains of abdominal pain that started last night. She states when the pain gets bad she feels like she can't walk. Denies n/v/d.

## 2018-08-01 DIAGNOSIS — N83292 Other ovarian cyst, left side: Secondary | ICD-10-CM | POA: Diagnosis not present

## 2018-08-01 DIAGNOSIS — N83291 Other ovarian cyst, right side: Secondary | ICD-10-CM | POA: Diagnosis not present

## 2018-08-22 IMAGING — US US PELV - US TRANSVAGINAL
1 series · 13 of 25 positions shown · non-contrast
Comparison: 04/22/2013

CLINICAL DATA: Pelvic pain intermittently for 1 week, post STD
treatment, prior Caesarean section

EXAM:
TRANSABDOMINAL AND TRANSVAGINAL ULTRASOUND OF PELVIS
DOPPLER ULTRASOUND OF OVARIES
TECHNIQUE: Both transabdominal and transvaginal ultrasound examinations of the
pelvis were performed. Transabdominal technique was performed for
global imaging of the pelvis including uterus, ovaries, adnexal
regions, and pelvic cul-de-sac.
It was necessary to proceed with endovaginal exam following the
transabdominal exam to visualize the endometrium and ovaries. Color
and duplex Doppler ultrasound was utilized to evaluate blood flow to
the ovaries.

[Series 1: us pelv - us transvaginal · 0.24mm/px · 13 of 67 slices shown]
[im 1/67]
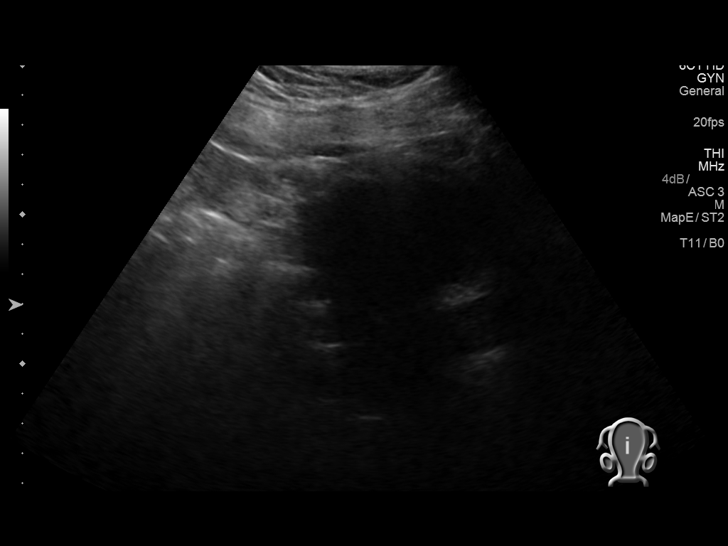
[im 6/67]
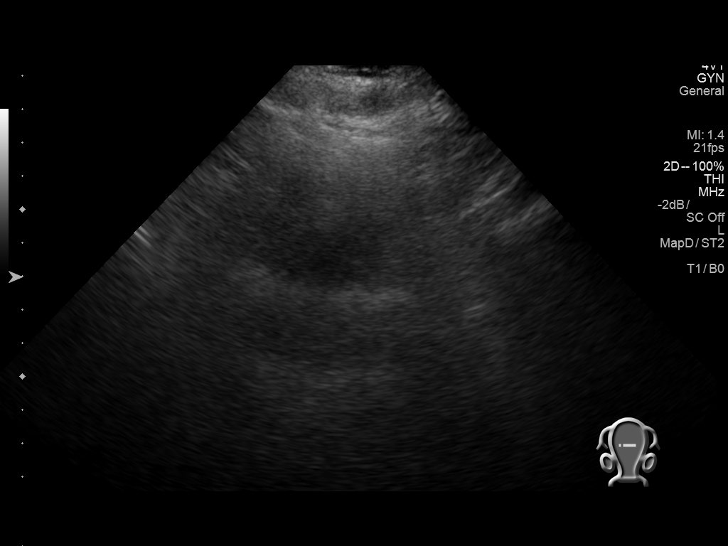
[im 12/67]
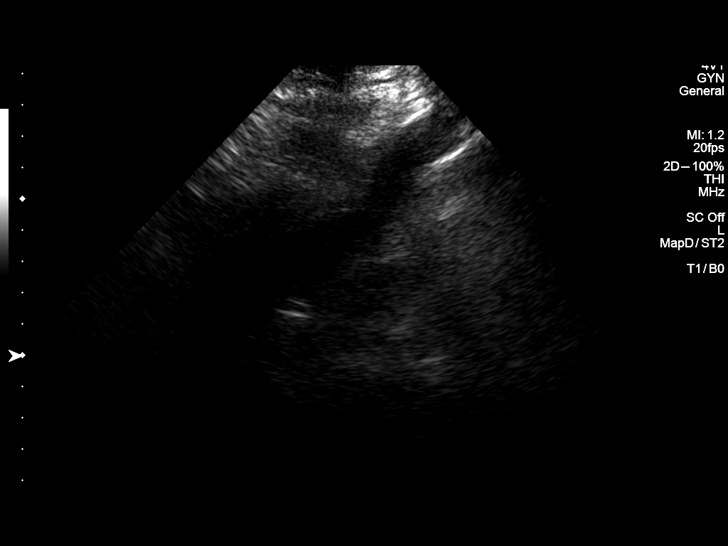
[im 17/67]
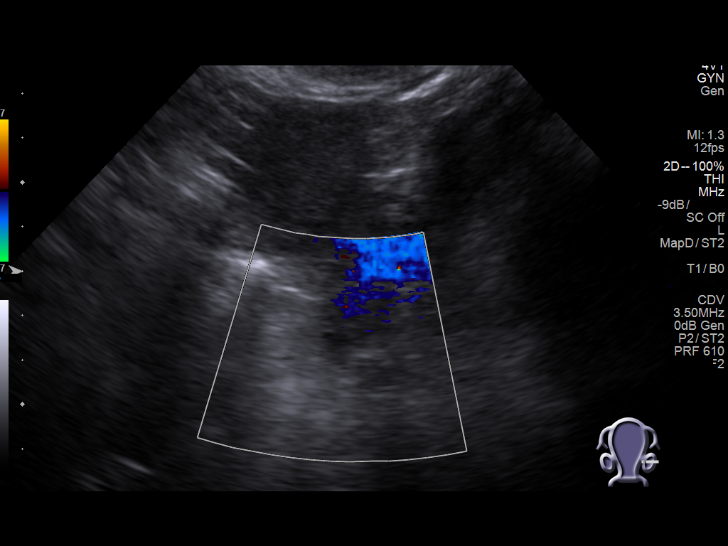
[im 23/67]
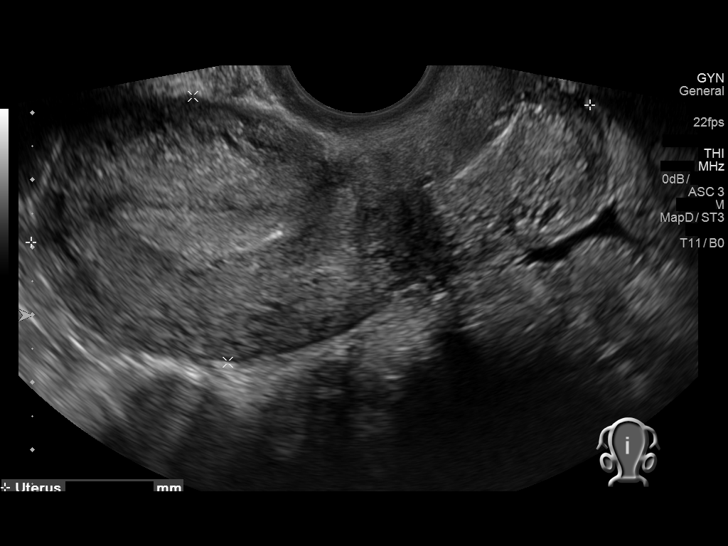
[im 28/67]
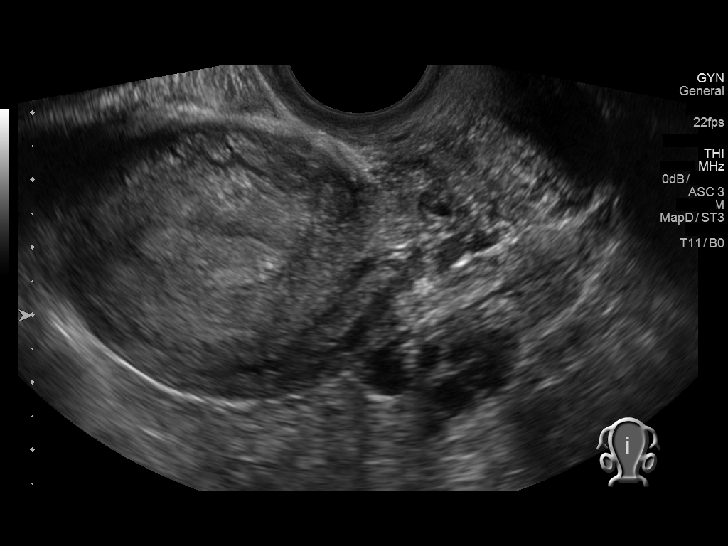
[im 34/67]
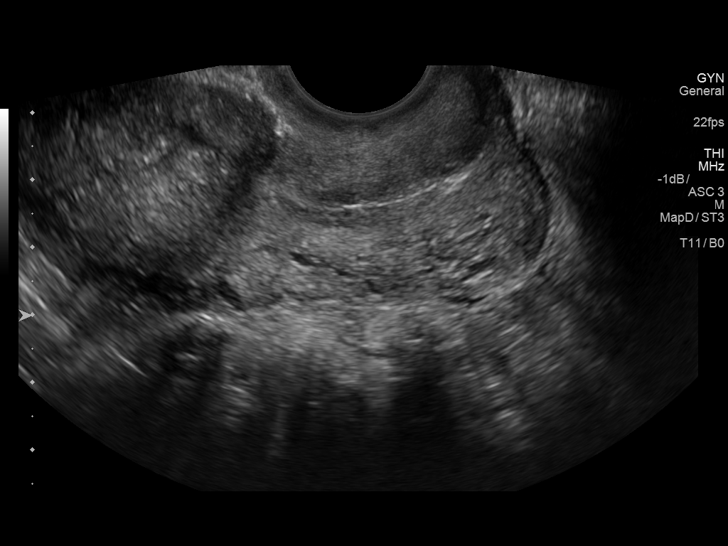
[im 39/67]
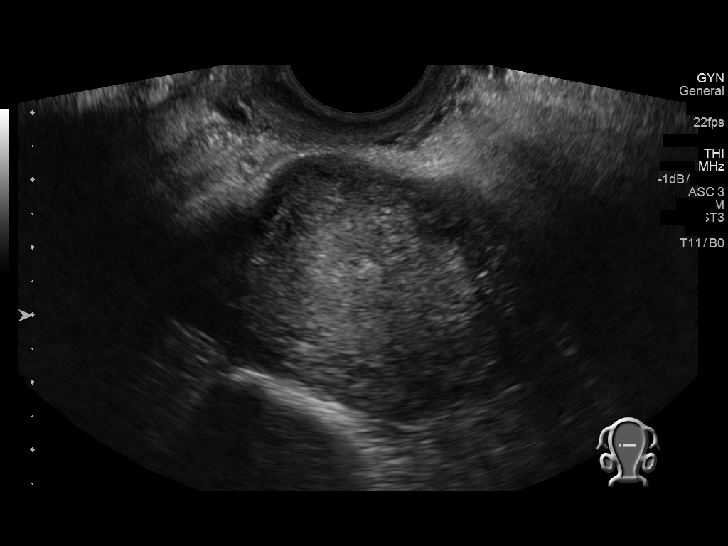
[im 45/67]
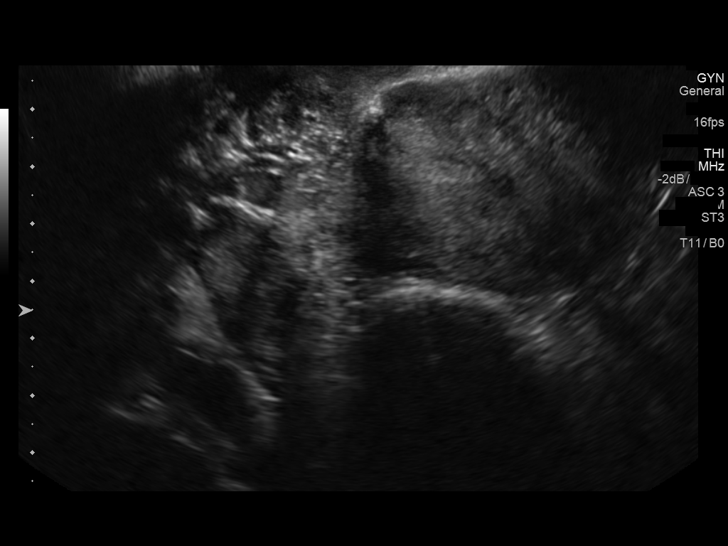
[im 50/67]
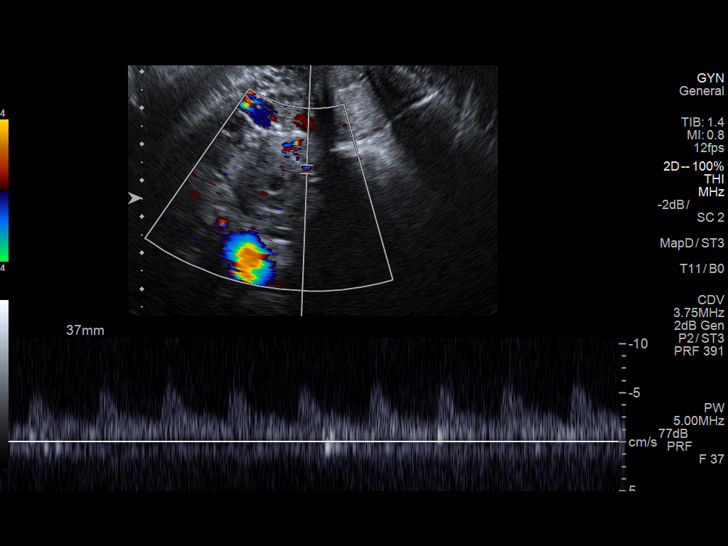
[im 56/67]
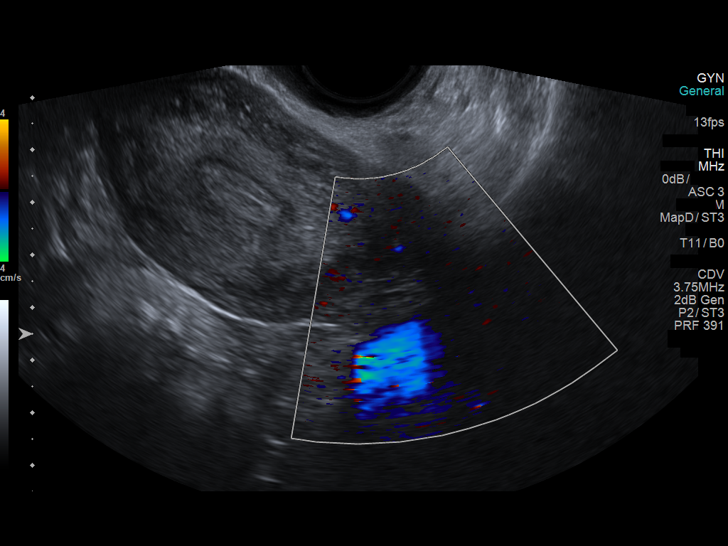
[im 61/67]
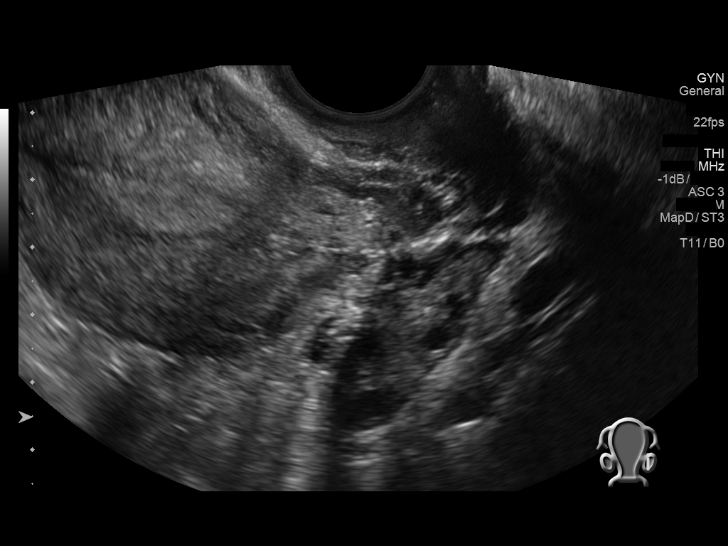
[im 67/67]
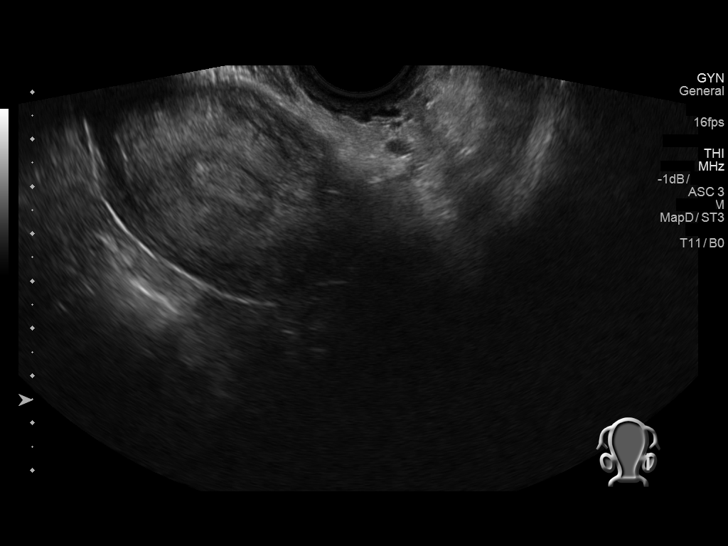

[13 of 25 positions shown; findings below may reference images not displayed]

FINDINGS: Uterus

Measurements: 8.5 x 4.0 x 5.8 cm.  Normal morphology without mass

Endometrium

Thickness: 10 mm thick, normal. No endometrial fluid or focal
abnormality

Right ovary

Measurements: 4.1 x 1.9 x 1.8 cm. Normal morphology without mass.
Internal blood flow present on color Doppler imaging.

Left ovary

Measurements: 3.8 x 1.5 x 1.8 cm. Normal morphology without mass.
Internal blood flow present on color Doppler imaging.

Pulsed Doppler evaluation of both ovaries demonstrates normal
low-resistance arterial and venous waveforms.

Other findings

Trace free pelvic fluid in cul-de-sac.  No adnexal masses.
IMPRESSION: Normal exam.

## 2018-10-10 ENCOUNTER — Ambulatory Visit: Payer: Medicaid Other | Admitting: Women's Health

## 2018-10-10 ENCOUNTER — Encounter: Payer: Self-pay | Admitting: Women's Health

## 2018-10-10 ENCOUNTER — Other Ambulatory Visit: Payer: Self-pay

## 2018-10-10 VITALS — BP 120/76 | HR 96 | Ht 61.0 in | Wt 219.0 lb

## 2018-10-10 DIAGNOSIS — O209 Hemorrhage in early pregnancy, unspecified: Secondary | ICD-10-CM

## 2018-10-10 DIAGNOSIS — Z3201 Encounter for pregnancy test, result positive: Secondary | ICD-10-CM | POA: Diagnosis not present

## 2018-10-10 DIAGNOSIS — Z202 Contact with and (suspected) exposure to infections with a predominantly sexual mode of transmission: Secondary | ICD-10-CM | POA: Diagnosis not present

## 2018-10-10 LAB — POCT URINE PREGNANCY: Preg Test, Ur: POSITIVE — AB

## 2018-10-10 NOTE — Patient Instructions (Signed)
FACTS YOU SHOULD KNOW  °About Early Pregnancy Loss ° °WHAT IS AN EARLY PREGNANCY LOSS? °Once the egg is fertilized with the sperm and begins to develop, it attaches to the lining of the uterus. This early pregnancy tissue may not develop into an embryo (the beginning stage of a baby). Sometimes an embryo does develop but does not continue to grow. These problems can be seen on ultrasound.  °Many women experience the loss of a pregnancy during their lifetime.  As many as 10-30% of pregnancies end in pregnancy loss within the first trimester (or first 12-14 weeks). ° °MANAGEMNT OF EARLY PREGNANCY LOSS: °There are 3 ways to care for an early pregnancy loss:   °(1) Surgery, (2) Medicine, (3) Waiting for you to pass the pregnancy on your own. °The decision as to how to proceed after being diagnosed with and early pregnancy loss is an individual one.  The decision can be made only after appropriate counseling.  You need to weigh the pros and cons of the 3 choices. Then you can make the choice that works for you. ° °SURGERY (D&E) °• Procedure over in 1 day °• Requires being put to sleep °• Bleeding may be light °• Possible problems during surgery, including injury to womb(uterus) °• Care provider has more control °Medicine (CYTOTEC) °• The complete procedure may take days to weeks °• No Surgery °• Bleeding may be heavy at times °• There may be drug side effects °• Patient has more control °Waiting °• You may choose to wait, in which case your own body may complete the passing of the abnormal early pregnancy on its own in about 2-4 weeks °• Your bleeding may be heavy at times °• There is a small possibility that you may need surgery if the bleeding is too much or not all of the pregnancy has passed. ° °CYTOTEC MANAGEMENT °Prostaglandins (cytotec) are the most widely used drug for this purpose. They cause the uterus to cramp and contract. You will place the medicine yourself inside your vagina in the privacy of your home.  Empting of the uterus should occur within 3 days but the process may continue for several weeks. The bleeding may seem heavy at times. ° °INSTRUCTIONS: Take all 4 tablets of cytotec (800mcg total) at one time. This will cause a lot of cramping, you may have bleeding, and pass tissue, then the cramping and bleeding should get better. If you do not pass the tissue, then you can take 4 more tablets of cytotec (800mcg total) 48 hours after your first dose.  You will come back to have your blood drawn to make sure the pregnancy hormones are dropping in 1 week. Please call us if you have any questions.  ° °POSSIBLE SIDE EFFECTS FROM CYTOTEC °• Nausea  Vomiting °• Diarrhea Fever °• Chills  Hot Flashes °Side effects  from the process of the early pregnancy loss include: °• Cramping  Bleeding °• Headaches  Dizziness °RISKS: °This is a low risk procedure. Less than 1 in 100 women has a complication. An incomplete passage of the early pregnancy may occur. Also, hemorrhage (heavy bleeding) could happen.  Rarely the pregnancy will not be passed completely. Excessively heavy bleeding may occur.  Your doctor may need to perform surgery to empty the uterus (D&E). °Afterwards: °Everybody will feel differently after the early pregnancy loss completion. You may have soreness or cramps for a day or two. You may have soreness or cramps for day or two.  You may have   light bleeding for up to 2 weeks. You may be as active as you feel like being. °If you have any of the following problems you may call [facility] at [phone] or [facility]missions [facility] at [phone] if it is after hours. °• If you have pain that does not get better with pain medication °• Bleeding that soaks through 2 thick full-sized sanitary pads in an hour °• Cramps that last longer than 2 days °• Foul smelling discharge °• Fever above 100.4 degrees F °Even if you do not have any of these symptoms, you should have a follow-up exam to make sure you are healing  properly. Your next normal period will usually start again in 4-6 week after the loss. You can get pregnant soon after the loss, so use birth control right away. °Finally: °Make sure all your questions are answered before during and after any procedure. Follow up with medical care and family planning methods. ° °  ° ° °

## 2018-10-10 NOTE — Progress Notes (Signed)
   GYN VISIT Patient name: Priscilla Castillo MRN [id number]  Date of birth: 08/31/1990 Chief Complaint:   Vaginal Bleeding (possible miscarriage)  History of Present Illness:   Priscilla Castillo is a 28 y.o. [phone] African American female being seen today for +HPT x 6. LMP 7/27. Started bleeding/cramping on & off about about a week ago, was heavier one day, passed a clot later that evening, has still been bleeding & cramping on & off. Has appt tomorrow at clinic in [place] for EAB, doesn't want to go if body is spontaneously miscarrying.  Was told father of this baby has gonorrhea. Is having some itching/odor/irritation.   Patient's last menstrual period was 08/21/2018. Review of Systems:   Pertinent items are noted in HPI Denies fever/chills, dizziness, headaches, visual disturbances, fatigue, shortness of breath, chest pain, abdominal pain, vomiting, abnormal vaginal discharge/itching/odor/irritation, problems with periods, bowel movements, urination, or intercourse unless otherwise stated above.  Pertinent History Reviewed:  Reviewed past medical,surgical, social, obstetrical and family history.  Reviewed problem list, medications and allergies. Physical Assessment:   Vitals:   10/10/18 0928  BP: 120/76  Pulse: 96  Weight: 219 lb (99.3 kg)  Height: 5\' 1"  (1.549 m)  Body mass index is 41.38 kg/m.       Physical Examination:   General appearance: alert, well appearing, and in no distress  Mental status: alert, oriented to person, place, and time  Skin: warm & dry   Cardiovascular: normal heart rate noted  Respiratory: normal respiratory effort, no distress  Abdomen: soft, non-tender   Pelvic: VULVA: normal appearing vulva with no masses, tenderness or lesions, VAGINA: normal appearing vagina with normal color and discharge, no lesions, no bleeding noted CERVIX: normal appearing closed cervix without discharge or lesions  Extremities: no edema   Results for orders placed or  performed in visit on 10/10/18 (from the past 24 hour(s))  POCT urine pregnancy   Collection Time: 10/10/18  9:37 AM  Result Value Ref Range   Preg Test, Ur Positive (A) Negative    Assessment & Plan:  1) [redacted]w[redacted]d by LMP  2) Intermittent bleeding/cramping> will get hcg, abo/rh, cbc today  3) Exposure to gonorrhea> nuswab sent  Meds: No orders of the defined types were placed in this encounter.   Orders Placed This Encounter  Procedures  . US OB Comp Less 14 Wks  . CBC  . Beta hCG quant (ref lab)  . NuSwab Vaginitis Plus (VG+)  . Beta hCG quant (ref lab)  . POCT urine pregnancy  . ABO/Rh    Return for am for u/s, then thurs for hcg.  [place], [facility] 10/10/2018 10:08 AM

## 2018-10-11 ENCOUNTER — Other Ambulatory Visit: Payer: Self-pay

## 2018-10-11 ENCOUNTER — Telehealth: Payer: Self-pay | Admitting: *Deleted

## 2018-10-11 ENCOUNTER — Ambulatory Visit (INDEPENDENT_AMBULATORY_CARE_PROVIDER_SITE_OTHER): Payer: Medicaid Other

## 2018-10-11 ENCOUNTER — Telehealth: Payer: Self-pay | Admitting: Obstetrics & Gynecology

## 2018-10-11 DIAGNOSIS — Z3A08 8 weeks gestation of pregnancy: Secondary | ICD-10-CM | POA: Diagnosis not present

## 2018-10-11 DIAGNOSIS — O209 Hemorrhage in early pregnancy, unspecified: Secondary | ICD-10-CM

## 2018-10-11 LAB — CBC
Hematocrit: 37 % (ref 34.0–46.6)
Hemoglobin: 12.3 g/dL (ref 11.1–15.9)
MCH: 28.3 pg (ref 26.6–33.0)
MCHC: 33.2 g/dL (ref 31.5–35.7)
MCV: 85 fL (ref 79–97)
Platelets: 273 10*3/uL (ref 150–450)
RBC: 4.35 x10E6/uL (ref 3.77–5.28)
RDW: 14.4 % (ref 11.7–15.4)
WBC: 10.4 10*3/uL (ref 3.4–10.8)

## 2018-10-11 LAB — ABO/RH: Rh Factor: POSITIVE

## 2018-10-11 LAB — BETA HCG QUANT (REF LAB): hCG Quant: 122958 m[IU]/mL

## 2018-10-11 NOTE — Telephone Encounter (Signed)
Called patient regarding appointment scheduled in our office encouraged to come alone to the visit if possible, however, a support person, over age 28, may accompany her  to appointment if assistance is needed for safety or care concerns. Otherwise, support persons should remain outside until the visit is complete.  ° °We ask if you have had any exposure to anyone suspected or confirmed of having COVID-19 or if you are experiencing any of the following, to call and reschedule your appointment: fever, cough, shortness of breath, muscle pain, diarrhea, rash, vomiting, abdominal pain, red eye, weakness, bruising, bleeding, joint pain, or a severe headache.  ° °Please know we will ask you these questions or similar questions when you arrive for your appointment and again it’s how we are keeping everyone safe.   ° °Also,to keep you safe, please use the provided hand sanitizer when you enter the office. We are asking everyone in the office to wear a mask to help prevent the spread of °germs. If you have a mask of your own, please wear it to your appointment, if not, we are happy to provide one for you. ° °Thank you for understanding and your cooperation.  ° ° °[facility] Staff ° ° ° °

## 2018-10-11 NOTE — Progress Notes (Signed)
[place] 8+3 wks,single IUP,positive fht 173 bpm,normal ovaries bilat,crl 19.65 mm,subchorionic hemorrhage 2.3 x .3 x 2.1 cm

## 2018-10-11 NOTE — Telephone Encounter (Signed)
Wants results

## 2018-10-11 NOTE — Telephone Encounter (Signed)
I called patient back she wanted the nuswab results. Advised that it takes 7-10 days to come back.

## 2018-10-12 ENCOUNTER — Other Ambulatory Visit: Payer: Medicaid Other

## 2018-10-13 ENCOUNTER — Telehealth: Payer: Self-pay | Admitting: *Deleted

## 2018-10-13 NOTE — Telephone Encounter (Signed)
Pt advised NuSwab results are still pending. Advised can take 5-7 days and we should have results back next week. Pt voiced understanding. [place]

## 2018-10-13 NOTE — Telephone Encounter (Signed)
Did not leave reason for her call on nurse line.

## 2018-10-14 LAB — NUSWAB VAGINITIS PLUS (VG+)
Atopobium vaginae: HIGH Score — AB
Candida albicans, NAA: NEGATIVE
Candida glabrata, NAA: NEGATIVE
Chlamydia trachomatis, NAA: NEGATIVE
Neisseria gonorrhoeae, NAA: NEGATIVE
Trich vag by NAA: POSITIVE — AB

## 2018-10-16 ENCOUNTER — Telehealth: Payer: Self-pay | Admitting: *Deleted

## 2018-10-16 ENCOUNTER — Other Ambulatory Visit: Payer: Self-pay | Admitting: Women's Health

## 2018-10-16 DIAGNOSIS — A599 Trichomoniasis, unspecified: Secondary | ICD-10-CM | POA: Insufficient documentation

## 2018-10-16 MED ORDER — METRONIDAZOLE 500 MG PO TABS: 500.0000 mg | ORAL_TABLET | Freq: Two times a day (BID) | ORAL | 0 refills | Status: DC

## 2018-10-16 NOTE — Telephone Encounter (Signed)
Pt informed of results. Requested that med be sent to [facility] [address] instead. I cancelled order with [facility] and resent it to [facility].

## 2018-10-16 NOTE — Telephone Encounter (Signed)
-----   Message from Roma Schanz, [place] sent at 10/16/2018  9:18 AM EDT ----- Please let her know swab showed BV and trichomonas. I have sent rx to pharmacy. No sex or etoh while taking. Partner also needs tx, I can treat or he can go to pcp/hd. Needs poc self-swab in 4wks please. Maudie Mercury

## 2018-11-15 ENCOUNTER — Other Ambulatory Visit: Payer: Self-pay

## 2018-11-15 DIAGNOSIS — Z20822 Contact with and (suspected) exposure to covid-19: Secondary | ICD-10-CM

## 2018-11-16 LAB — NOVEL CORONAVIRUS, NAA: SARS-CoV-2, NAA: NOT DETECTED

## 2018-12-07 ENCOUNTER — Other Ambulatory Visit: Payer: Self-pay

## 2018-12-07 ENCOUNTER — Emergency Department (HOSPITAL_COMMUNITY)
Admission: EM | Admit: 2018-12-07 | Discharge: 2018-12-07 | Disposition: A | Discharge status: Home / Self Care | Payer: Medicaid Other | Attending: Emergency Medicine | Admitting: Emergency Medicine

## 2018-12-07 ENCOUNTER — Encounter (HOSPITAL_COMMUNITY): Payer: Self-pay

## 2018-12-07 DIAGNOSIS — I10 Essential (primary) hypertension: Secondary | ICD-10-CM | POA: Insufficient documentation

## 2018-12-07 DIAGNOSIS — F17210 Nicotine dependence, cigarettes, uncomplicated: Secondary | ICD-10-CM | POA: Insufficient documentation

## 2018-12-07 DIAGNOSIS — T7840XA Allergy, unspecified, initial encounter: Secondary | ICD-10-CM | POA: Insufficient documentation

## 2018-12-07 DIAGNOSIS — Z79899 Other long term (current) drug therapy: Secondary | ICD-10-CM | POA: Diagnosis not present

## 2018-12-07 DIAGNOSIS — L509 Urticaria, unspecified: Secondary | ICD-10-CM | POA: Diagnosis present

## 2018-12-07 DIAGNOSIS — Z91040 Latex allergy status: Secondary | ICD-10-CM | POA: Diagnosis not present

## 2018-12-07 DIAGNOSIS — T39395A Adverse effect of other nonsteroidal anti-inflammatory drugs [NSAID], initial encounter: Secondary | ICD-10-CM | POA: Diagnosis not present

## 2018-12-07 DIAGNOSIS — L299 Pruritus, unspecified: Secondary | ICD-10-CM | POA: Diagnosis not present

## 2018-12-07 MED ORDER — DIPHENHYDRAMINE HCL 25 MG PO TABS: 25.0000 mg | ORAL_TABLET | Freq: Four times a day (QID) | ORAL | 0 refills | Status: DC

## 2018-12-07 MED ORDER — DIPHENHYDRAMINE HCL 50 MG/ML IJ SOLN
50.0000 mg | Freq: Once | INTRAMUSCULAR | Status: AC
  Administered 2018-12-07: 50 mg via INTRAVENOUS
  Filled 2018-12-07: qty 1

## 2018-12-07 MED ORDER — PREDNISONE 10 MG PO TABS: 40.0000 mg | ORAL_TABLET | Freq: Every day | ORAL | 0 refills | Status: DC

## 2018-12-07 MED ORDER — FAMOTIDINE IN NACL 20-0.9 MG/50ML-% IV SOLN
20.0000 mg | Freq: Once | INTRAVENOUS | Status: AC
  Administered 2018-12-07: 20 mg via INTRAVENOUS
  Filled 2018-12-07: qty 50

## 2018-12-07 MED ORDER — PREDNISONE 50 MG PO TABS
60.0000 mg | ORAL_TABLET | Freq: Once | ORAL | Status: AC
  Administered 2018-12-07: 60 mg via ORAL
  Filled 2018-12-07: qty 1

## 2018-12-07 NOTE — ED Notes (Signed)
ED Provider at bedside. 

## 2018-12-07 NOTE — ED Provider Notes (Signed)
[facility] EMERGENCY DEPARTMENT Provider Note   CSN: [id number] Arrival date & time: 12/07/18  2132     History   Chief Complaint Chief Complaint  Patient presents with  . Allergic Reaction    HPI Priscilla Castillo is a 28 y.o. female.     Patient had around 8 PM started to have some hives and itching mostly arms back and face.  Felt as if the lips were swelling no tongue swelling felt as if she was having a little bit of difficulty breathing.  Brought in by EMS but not given any treatments.  Patient did not take any Benadryl at home.  Patient's had similar reaction to Motrin in the past.  And patient had taken Aleve shortly prior to the reaction.  Patient feels much better currently but is still itching.     Past Medical History:  Diagnosis Date  . GERD (gastroesophageal reflux disease)   . Hypertension   . Pelvic inflammatory disease (PID)     Patient Active Problem List   Diagnosis Date Noted  . Trichomonas infection 10/16/2018    Past Surgical History:  Procedure Laterality Date  . CESAREAN SECTION     x 2     OB History    Gravida  7   Para  3   Term  2   Preterm  1   AB  2   Living  3     SAB  2   TAB      Ectopic      Multiple  0   Live Births  3            Home Medications    Prior to Admission medications   Medication Sig Start Date End Date Taking? Authorizing Provider  diphenhydrAMINE (BENADRYL) 25 MG tablet Take 1 tablet (25 mg total) by mouth every 6 (six) hours. 12/07/18   Fredia Sorrow, MD  doxycycline (VIBRAMYCIN) 100 MG capsule Take 1 capsule (100 mg total) by mouth 2 (two) times daily. Patient not taking: Reported on 08/12/2017 07/08/17   Orpah Greek, MD  doxycycline (VIBRAMYCIN) 100 MG capsule Take 1 capsule (100 mg total) by mouth 2 (two) times daily. Patient not taking: Reported on 02/15/2018 09/15/17   Virgel Manifold, MD  metroNIDAZOLE (FLAGYL) 500 MG tablet Take 1 tablet (500 mg total) by mouth 2  (two) times daily. 10/16/18   Roma Schanz, CNM  predniSONE (DELTASONE) 10 MG tablet Take 4 tablets (40 mg total) by mouth daily. 12/07/18   Fredia Sorrow, MD  tolnaftate (TINACTIN) 1 % cream Apply 1 application topically 2 (two) times daily. Patient not taking: Reported on 08/12/2017 06/11/15   Lily Kocher, PA-C  traMADol (ULTRAM) 50 MG tablet Take 1 tablet (50 mg total) by mouth every 6 (six) hours as needed. Patient not taking: Reported on 02/15/2018 09/15/17   Virgel Manifold, MD    Family History No family history on file.  Social History Social History   Tobacco Use  . Smoking status: Current Every Day Smoker    Packs/day: 0.33    Types: Cigarettes  . Smokeless tobacco: Never Used  Substance Use Topics  . Alcohol use: Yes    Alcohol/week: 2.0 standard drinks    Types: 2 Glasses of wine per week    Comment: occ.   . Drug use: Not Currently    Types: Marijuana     Allergies   Shellfish allergy, Banana, and Latex   Review of Systems Review of  Systems  Constitutional: Negative for chills and fever.  HENT: Positive for facial swelling. Negative for congestion, rhinorrhea, sore throat, trouble swallowing and voice change.   Eyes: Negative for visual disturbance.  Respiratory: Negative for cough and shortness of breath.   Cardiovascular: Negative for chest pain and leg swelling.  Gastrointestinal: Negative for abdominal pain, diarrhea, nausea and vomiting.  Genitourinary: Negative for dysuria.  Musculoskeletal: Negative for back pain and neck pain.  Skin: Positive for rash.  Neurological: Negative for dizziness, light-headedness and headaches.  Hematological: Does not bruise/bleed easily.  Psychiatric/Behavioral: Negative for confusion.     Physical Exam Updated Vital Signs BP 116/75   Pulse 68   Temp 98.4 F (36.9 C) (Oral)   Resp 17   Ht 1.549 m (5\' 1" )   Wt 97.5 kg   LMP 11/27/2018 (Approximate)   SpO2 100%   Breastfeeding Unknown   BMI 40.62  kg/m   Physical Exam Vitals signs and nursing note reviewed.  Constitutional:      General: She is not in acute distress.    Appearance: Normal appearance. She is well-developed.  HENT:     Head: Normocephalic and atraumatic.     Mouth/Throat:     Mouth: Mucous membranes are moist.     Pharynx: Oropharynx is clear.     Comments: No tongue swelling no lip swelling Eyes:     Extraocular Movements: Extraocular movements intact.     Conjunctiva/sclera: Conjunctivae normal.     Pupils: Pupils are equal, round, and reactive to light.  Neck:     Musculoskeletal: Normal range of motion and neck supple.  Cardiovascular:     Rate and Rhythm: Normal rate and regular rhythm.     Heart sounds: No murmur.  Pulmonary:     Effort: Pulmonary effort is normal. No respiratory distress.     Breath sounds: Normal breath sounds. No wheezing or rhonchi.  Abdominal:     Palpations: Abdomen is soft.     Tenderness: There is no abdominal tenderness.  Musculoskeletal: Normal range of motion.  Skin:    General: Skin is warm and dry.     Findings: Erythema and rash present.     Comments: No hives currently but some erythema with itching.  Predominantly upper arms back and face.  Neurological:     General: No focal deficit present.     Mental Status: She is alert and oriented to person, place, and time.      ED Treatments / Results  Labs (all labs ordered are listed, but only abnormal results are displayed) Labs Reviewed - No data to display  EKG None  Radiology No results found.  Procedures Procedures (including critical care time)  Medications Ordered in ED Medications  famotidine (PEPCID) IVPB 20 mg premix (20 mg Intravenous New Bag/Given 12/07/18 2201)  predniSONE (DELTASONE) tablet 60 mg (60 mg Oral Given 12/07/18 2158)  diphenhydrAMINE (BENADRYL) injection 50 mg (50 mg Intravenous Given 12/07/18 2158)     Initial Impression / Assessment and Plan / ED Course  I have reviewed  the triage vital signs and the nursing notes.  Pertinent labs & imaging results that were available during my care of the patient were reviewed by me and considered in my medical decision making (see chart for details).       Allergic reaction most likely secondary to Aleve since she has had difficulty with Motrin in the past.  Patient needs to avoid nonsteroidals.  Patient treated here with Benadryl Pepcid and  prednisone.  All symptoms resolved.  When she arrived she had no longer had lip swelling there was no evidence of any tongue swelling.  There was no wheezing.  No throat tightness.  Stable for discharge home.  Patient will be continued on prednisone for 5 days and Benadryl for the next 2 days.   Final Clinical Impressions(s) / ED Diagnoses   Final diagnoses:  Allergic reaction, initial encounter    ED Discharge Orders         Ordered    predniSONE (DELTASONE) 10 MG tablet  Daily     12/07/18 2227    diphenhydrAMINE (BENADRYL) 25 MG tablet  Every 6 hours     12/07/18 2227           Vanetta MuldersZackowski, Aviah Sorci, MD 12/07/18 2232

## 2018-12-07 NOTE — ED Triage Notes (Signed)
Pt started itching and having some hives after taking aleve approx 8 pm.  Symptoms started approx 58mins after taking the alleve

## 2018-12-07 NOTE — Discharge Instructions (Signed)
Take the Benadryl 25 mg every 6 hours for the next 2 days.  Take the prednisone for the next 5 days.  Avoid medicines like Motrin Naprosyn Aleve Advil.  You can take Tylenol as needed.  Return for any new or worse symptoms.  Return for any tongue swelling, any feeling of the throat closing off, or any difficulty breathing.

## 2019-01-01 DIAGNOSIS — Z5181 Encounter for therapeutic drug level monitoring: Secondary | ICD-10-CM | POA: Diagnosis not present

## 2019-01-03 DIAGNOSIS — Z5181 Encounter for therapeutic drug level monitoring: Secondary | ICD-10-CM | POA: Diagnosis not present

## 2019-01-08 DIAGNOSIS — Z5181 Encounter for therapeutic drug level monitoring: Secondary | ICD-10-CM | POA: Diagnosis not present

## 2019-01-10 DIAGNOSIS — Z5181 Encounter for therapeutic drug level monitoring: Secondary | ICD-10-CM | POA: Diagnosis not present

## 2019-01-12 ENCOUNTER — Other Ambulatory Visit: Payer: Self-pay

## 2019-01-12 ENCOUNTER — Ambulatory Visit: Payer: Medicaid Other | Attending: Internal Medicine

## 2019-01-12 DIAGNOSIS — Z20822 Contact with and (suspected) exposure to covid-19: Secondary | ICD-10-CM

## 2019-01-12 DIAGNOSIS — Z20828 Contact with and (suspected) exposure to other viral communicable diseases: Secondary | ICD-10-CM | POA: Diagnosis not present

## 2019-01-14 ENCOUNTER — Telehealth: Payer: Self-pay

## 2019-01-14 LAB — NOVEL CORONAVIRUS, NAA: SARS-CoV-2, NAA: DETECTED — AB

## 2019-01-14 NOTE — Telephone Encounter (Signed)
Called pt and LM on VM to call back for covid results. Will notify HD.

## 2019-01-15 ENCOUNTER — Telehealth: Payer: Self-pay | Admitting: Nurse Practitioner

## 2019-01-15 DIAGNOSIS — Z5181 Encounter for therapeutic drug level monitoring: Secondary | ICD-10-CM | POA: Diagnosis not present

## 2019-01-15 NOTE — Telephone Encounter (Signed)
Called to Discuss with patient about Covid symptoms and the use of bamlanivimab, a monoclonal antibody infusion for those with mild to moderate Covid symptoms and at a high risk of hospitalization.     Pt is qualified for this infusion at the [facility] infusion center due to co-morbid conditions and/or a member of an at-risk group.     Patient Active Problem List   Diagnosis Date Noted  . Trichomonas infection 10/16/2018  BMI greater than 35  Patient declines infusion at this time. Symptoms tier reviewed as well as criteria for ending isolation. Preventative practices reviewed. Patient verbalized understanding.    Patient advised to call back if he decides that he does want to get infusion. Callback number to the infusion center given. Patient advised to go to Urgent care or ED with severe symptoms.   Note: symptoms started on 01/08/19

## 2019-01-22 DIAGNOSIS — Z5181 Encounter for therapeutic drug level monitoring: Secondary | ICD-10-CM | POA: Diagnosis not present

## 2019-01-24 DIAGNOSIS — Z5181 Encounter for therapeutic drug level monitoring: Secondary | ICD-10-CM | POA: Diagnosis not present

## 2019-01-29 DIAGNOSIS — Z5181 Encounter for therapeutic drug level monitoring: Secondary | ICD-10-CM | POA: Diagnosis not present

## 2019-01-31 DIAGNOSIS — Z5181 Encounter for therapeutic drug level monitoring: Secondary | ICD-10-CM | POA: Diagnosis not present

## 2019-02-05 DIAGNOSIS — Z5181 Encounter for therapeutic drug level monitoring: Secondary | ICD-10-CM | POA: Diagnosis not present

## 2019-02-07 DIAGNOSIS — Z5181 Encounter for therapeutic drug level monitoring: Secondary | ICD-10-CM | POA: Diagnosis not present

## 2019-02-13 DIAGNOSIS — Z5181 Encounter for therapeutic drug level monitoring: Secondary | ICD-10-CM | POA: Diagnosis not present

## 2019-02-15 DIAGNOSIS — Z5181 Encounter for therapeutic drug level monitoring: Secondary | ICD-10-CM | POA: Diagnosis not present

## 2019-02-20 DIAGNOSIS — Z5181 Encounter for therapeutic drug level monitoring: Secondary | ICD-10-CM | POA: Diagnosis not present

## 2019-02-22 DIAGNOSIS — Z5181 Encounter for therapeutic drug level monitoring: Secondary | ICD-10-CM | POA: Diagnosis not present

## 2019-02-27 DIAGNOSIS — Z5181 Encounter for therapeutic drug level monitoring: Secondary | ICD-10-CM | POA: Diagnosis not present

## 2019-03-01 DIAGNOSIS — Z5181 Encounter for therapeutic drug level monitoring: Secondary | ICD-10-CM | POA: Diagnosis not present

## 2019-03-06 DIAGNOSIS — Z5181 Encounter for therapeutic drug level monitoring: Secondary | ICD-10-CM | POA: Diagnosis not present

## 2019-03-08 DIAGNOSIS — Z5181 Encounter for therapeutic drug level monitoring: Secondary | ICD-10-CM | POA: Diagnosis not present

## 2019-03-13 DIAGNOSIS — Z5181 Encounter for therapeutic drug level monitoring: Secondary | ICD-10-CM | POA: Diagnosis not present

## 2019-03-15 DIAGNOSIS — Z5181 Encounter for therapeutic drug level monitoring: Secondary | ICD-10-CM | POA: Diagnosis not present

## 2019-03-19 DIAGNOSIS — Z5181 Encounter for therapeutic drug level monitoring: Secondary | ICD-10-CM | POA: Diagnosis not present

## 2019-03-21 DIAGNOSIS — Z5181 Encounter for therapeutic drug level monitoring: Secondary | ICD-10-CM | POA: Diagnosis not present

## 2019-03-27 DIAGNOSIS — Z5181 Encounter for therapeutic drug level monitoring: Secondary | ICD-10-CM | POA: Diagnosis not present

## 2019-03-29 DIAGNOSIS — Z5181 Encounter for therapeutic drug level monitoring: Secondary | ICD-10-CM | POA: Diagnosis not present

## 2019-08-03 ENCOUNTER — Other Ambulatory Visit: Payer: Self-pay

## 2019-08-03 ENCOUNTER — Emergency Department (HOSPITAL_COMMUNITY)
Admission: EM | Admit: 2019-08-03 | Discharge: 2019-08-04 | Disposition: A | Discharge status: Home / Self Care | Payer: Medicaid Other | Attending: Emergency Medicine | Admitting: Emergency Medicine

## 2019-08-03 ENCOUNTER — Encounter (HOSPITAL_COMMUNITY): Payer: Self-pay

## 2019-08-03 DIAGNOSIS — K219 Gastro-esophageal reflux disease without esophagitis: Secondary | ICD-10-CM | POA: Insufficient documentation

## 2019-08-03 DIAGNOSIS — I10 Essential (primary) hypertension: Secondary | ICD-10-CM | POA: Insufficient documentation

## 2019-08-03 DIAGNOSIS — Z91040 Latex allergy status: Secondary | ICD-10-CM | POA: Insufficient documentation

## 2019-08-03 DIAGNOSIS — Z79899 Other long term (current) drug therapy: Secondary | ICD-10-CM | POA: Diagnosis not present

## 2019-08-03 DIAGNOSIS — F17210 Nicotine dependence, cigarettes, uncomplicated: Secondary | ICD-10-CM | POA: Diagnosis not present

## 2019-08-03 DIAGNOSIS — R109 Unspecified abdominal pain: Secondary | ICD-10-CM | POA: Diagnosis present

## 2019-08-03 DIAGNOSIS — N838 Other noninflammatory disorders of ovary, fallopian tube and broad ligament: Secondary | ICD-10-CM | POA: Diagnosis not present

## 2019-08-03 DIAGNOSIS — K8020 Calculus of gallbladder without cholecystitis without obstruction: Secondary | ICD-10-CM | POA: Diagnosis not present

## 2019-08-03 DIAGNOSIS — K429 Umbilical hernia without obstruction or gangrene: Secondary | ICD-10-CM | POA: Diagnosis not present

## 2019-08-03 DIAGNOSIS — R1084 Generalized abdominal pain: Secondary | ICD-10-CM | POA: Diagnosis not present

## 2019-08-03 DIAGNOSIS — R110 Nausea: Secondary | ICD-10-CM | POA: Diagnosis not present

## 2019-08-03 DIAGNOSIS — N390 Urinary tract infection, site not specified: Secondary | ICD-10-CM | POA: Insufficient documentation

## 2019-08-03 LAB — CBC
HCT: 44.3 % (ref 36.0–46.0)
Hemoglobin: 13.6 g/dL (ref 12.0–15.0)
MCH: 27.8 pg (ref 26.0–34.0)
MCHC: 30.7 g/dL (ref 30.0–36.0)
MCV: 90.4 fL (ref 80.0–100.0)
Platelets: 268 10*3/uL (ref 150–400)
RBC: 4.9 MIL/uL (ref 3.87–5.11)
RDW: 15.3 % (ref 11.5–15.5)
WBC: 12.7 10*3/uL — ABNORMAL HIGH (ref 4.0–10.5)
nRBC: 0 % (ref 0.0–0.2)

## 2019-08-03 LAB — COMPREHENSIVE METABOLIC PANEL
ALT: 18 U/L (ref 0–44)
AST: 20 U/L (ref 15–41)
Albumin: 3.9 g/dL (ref 3.5–5.0)
Alkaline Phosphatase: 63 U/L (ref 38–126)
Anion gap: 8 (ref 5–15)
BUN: 10 mg/dL (ref 6–20)
CO2: 28 mmol/L (ref 22–32)
Calcium: 8.9 mg/dL (ref 8.9–10.3)
Chloride: 104 mmol/L (ref 98–111)
Creatinine, Ser: 1.1 mg/dL — ABNORMAL HIGH (ref 0.44–1.00)
GFR calc Af Amer: >60 mL/min (ref >60)
GFR calc non Af Amer: >60 mL/min (ref >60)
Glucose, Bld: 89 mg/dL (ref 70–99)
Potassium: 3.7 mmol/L (ref 3.5–5.1)
Sodium: 140 mmol/L (ref 135–145)
Total Bilirubin: 1.4 mg/dL — ABNORMAL HIGH (ref 0.3–1.2)
Total Protein: 7.4 g/dL (ref 6.5–8.1)

## 2019-08-03 LAB — LIPASE, BLOOD: Lipase: 18 U/L (ref 11–51)

## 2019-08-03 MED ORDER — ONDANSETRON HCL 4 MG/2ML IJ SOLN
4.0000 mg | Freq: Once | INTRAMUSCULAR | Status: AC
  Administered 2019-08-03: 4 mg via INTRAVENOUS
  Filled 2019-08-03: qty 2

## 2019-08-03 MED ORDER — MORPHINE SULFATE (PF) 4 MG/ML IV SOLN
4.0000 mg | Freq: Once | INTRAVENOUS | Status: AC
  Administered 2019-08-03: 4 mg via INTRAVENOUS
  Filled 2019-08-03: qty 1

## 2019-08-03 MED ORDER — METOCLOPRAMIDE HCL 5 MG/ML IJ SOLN
10.0000 mg | Freq: Once | INTRAMUSCULAR | Status: AC
  Administered 2019-08-03: 10 mg via INTRAVENOUS
  Filled 2019-08-03: qty 2

## 2019-08-03 MED ORDER — SODIUM CHLORIDE 0.9 % IV BOLUS
1000.0000 mL | Freq: Once | INTRAVENOUS | Status: AC
  Administered 2019-08-03: 1000 mL via INTRAVENOUS

## 2019-08-03 NOTE — ED Triage Notes (Signed)
Pt to er, pt states that she is having abd pain, states that her pain comes and goes, reports upper abd pain and flank pain.  Pt states that she can't keep food down.  Pt states that the pain has been off and on for the past few days, states that today it was worse.  States that she has never had anything like this before.

## 2019-08-03 NOTE — ED Triage Notes (Signed)
Pt to er via EMS, per ems pt is here for abd pain.  Called pt from waiting room, no answer.

## 2019-08-04 ENCOUNTER — Emergency Department (HOSPITAL_COMMUNITY): Payer: Medicaid Other

## 2019-08-04 DIAGNOSIS — N838 Other noninflammatory disorders of ovary, fallopian tube and broad ligament: Secondary | ICD-10-CM | POA: Diagnosis not present

## 2019-08-04 DIAGNOSIS — K429 Umbilical hernia without obstruction or gangrene: Secondary | ICD-10-CM | POA: Diagnosis not present

## 2019-08-04 LAB — URINALYSIS, ROUTINE W REFLEX MICROSCOPIC
Bilirubin Urine: NEGATIVE
Glucose, UA: NEGATIVE mg/dL
Hgb urine dipstick: NEGATIVE
Ketones, ur: 20 mg/dL — AB
Nitrite: NEGATIVE
Protein, ur: 30 mg/dL — AB
Specific Gravity, Urine: 1.017 (ref 1.005–1.030)
WBC, UA: >50 WBC/hpf — ABNORMAL HIGH (ref 0–5)
pH: 7 (ref 5.0–8.0)

## 2019-08-04 LAB — POC URINE PREG, ED: Preg Test, Ur: NEGATIVE

## 2019-08-04 MED ORDER — IOHEXOL 300 MG/ML  SOLN
100.0000 mL | Freq: Once | INTRAMUSCULAR | Status: AC | PRN
  Administered 2019-08-04: 100 mL via INTRAVENOUS

## 2019-08-04 MED ORDER — HYDROMORPHONE HCL 1 MG/ML IJ SOLN
1.0000 mg | Freq: Once | INTRAMUSCULAR | Status: AC
  Administered 2019-08-04: 1 mg via INTRAVENOUS
  Filled 2019-08-04: qty 1

## 2019-08-04 MED ORDER — SODIUM CHLORIDE 0.9 % IV SOLN
1.0000 g | Freq: Once | INTRAVENOUS | Status: AC
  Administered 2019-08-04: 1 g via INTRAVENOUS
  Filled 2019-08-04: qty 10

## 2019-08-04 MED ORDER — ONDANSETRON HCL 4 MG/2ML IJ SOLN
4.0000 mg | Freq: Once | INTRAMUSCULAR | Status: AC
  Administered 2019-08-04: 4 mg via INTRAVENOUS
  Filled 2019-08-04: qty 2

## 2019-08-04 MED ORDER — CEPHALEXIN 500 MG PO CAPS
500.0000 mg | ORAL_CAPSULE | Freq: Two times a day (BID) | ORAL | 0 refills | Status: DC
Start: 2019-08-04 — End: 2019-12-25

## 2019-08-04 MED ORDER — ONDANSETRON HCL 4 MG PO TABS
4.0000 mg | ORAL_TABLET | Freq: Four times a day (QID) | ORAL | 0 refills | Status: DC
Start: 2019-08-04 — End: 2019-12-25

## 2019-08-04 MED ORDER — OXYCODONE-ACETAMINOPHEN 5-325 MG PO TABS: 1.0000 | ORAL_TABLET | ORAL | 0 refills | Status: DC | PRN

## 2019-08-04 NOTE — ED Notes (Signed)
Patient given a cup of water to drink at this time.

## 2019-08-04 NOTE — ED Provider Notes (Signed)
[facility] Provider Note   CSN: [id number] Arrival date & time: 08/03/19  1600     History Chief Complaint  Patient presents with  . Abdominal Pain    Priscilla Castillo is a 29 y.o. female.  Patient presents to the emergency department for evaluation of abdominal pain.  Patient reports that the pain started on both of her sides and now is more in the center of the upper abdomen.  This has been ongoing for a couple of days.  She has had associated nausea and vomiting.  Cannot eat or drink because of the vomiting.        Past Medical History:  Diagnosis Date  . GERD (gastroesophageal reflux disease)   . Hypertension   . Pelvic inflammatory disease (PID)     Patient Active Problem List   Diagnosis Date Noted  . Trichomonas infection 10/16/2018    Past Surgical History:  Procedure Laterality Date  . CESAREAN SECTION     x 2     OB History    Gravida  7   Para  3   Term  2   Preterm  1   AB  2   Living  3     SAB  2   TAB      Ectopic      Multiple  0   Live Births  3           History reviewed. No pertinent family history.  Social History   Tobacco Use  . Smoking status: Current Every Day Smoker    Packs/day: 0.33    Types: Cigarettes  . Smokeless tobacco: Never Used  Vaping Use  . Vaping Use: Never used  Substance Use Topics  . Alcohol use: Yes    Alcohol/week: 2.0 standard drinks    Types: 2 Glasses of wine per week    Comment: occ.   . Drug use: Not Currently    Types: Marijuana    Home Medications Prior to Admission medications   Medication Sig Start Date End Date Taking? Authorizing Provider  cephALEXin (KEFLEX) 500 MG capsule Take 1 capsule (500 mg total) by mouth 2 (two) times daily. 08/04/19   Gilda CreasePollina, Jasani Dolney J, MD  diphenhydrAMINE (BENADRYL) 25 MG tablet Take 1 tablet (25 mg total) by mouth every 6 (six) hours. 12/07/18   Vanetta MuldersZackowski, Scott, MD  doxycycline (VIBRAMYCIN) 100 MG capsule Take 1  capsule (100 mg total) by mouth 2 (two) times daily. Patient not taking: Reported on 08/12/2017 07/08/17   Gilda CreasePollina, Carmellia Kreisler J, MD  doxycycline (VIBRAMYCIN) 100 MG capsule Take 1 capsule (100 mg total) by mouth 2 (two) times daily. Patient not taking: Reported on 02/15/2018 09/15/17   Raeford RazorKohut, Stephen, MD  metroNIDAZOLE (FLAGYL) 500 MG tablet Take 1 tablet (500 mg total) by mouth 2 (two) times daily. 10/16/18   Cheral MarkerBooker, Kimberly R, CNM  ondansetron (ZOFRAN) 4 MG tablet Take 1 tablet (4 mg total) by mouth every 6 (six) hours. 08/04/19   Gilda CreasePollina, Charles Andringa J, MD  oxyCODONE-acetaminophen (PERCOCET) 5-325 MG tablet Take 1 tablet by mouth every 4 (four) hours as needed. 08/04/19   Gilda CreasePollina, Ina Poupard J, MD  predniSONE (DELTASONE) 10 MG tablet Take 4 tablets (40 mg total) by mouth daily. 12/07/18   Vanetta MuldersZackowski, Scott, MD  tolnaftate (TINACTIN) 1 % cream Apply 1 application topically 2 (two) times daily. Patient not taking: Reported on 08/12/2017 06/11/15   Ivery QualeBryant, Hobson, PA-C  traMADol (ULTRAM) 50 MG tablet Take 1 tablet (  50 mg total) by mouth every 6 (six) hours as needed. Patient not taking: Reported on 02/15/2018 09/15/17   Raeford Razor, MD    Allergies    Shellfish allergy, Banana, Latex, and Nsaids  Review of Systems   Review of Systems  Gastrointestinal: Positive for abdominal pain, nausea and vomiting.  All other systems reviewed and are negative.   Physical Exam Updated Vital Signs BP 126/68 (BP Location: Left Arm)   Pulse 76   Temp 99 F (37.2 C) (Oral)   Resp 18   Ht 5\' 1"  (1.549 m)   Wt 95.3 kg   LMP 08/21/2018   SpO2 100%   BMI 39.68 kg/m   Physical Exam Vitals and nursing note reviewed.  Constitutional:      General: She is not in acute distress.    Appearance: Normal appearance. She is well-developed.  HENT:     Head: Normocephalic and atraumatic.     Right Ear: Hearing normal.     Left Ear: Hearing normal.     Nose: Nose normal.  Eyes:     Conjunctiva/sclera:  Conjunctivae normal.     Pupils: Pupils are equal, round, and reactive to light.  Cardiovascular:     Rate and Rhythm: Regular rhythm.     Heart sounds: S1 normal and S2 normal. No murmur heard.  No friction rub. No gallop.   Pulmonary:     Effort: Pulmonary effort is normal. No respiratory distress.     Breath sounds: Normal breath sounds.  Chest:     Chest wall: No tenderness.  Abdominal:     General: Bowel sounds are normal.     Palpations: Abdomen is soft.     Tenderness: There is abdominal tenderness in the epigastric area. There is no guarding or rebound. Negative signs include Murphy's sign and McBurney's sign.     Hernia: No hernia is present.  Musculoskeletal:        General: Normal range of motion.     Cervical back: Normal range of motion and neck supple.  Skin:    General: Skin is warm and dry.     Findings: No rash.  Neurological:     Mental Status: She is alert and oriented to person, place, and time.     GCS: GCS eye subscore is 4. GCS verbal subscore is 5. GCS motor subscore is 6.     Cranial Nerves: No cranial nerve deficit.     Sensory: No sensory deficit.     Coordination: Coordination normal.  Psychiatric:        Speech: Speech normal.        Behavior: Behavior normal.        Thought Content: Thought content normal.     ED Results / Procedures / Treatments   Labs (all labs ordered are listed, but only abnormal results are displayed) Labs Reviewed  COMPREHENSIVE METABOLIC PANEL - Abnormal; Notable for the following components:      Result Value   Creatinine, Ser 1.10 (*)    Total Bilirubin 1.4 (*)    All other components within normal limits  CBC - Abnormal; Notable for the following components:   WBC 12.7 (*)    All other components within normal limits  URINALYSIS, ROUTINE W REFLEX MICROSCOPIC - Abnormal; Notable for the following components:   APPearance HAZY (*)    Ketones, ur 20 (*)    Protein, ur 30 (*)    Leukocytes,Ua MODERATE (*)    WBC,  UA >50 (*)  Bacteria, UA RARE (*)    All other components within normal limits  LIPASE, BLOOD  POC URINE PREG, ED    EKG None  Radiology CT ABDOMEN PELVIS W CONTRAST  Result Date: 08/04/2019 CLINICAL DATA:  Abdominal pain. EXAM: CT ABDOMEN AND PELVIS WITH CONTRAST TECHNIQUE: Multidetector CT imaging of the abdomen and pelvis was performed using the standard protocol following bolus administration of intravenous contrast. CONTRAST:  OMNIPAQUE IOHEXOL 300 MG/ML  SOLN COMPARISON:  February 15, 2018 FINDINGS: Lower chest: No acute abnormality. Hepatobiliary: No focal liver abnormality is seen. A tiny gallstone is seen within the gallbladder lumen without evidence of gallbladder wall thickening or biliary dilatation. Pancreas: Unremarkable. No pancreatic ductal dilatation or surrounding inflammatory changes. Spleen: Normal in size without focal abnormality. Adrenals/Urinary Tract: Adrenal glands are unremarkable. Kidneys are normal, without renal calculi, focal lesion, or hydronephrosis. The urinary bladder is empty and subsequently limited in evaluation. Stomach/Bowel: Stomach is within normal limits. Appendix appears normal. No evidence of bowel wall thickening, distention, or inflammatory changes. Vascular/Lymphatic: No significant vascular findings are present. No enlarged abdominal or pelvic lymph nodes. Reproductive: The uterus is normal in appearance. A 1.9 cm x 1.5 cm left adnexal cyst is seen. Other: There is a stable 1.9 cm x 0.8 cm fat containing umbilical hernia. No abdominopelvic ascites. Musculoskeletal: No acute or significant osseous findings. IMPRESSION: 1. Cholelithiasis. 2. Small left adnexal cyst, likely ovarian in origin. Electronically Signed   By: Aram Candela M.D.   On: 08/04/2019 03:32    Procedures Procedures (including critical care time)  Medications Ordered in ED Medications  HYDROmorphone (DILAUDID) injection 1 mg (has no administration in time range)    ondansetron (ZOFRAN) injection 4 mg (has no administration in time range)  cefTRIAXone (ROCEPHIN) 1 g in sodium chloride 0.9 % 100 mL IVPB (has no administration in time range)  morphine 4 MG/ML injection 4 mg (4 mg Intravenous Given 08/03/19 2356)  ondansetron (ZOFRAN) injection 4 mg (4 mg Intravenous Given 08/03/19 2356)  sodium chloride 0.9 % bolus 1,000 mL (1,000 mLs Intravenous New Bag/Given 08/03/19 2356)  metoCLOPramide (REGLAN) injection 10 mg (10 mg Intravenous Given 08/03/19 2356)  iohexol (OMNIPAQUE) 300 MG/ML solution 100 mL (100 mLs Intravenous Contrast Given 08/04/19 0310)    ED Course  I have reviewed the triage vital signs and the nursing notes.  Pertinent labs & imaging results that were available during my care of the patient were reviewed by me and considered in my medical decision making (see chart for details).    MDM Rules/Calculators/A&P                          Patient presents with abdominal pain nausea and vomiting.  She has predominantly epigastric pain and this is where she is experiencing tenderness.  No Murphy sign.  Blood work was normal except for slight leukocytosis at 12.7.  Urinalysis suggest infection.  This does not satisfactorily explain epigastric pain, however.  Patient therefore underwent CT scan, as ultrasound is not available.  This does show cholelithiasis without signs of cholecystitis.  Symptoms might be secondary to biliary colic.  This was shared with the patient.  She will need to follow-up with general surgery as an outpatient.  Will provide analgesia.  She was given cholelithiasis instructions. Final Clinical Impression(s) / ED Diagnoses Final diagnoses:  Urinary tract infection without hematuria, site unspecified  Calculus of gallbladder without cholecystitis without obstruction    Rx / DC  Orders ED Discharge Orders         Ordered    oxyCODONE-acetaminophen (PERCOCET) 5-325 MG tablet  Every 4 hours PRN     Discontinue  Reprint     08/04/19  0358    ondansetron (ZOFRAN) 4 MG tablet  Every 6 hours     Discontinue  Reprint     08/04/19 0359    cephALEXin (KEFLEX) 500 MG capsule  2 times daily     Discontinue  Reprint     08/04/19 0359           Gilda Crease, MD 08/04/19 [phone]

## 2019-08-06 MED FILL — Oxycodone w/ Acetaminophen Tab 5-325 MG: ORAL | Qty: 6 | Status: AC

## 2019-08-09 ENCOUNTER — Ambulatory Visit: Payer: Medicaid Other | Admitting: General Surgery

## 2019-12-25 ENCOUNTER — Ambulatory Visit (INDEPENDENT_AMBULATORY_CARE_PROVIDER_SITE_OTHER): Payer: Medicaid Other | Admitting: Adult Health

## 2019-12-25 ENCOUNTER — Encounter: Payer: Self-pay | Admitting: Adult Health

## 2019-12-25 ENCOUNTER — Other Ambulatory Visit: Payer: Self-pay

## 2019-12-25 ENCOUNTER — Other Ambulatory Visit (HOSPITAL_COMMUNITY)
Admission: RE | Admit: 2019-12-25 | Discharge: 2019-12-25 | Disposition: A | Discharge status: Home / Self Care | Payer: Medicaid Other | Source: Ambulatory Visit | Attending: Adult Health | Admitting: Adult Health

## 2019-12-25 VITALS — BP 134/87 | HR 103 | Ht 61.0 in | Wt 207.0 lb

## 2019-12-25 DIAGNOSIS — N898 Other specified noninflammatory disorders of vagina: Secondary | ICD-10-CM | POA: Diagnosis not present

## 2019-12-25 DIAGNOSIS — N750 Cyst of Bartholin's gland: Secondary | ICD-10-CM

## 2019-12-25 MED ORDER — SULFAMETHOXAZOLE-TRIMETHOPRIM 800-160 MG PO TABS
1.0000 | ORAL_TABLET | Freq: Two times a day (BID) | ORAL | 0 refills | Status: DC
Start: 2019-12-25 — End: 2019-12-28

## 2019-12-25 NOTE — Progress Notes (Signed)
°  Subjective:     Patient ID: Priscilla Castillo, female   DOB: 1990-09-18, 29 y.o.   MRN: [id number]  HPI Priscilla Castillo is a 29 year old black female single,,[id number], in complaining of cyst in vagina, actually has one on the left side for about 2 days that is sore and one on the right that is getting smaller but has been there about a week and has vaginal discharge. PCP is Dr Leandrew Koyanagi.  Review of Systems Has cyst in vagina +vaginal discharge Reviewed past medical,surgical, social and family history. Reviewed medications and allergies.     Objective:   Physical Exam BP 134/87 (BP Location: Left Arm, Patient Position: Sitting, Cuff Size: Large)    Pulse (!) 103    Ht 5\' 1"  (1.549 m)    Wt 207 lb (93.9 kg)    LMP 12/14/2019 (Approximate)    Breastfeeding No    BMI 39.11 kg/m    Skin warm and dry.Pelvic: external genitalia is has 3 cm swollen bartholin cyst at left introitus, draining purulent odorous fluid, area decompressed with pressure and resolved, culture obtrained, on the right has 3 cm round non tender,intact bartholin cyst,vagina:scant discharge without odor,urethra has no lesions or masses noted, cervix:smooth and bulbous, uterus: normal size, shape and contour, non tender, no masses felt, adnexa: no masses or tenderness noted. Bladder is non tender and no masses felt.  Fall risk is low  Upstream - 12/25/19 1431      Pregnancy Intention Screening   Does the patient want to become pregnant in the next year? No    Does the patient's partner want to become pregnant in the next year? No    Would the patient like to discuss contraceptive options today? No      Contraception Wrap Up   Current Method No Method - Other Reason    End Method No Method - Other Reason    Contraception Counseling Provided No         Examination chaperoned by 12/27/19 CMA.  Assessment:     1. Infected cyst of Bartholin's gland duct Use warm water and massage left gland Will rx septra ds Meds ordered  this encounter  Medications   sulfamethoxazole-trimethoprim (BACTRIM DS) 800-160 MG tablet    Sig: Take 1 tablet by mouth 2 (two) times daily. Take 1 bid    Dispense:  28 tablet    Refill:  0    Order Specific Question:   Supervising Provider    Answer:   Federico Flake [[id number]]  Culture sent Review handout on bartholin cyst   2. Vaginal discharge Cv swab sent for GC/CHL,trich,BV and yeast     Plan:     Follow up 01/02/20 for recheck   - try to get back on Mychart

## 2019-12-25 NOTE — Patient Instructions (Signed)
Bartholin's Cyst  A Bartholin's cyst is a fluid-filled sac that forms on a Bartholin's gland. Bartholin's glands are small glands in the folds of skin around the vaginal opening (labia). These glands produce a fluid to moisten (lubricate) the outside of the vagina during sex. A cyst that is not large or infected may not cause any problems or require treatment. If the cyst gets infected, it is called a Bartholin's abscess. An abscess may cause symptoms such as pain and swelling and is more likely to require treatment. What are the causes? This condition may be caused by a blocked Bartholin's gland. These glands can become blocked due to natural buildup of fluid and oils. Bacteria inside of the cyst can cause infection. In many cases, the cause is not known. What increases the risk? You may be at increased risk of developing a Bartholin's cyst or abscess if:  You are of childbearing age.  You have a history of Bartholin's cysts or abscesses.  You have diabetes.  You have an STI (sexually transmitted infection). What are the signs or symptoms? Symptoms may include:  A bulge or lump on the labia, near the lower opening of the vagina.  Discomfort or pain. This may get worse during sex or when walking.  Redness, swelling, or fluid draining from the area. These may be signs of an abscess. How severe your symptoms are depends on the size of your cyst and whether it is infected. Infection causes symptoms to get more severe. How is this diagnosed? This condition may be diagnosed based on:  Your symptoms and medical history.  A physical exam to check for swelling in your vaginal area. You may lie on your back on an exam table and have your feet placed into footrests for the exam.  Blood tests to check for infections.  Removal of a fluid sample from the cyst or abscess (biopsy) for testing. You may work with a health care provider who specializes in women's health (gynecologist) for diagnosis  and treatment. How is this treated? If your cyst is small, not infected, and not causing symptoms, you may not need any treatment. These cysts often go away on their own, with home care such as hot baths or warm compresses. If you have a large cyst or an abscess, treatment may include:  Antibiotic medicine.  A procedure to drain the fluid inside the cyst or abscess. These procedures involve making an incision in the cyst or abscess so that the fluid drains out, and then one of the following may be done: ? A small, thin tube (catheter) may be placed inside the cyst or abscess so that it does not close and fill up with fluid again (fistulization). The catheter will be removed at a follow-up visit. ? The edges of the incision may be stitched to your skin so that the cyst or abscess stays open (marsupialization). This allows it to continue to drain and not fill up with fluid again. If you have cysts or abscesses that keep returning (recurring) and have required incision and drainage multiple times, your health care provider may talk with you about surgery to remove the Bartholin's gland. Follow these instructions at home: Medicines  Take over-the-counter and prescription medicines only as told by your health care provider.  If you were prescribed an antibiotic medicine, take it as told by your health care provider. Do not stop taking the antibiotic even if your condition improves. Managing pain and swelling  Try sitz baths to help with   pain and swelling. A sitz bath is a warm water bath in which the water only comes up to your hips and should cover your buttocks. You may take sitz baths several times a day.  Apply heat to the affected area as often as needed. Use the heat source that your health care provider recommends, such as a moist heat pack or a heating pad. ? Place a towel between your skin and the heat source. ? Leave the heat on for 20-30 minutes. ? Remove the heat if your skin turns  bright red. This is especially important if you are unable to feel pain, heat, or cold. You may have a greater risk of getting burned. General instructions  If your cyst or abscess was drained, follow instructions from your health care provider about how to take care of your wound. Use feminine pads as needed to absorb any drainage.  Do not push on or squeeze your cyst.  Do not have sex until the cyst has gone away or your wound from drainage has healed.  Take these steps to help prevent a Bartholin's cyst from returning, and to prevent other Bartholin's cysts from developing: ? Take a bath or shower once a day. Clean your vaginal area with mild soap and water when you bathe. ? Practice safe sex to prevent STIs. Talk with your health care provider about how to prevent STIs and which forms of birth control (contraception) may be best for you.  Keep all follow-up visits as told by your health care provider. This is important. Contact a health care provider if:  You have a fever.  You develop redness, swelling, or pain around your cyst.  You have fluid, blood, pus, or a bad smell coming from your cyst.  You have a cyst that gets larger or comes back. Summary  A Bartholin's cyst is a fluid-filled sac that forms on a Bartholin's gland. These glands are in the folds of skin around the vaginal opening (labia).  If your cyst is small, not infected, and not causing symptoms, you may not need any treatment. These cysts often go away on their own, with home care such as hot baths or warm compresses.  If you have a large cyst or an abscess, your health care provider may perform a procedure to drain the fluid.  If you have cysts or abscesses that keep returning (recurring) and have required incision and drainage multiple times, your health care provider may talk with you about surgery to remove the Bartholin's gland. This information is not intended to replace advice given to you by your health  care provider. Make sure you discuss any questions you have with your health care provider. Document Revised: 11/03/2017 Document Reviewed: 10/13/2016 Elsevier Patient Education  2020 [employer]  

## 2019-12-26 ENCOUNTER — Telehealth: Payer: Self-pay | Admitting: Adult Health

## 2019-12-26 LAB — CERVICOVAGINAL ANCILLARY ONLY
Bacterial Vaginitis (gardnerella): POSITIVE — AB
Candida Glabrata: NEGATIVE
Candida Vaginitis: NEGATIVE
Chlamydia: NEGATIVE
Comment: NEGATIVE
Comment: NEGATIVE
Comment: NEGATIVE
Comment: NEGATIVE
Comment: NEGATIVE
Comment: NORMAL
Neisseria Gonorrhea: NEGATIVE
Trichomonas: NEGATIVE

## 2019-12-26 NOTE — Telephone Encounter (Signed)
Left bartholin cyst still draining but just started antibiotics this am, keep using warm compress and massaging and call me in am for follow up, may need to place WORD catheter

## 2019-12-26 NOTE — Telephone Encounter (Signed)
Pt calling to check on culture results & to report that the cyst that was drained yesterday is now painful & is still draining with pt assisting  Please advise & call pt (pt prefers a call due to issues with her MyChart)   [employer]

## 2019-12-27 ENCOUNTER — Telehealth: Payer: Self-pay | Admitting: Adult Health

## 2019-12-27 ENCOUNTER — Telehealth: Payer: Self-pay | Admitting: *Deleted

## 2019-12-27 ENCOUNTER — Other Ambulatory Visit: Payer: Self-pay | Admitting: Adult Health

## 2019-12-27 MED ORDER — AMPICILLIN 500 MG PO CAPS
500.0000 mg | ORAL_CAPSULE | Freq: Four times a day (QID) | ORAL | 0 refills | Status: DC
Start: 2019-12-27 — End: 2020-01-22

## 2019-12-27 MED ORDER — METRONIDAZOLE 500 MG PO TABS
500.0000 mg | ORAL_TABLET | Freq: Two times a day (BID) | ORAL | 0 refills | Status: DC
Start: 2019-12-27 — End: 2020-01-22

## 2019-12-27 NOTE — Telephone Encounter (Signed)
Returning your call.   [phone]
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
## 2019-12-27 NOTE — Telephone Encounter (Signed)
Left message to call me.

## 2019-12-27 NOTE — Progress Notes (Signed)
rx flagyl and ampicillin had +BV on vaginal swab and +strept on wound culture

## 2019-12-28 ENCOUNTER — Other Ambulatory Visit: Payer: Self-pay

## 2019-12-28 ENCOUNTER — Telehealth: Payer: Self-pay | Admitting: Adult Health

## 2019-12-28 ENCOUNTER — Ambulatory Visit (INDEPENDENT_AMBULATORY_CARE_PROVIDER_SITE_OTHER): Payer: Medicaid Other | Admitting: Adult Health

## 2019-12-28 ENCOUNTER — Encounter: Payer: Self-pay | Admitting: Adult Health

## 2019-12-28 VITALS — BP 137/93 | HR 93 | Ht 61.0 in | Wt 208.0 lb

## 2019-12-28 DIAGNOSIS — N750 Cyst of Bartholin's gland: Secondary | ICD-10-CM | POA: Diagnosis not present

## 2019-12-28 DIAGNOSIS — Z7689 Persons encountering health services in other specified circumstances: Secondary | ICD-10-CM | POA: Diagnosis not present
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
## 2019-12-28 NOTE — Telephone Encounter (Signed)
Take 3-4 ibuprofen and 1 tylenol and use frozen peas for 10 minutes and rest

## 2019-12-28 NOTE — Telephone Encounter (Signed)
Pt states since numbing wore off she's in pain - can't drive or sit due to pain  Ibuprofen isn't helping  Please advise & notify pt    [address]

## 2019-12-28 NOTE — Telephone Encounter (Signed)
Patient  states that she is in a lot of pain and it drained a little bit yesterday bit stopped. she has been in pain since in 8 pm. Pt states that she would like to have it cut open. Please contact pt
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
## 2019-12-31 ENCOUNTER — Ambulatory Visit: Payer: Medicaid Other | Admitting: Obstetrics & Gynecology

## 2019-12-31 LAB — WOUND CULTURE

## 2020-01-02 ENCOUNTER — Ambulatory Visit: Payer: Medicaid Other | Admitting: Adult Health

## 2020-01-11 ENCOUNTER — Ambulatory Visit: Payer: Medicaid Other | Admitting: Adult Health

## 2020-01-15 ENCOUNTER — Ambulatory Visit: Payer: Medicaid Other | Admitting: Adult Health

## 2020-01-21 ENCOUNTER — Encounter (HOSPITAL_COMMUNITY): Payer: Self-pay | Admitting: Emergency Medicine

## 2020-01-21 ENCOUNTER — Other Ambulatory Visit: Payer: Self-pay

## 2020-01-21 DIAGNOSIS — Z91040 Latex allergy status: Secondary | ICD-10-CM | POA: Diagnosis not present

## 2020-01-21 DIAGNOSIS — N750 Cyst of Bartholin's gland: Secondary | ICD-10-CM | POA: Diagnosis not present

## 2020-01-21 DIAGNOSIS — I10 Essential (primary) hypertension: Secondary | ICD-10-CM | POA: Diagnosis not present

## 2020-01-21 DIAGNOSIS — F17210 Nicotine dependence, cigarettes, uncomplicated: Secondary | ICD-10-CM | POA: Diagnosis not present
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
## 2020-01-21 NOTE — ED Triage Notes (Signed)
Pt c/o an abscess on her vagina. Pt is here for an I&D.

## 2020-01-22 ENCOUNTER — Emergency Department (HOSPITAL_COMMUNITY)
Admission: EM | Admit: 2020-01-22 | Discharge: 2020-01-22 | Disposition: A | Discharge status: Home / Self Care | Payer: Medicaid Other | Attending: Emergency Medicine | Admitting: Emergency Medicine

## 2020-01-22 DIAGNOSIS — N750 Cyst of Bartholin's gland: Secondary | ICD-10-CM

## 2020-01-22 MED ORDER — AMPICILLIN 500 MG PO CAPS
500.0000 mg | ORAL_CAPSULE | Freq: Four times a day (QID) | ORAL | 0 refills | Status: DC
Start: 2020-01-22 — End: 2020-09-04

## 2020-01-22 MED ORDER — METRONIDAZOLE 500 MG PO TABS
500.0000 mg | ORAL_TABLET | Freq: Two times a day (BID) | ORAL | 0 refills | Status: AC
Start: 2020-01-22 — End: 2020-02-01

## 2020-01-22 MED ORDER — OXYCODONE HCL 5 MG PO TABS
10.0000 mg | ORAL_TABLET | Freq: Once | ORAL | Status: AC
  Administered 2020-01-22: 10 mg via ORAL
  Filled 2020-01-22: qty 2

## 2020-01-22 MED ORDER — AMOXICILLIN 250 MG PO CAPS: 500.0000 mg | ORAL_CAPSULE | Freq: Once | ORAL | Status: DC

## 2020-01-22 MED ORDER — ACETAMINOPHEN 500 MG PO TABS
1000.0000 mg | ORAL_TABLET | Freq: Once | ORAL | Status: AC
  Administered 2020-01-22: 1000 mg via ORAL
  Filled 2020-01-22: qty 2

## 2020-01-22 MED ORDER — METRONIDAZOLE 500 MG PO TABS
500.0000 mg | ORAL_TABLET | Freq: Once | ORAL | Status: AC
  Administered 2020-01-22: 500 mg via ORAL
  Filled 2020-01-22: qty 1

## 2020-01-22 NOTE — ED Notes (Signed)
Assisted Dr. Clayborne Dana with I and D of right labial abcess. Assisted pt with pericare thereafter and reviewed sitz bath and home pericare and pain management. Pt verbalized understanding.

## 2020-01-22 NOTE — ED Provider Notes (Signed)
[facility] EMERGENCY DEPARTMENT Provider Note   CSN: [id number] Arrival date & time: 01/21/20  2041     History Chief Complaint  Patient presents with  . Abscess    Priscilla Castillo is a 29 y.o. female.  Patient has a history of abscesses and Bartholin gland cysts. She states that she had one on the right side of her labia at one point then it slowly got better without treatment than she had on her left side that needed a Ward catheter was on antibiotics for couple weeks. This stopped a couple weeks ago. Patient is back with another 1 on the right side. No fevers, malaise, nausea or vomiting recently.   The history is provided by the patient.  Abscess      Past Medical History:  Diagnosis Date  . GERD (gastroesophageal reflux disease)   . Hypertension   . Pelvic inflammatory disease (PID)     Patient Active Problem List   Diagnosis Date Noted  . Encounter for incision and drainage procedure 12/28/2019  . Vaginal discharge 12/25/2019  . Infected cyst of Bartholin's gland duct 12/25/2019  . Trichomonas infection 10/16/2018    Past Surgical History:  Procedure Laterality Date  . CESAREAN SECTION     x 2     OB History    Gravida  7   Para  3   Term  2   Preterm  1   AB  4   Living  3     SAB  2   IAB      Ectopic      Multiple  0   Live Births  3           History reviewed. No pertinent family history.  Social History   Tobacco Use  . Smoking status: Current Every Day Smoker    Packs/day: 0.33    Types: Cigarettes  . Smokeless tobacco: Never Used  Vaping Use  . Vaping Use: Never used  Substance Use Topics  . Alcohol use: Yes    Alcohol/week: 2.0 standard drinks    Types: 2 Glasses of wine per week    Comment: occ.   . Drug use: Not Currently    Types: Marijuana    Comment: denies    Home Medications Prior to Admission medications   Medication Sig Start Date End Date Taking? Authorizing Provider  metroNIDAZOLE (FLAGYL)  500 MG tablet Take 1 tablet (500 mg total) by mouth 2 (two) times daily for 10 days. One po bid x 7 days 01/22/20 02/01/20 Yes Ndeye Tenorio, Barbara Cower, MD  ampicillin (PRINCIPEN) 500 MG capsule Take 1 capsule (500 mg total) by mouth 4 (four) times daily. 01/22/20   Egon Dittus, Barbara Cower, MD  diphenhydrAMINE (BENADRYL) 25 MG tablet Take 1 tablet (25 mg total) by mouth every 6 (six) hours. Patient taking differently: Take 25 mg by mouth as needed.  12/07/18   Vanetta Mulders, MD    Allergies    Shellfish allergy, Banana, Latex, and Nsaids  Review of Systems   Review of Systems  All other systems reviewed and are negative.   Physical Exam Updated Vital Signs BP 135/63   Pulse 68   Temp 98 F (36.7 C) (Oral)   Resp 16   Ht 5\' 1"  (1.549 m)   Wt 94.3 kg   LMP 01/14/2020 (Approximate)   SpO2 100%   BMI 39.30 kg/m   Physical Exam Vitals and nursing note reviewed.  Constitutional:      Appearance: She  is well-developed and well-nourished.  HENT:     Head: Normocephalic and atraumatic.     Nose: No congestion or rhinorrhea.     Mouth/Throat:     Mouth: Mucous membranes are moist.  Eyes:     Pupils: Pupils are equal, round, and reactive to light.  Cardiovascular:     Rate and Rhythm: Normal rate and regular rhythm.  Pulmonary:     Effort: No respiratory distress.     Breath sounds: No stridor.  Abdominal:     General: There is no distension.  Genitourinary:    Labia:        Right: Tenderness and lesion present.      Comments: Chaperoned by nurse, Floria Raveling for entirety of exam and procedure.   Large area of fluctuance with surrounding ttp and erythema/edema to right distal vaginal wall Musculoskeletal:        General: No swelling or tenderness. Normal range of motion.     Cervical back: Normal range of motion.  Skin:    General: Skin is warm and dry.     Coloration: Skin is not jaundiced or pale.  Neurological:     General: No focal deficit present.     Mental Status: She is alert.      ED Results / Procedures / Treatments   Labs (all labs ordered are listed, but only abnormal results are displayed) Labs Reviewed - No data to display  EKG None  Radiology No results found.  Procedures .Marland KitchenIncision and Drainage  Date/Time: 01/22/2020 7:27 AM Performed by: Marily Memos, MD Authorized by: Marily Memos, MD   Consent:    Consent obtained:  Verbal   Consent given by:  Patient   Risks, benefits, and alternatives were discussed: yes     Risks discussed:  Bleeding, incomplete drainage, pain, infection and damage to other organs   Alternatives discussed:  No treatment, delayed treatment, alternative treatment, observation and referral Universal protocol:    Procedure explained and questions answered to patient or proxy's satisfaction: yes     Patient identity confirmed:  Verbally with patient and arm band Location:    Type:  Bartholin cyst   Size:  2x2cm   Location:  Anogenital   Anogenital location:  Bartholin's gland Anesthesia:    Anesthesia method:  Local infiltration   Local anesthetic:  Lidocaine 1% WITH epi Procedure type:    Complexity:  Simple Procedure details:    Ultrasound guidance: no     Needle aspiration: no     Incision types:  Single straight   Incision depth:  Submucosal   Wound management:  Irrigated with saline and probed and deloculated   Drainage:  Bloody and purulent   Drainage amount:  Copious   Wound treatment:  Drain placed   Packing materials:  None Post-procedure details:    Procedure completion:  Tolerated   (including critical care time)  Medications Ordered in ED Medications  metroNIDAZOLE (FLAGYL) tablet 500 mg (500 mg Oral Given 01/22/20 0252)  acetaminophen (TYLENOL) tablet 1,000 mg (1,000 mg Oral Given 01/22/20 0252)  oxyCODONE (Oxy IR/ROXICODONE) immediate release tablet 10 mg (10 mg Oral Given 01/22/20 0252)    ED Course  I have reviewed the triage vital signs and the nursing notes.  Pertinent labs &  imaging results that were available during my care of the patient were reviewed by me and considered in my medical decision making (see chart for details).    MDM Rules/Calculators/A&P  Recurrent Bartholin gland cyst this time on the right side.  Word catheter placed as per procedure note above.  Antibiotics restarted.  Discharged to follow-up with her gynecologist.  Final Clinical Impression(s) / ED Diagnoses Final diagnoses:  Bartholin gland cyst    Rx / DC Orders ED Discharge Orders         Ordered    metroNIDAZOLE (FLAGYL) 500 MG tablet  2 times daily        01/22/20 0239    ampicillin (PRINCIPEN) 500 MG capsule  4 times daily        01/22/20 0239           Jaxx Huish, Barbara Cower, MD 01/22/20 [phone]

## 2020-01-23 ENCOUNTER — Telehealth: Payer: Self-pay

## 2020-01-23 NOTE — Telephone Encounter (Signed)
Transition Care Management Follow-up Telephone Call  Date of discharge and from where: 01/22/2020 [facility]  How have you been since you were released from the hospital? Doing fine.   Any questions or concerns? No  Items Reviewed:  Did the pt receive and understand the discharge instructions provided? Yes   Medications obtained and verified? Not able to get yet.   Other? No   Any new allergies since your discharge? No   Dietary orders reviewed? Yes  Do you have support at home? Yes    Functional Questionnaire: (I = Independent and D = Dependent) ADLs: I  Bathing/Dressing- I  Meal Prep- I  Eating- I  Maintaining continence- I  Transferring/Ambulation- I  Managing Meds- I  Follow up appointments reviewed:   PCP Hospital f/u appt confirmed? No Will follow up with PCP after finished with dose of medication prescribed.   Specialist Hospital f/u appt confirmed? No    Are transportation arrangements needed? No   If their condition worsens, is the pt aware to call PCP or go to the Emergency Dept.? Yes  Was the patient provided with contact information for the PCP's office or ED? Yes  Was to pt encouraged to call back with questions or concerns? Yes

## 2020-08-16 ENCOUNTER — Other Ambulatory Visit: Payer: Self-pay

## 2020-08-16 ENCOUNTER — Emergency Department (HOSPITAL_COMMUNITY)
Admission: EM | Admit: 2020-08-16 | Discharge: 2020-08-16 | Disposition: A | Discharge status: Home / Self Care | Payer: Medicaid Other | Attending: Emergency Medicine | Admitting: Emergency Medicine

## 2020-08-16 DIAGNOSIS — R1013 Epigastric pain: Secondary | ICD-10-CM | POA: Insufficient documentation

## 2020-08-16 DIAGNOSIS — N898 Other specified noninflammatory disorders of vagina: Secondary | ICD-10-CM | POA: Insufficient documentation

## 2020-08-16 DIAGNOSIS — R1010 Upper abdominal pain, unspecified: Secondary | ICD-10-CM

## 2020-08-16 DIAGNOSIS — F17210 Nicotine dependence, cigarettes, uncomplicated: Secondary | ICD-10-CM | POA: Diagnosis not present

## 2020-08-16 DIAGNOSIS — Z91040 Latex allergy status: Secondary | ICD-10-CM | POA: Diagnosis not present

## 2020-08-16 DIAGNOSIS — I10 Essential (primary) hypertension: Secondary | ICD-10-CM | POA: Insufficient documentation

## 2020-08-16 DIAGNOSIS — K219 Gastro-esophageal reflux disease without esophagitis: Secondary | ICD-10-CM | POA: Insufficient documentation

## 2020-08-16 DIAGNOSIS — R1011 Right upper quadrant pain: Secondary | ICD-10-CM | POA: Insufficient documentation

## 2020-08-16 LAB — CBC WITH DIFFERENTIAL/PLATELET
Abs Immature Granulocytes: 0.01 10*3/uL (ref 0.00–0.07)
Basophils Absolute: 0 10*3/uL (ref 0.0–0.1)
Basophils Relative: 0 %
Eosinophils Absolute: 0.2 10*3/uL (ref 0.0–0.5)
Eosinophils Relative: 2 %
HCT: 40.7 % (ref 36.0–46.0)
Hemoglobin: 13.2 g/dL (ref 12.0–15.0)
Immature Granulocytes: 0 %
Lymphocytes Relative: 32 %
Lymphs Abs: 2 10*3/uL (ref 0.7–4.0)
MCH: 29.7 pg (ref 26.0–34.0)
MCHC: 32.4 g/dL (ref 30.0–36.0)
MCV: 91.5 fL (ref 80.0–100.0)
Monocytes Absolute: 0.5 10*3/uL (ref 0.1–1.0)
Monocytes Relative: 9 %
Neutro Abs: 3.6 10*3/uL (ref 1.7–7.7)
Neutrophils Relative %: 57 %
Platelets: 266 10*3/uL (ref 150–400)
RBC: 4.45 MIL/uL (ref 3.87–5.11)
RDW: 15.5 % (ref 11.5–15.5)
WBC: 6.3 10*3/uL (ref 4.0–10.5)
nRBC: 0 % (ref 0.0–0.2)

## 2020-08-16 LAB — COMPREHENSIVE METABOLIC PANEL
ALT: 14 U/L (ref 0–44)
AST: 17 U/L (ref 15–41)
Albumin: 3.7 g/dL (ref 3.5–5.0)
Alkaline Phosphatase: 48 U/L (ref 38–126)
Anion gap: 7 (ref 5–15)
BUN: 9 mg/dL (ref 6–20)
CO2: 24 mmol/L (ref 22–32)
Calcium: 8.7 mg/dL — ABNORMAL LOW (ref 8.9–10.3)
Chloride: 108 mmol/L (ref 98–111)
Creatinine, Ser: 1.13 mg/dL — ABNORMAL HIGH (ref 0.44–1.00)
GFR, Estimated: >60 mL/min (ref >60)
Glucose, Bld: 111 mg/dL — ABNORMAL HIGH (ref 70–99)
Potassium: 3.7 mmol/L (ref 3.5–5.1)
Sodium: 139 mmol/L (ref 135–145)
Total Bilirubin: 0.4 mg/dL (ref 0.3–1.2)
Total Protein: 6.9 g/dL (ref 6.5–8.1)

## 2020-08-16 LAB — LIPASE, BLOOD: Lipase: 24 U/L (ref 11–51)

## 2020-08-16 LAB — HCG, QUANTITATIVE, PREGNANCY: hCG, Beta Chain, Quant, S: <1 m[IU]/mL (ref <5)

## 2020-08-16 MED ORDER — HYDROMORPHONE HCL 1 MG/ML IJ SOLN
1.0000 mg | Freq: Once | INTRAMUSCULAR | Status: AC
  Administered 2020-08-16: 1 mg via INTRAVENOUS
  Filled 2020-08-16: qty 1

## 2020-08-16 MED ORDER — HYDROCODONE-ACETAMINOPHEN 5-325 MG PO TABS: 1.0000 | ORAL_TABLET | Freq: Four times a day (QID) | ORAL | 0 refills | Status: DC | PRN

## 2020-08-16 MED ORDER — SODIUM CHLORIDE 0.9 % IV BOLUS
1000.0000 mL | Freq: Once | INTRAVENOUS | Status: AC
  Administered 2020-08-16: 1000 mL via INTRAVENOUS

## 2020-08-16 MED ORDER — ONDANSETRON HCL 4 MG/2ML IJ SOLN
4.0000 mg | Freq: Once | INTRAMUSCULAR | Status: AC
  Administered 2020-08-16: 4 mg via INTRAVENOUS
  Filled 2020-08-16: qty 2

## 2020-08-16 MED ORDER — PANTOPRAZOLE SODIUM 40 MG IV SOLR
40.0000 mg | Freq: Once | INTRAVENOUS | Status: AC
  Administered 2020-08-16: 40 mg via INTRAVENOUS
  Filled 2020-08-16: qty 40

## 2020-08-16 MED ORDER — PANTOPRAZOLE SODIUM 20 MG PO TBEC: 20.0000 mg | DELAYED_RELEASE_TABLET | Freq: Every day | ORAL | 0 refills | Status: DC

## 2020-08-16 MED ORDER — HYDROMORPHONE HCL 1 MG/ML IJ SOLN
0.5000 mg | Freq: Once | INTRAMUSCULAR | Status: AC
  Administered 2020-08-16: 0.5 mg via INTRAVENOUS
  Filled 2020-08-16: qty 1

## 2020-08-16 MED ORDER — CIPROFLOXACIN HCL 500 MG PO TABS: 500.0000 mg | ORAL_TABLET | Freq: Two times a day (BID) | ORAL | 0 refills | Status: DC

## 2020-08-16 NOTE — Discharge Instructions (Addendum)
Call Monday for follow-up appointment with Dr. Henreitta Leber or one of her partners this week.  Return if any problem

## 2020-08-16 NOTE — ED Triage Notes (Signed)
Pt with abd pain with N/V/D x 3 days.  Pt with known gallbladder stones.  Pt has not made plans to have gallbladder removed.

## 2020-08-16 NOTE — ED Provider Notes (Signed)
[facility] EMERGENCY DEPARTMENT Provider Note   CSN: [id number] Arrival date & time: 08/16/20  1900     History Chief Complaint  Patient presents with   Abdominal Pain    Priscilla Castillo is a 30 y.o. female.  Patient has history of gallstones.  She periodically has epigastric discomfort  The history is provided by medical records and the patient. No language interpreter was used.  Abdominal Pain Pain location:  RUQ Pain quality: aching   Pain radiates to:  Does not radiate Pain severity:  Moderate Onset quality:  Sudden Timing:  Constant Progression:  Worsening Chronicity:  Recurrent Context: not alcohol use   Relieved by:  Nothing Worsened by:  Nothing Ineffective treatments:  None tried Associated symptoms: no chest pain, no cough, no diarrhea, no fatigue and no hematuria       Past Medical History:  Diagnosis Date   GERD (gastroesophageal reflux disease)    Hypertension    Pelvic inflammatory disease (PID)     Patient Active Problem List   Diagnosis Date Noted   Encounter for incision and drainage procedure 12/28/2019   Vaginal discharge 12/25/2019   Infected cyst of Bartholin's gland duct 12/25/2019   Trichomonas infection 10/16/2018    Past Surgical History:  Procedure Laterality Date   CESAREAN SECTION     x 2     OB History     Gravida  7   Para  3   Term  2   Preterm  1   AB  4   Living  3      SAB  2   IAB      Ectopic      Multiple  0   Live Births  3           No family history on file.  Social History   Tobacco Use   Smoking status: Every Day    Packs/day: 0.33    Types: Cigarettes   Smokeless tobacco: Never  Vaping Use   Vaping Use: Never used  Substance Use Topics   Alcohol use: Yes    Alcohol/week: 2.0 standard drinks    Types: 2 Glasses of wine per week    Comment: occ.    Drug use: Not Currently    Types: Marijuana    Comment: denies    Home Medications Prior to Admission medications    Medication Sig Start Date End Date Taking? Authorizing Provider  ciprofloxacin (CIPRO) 500 MG tablet Take 1 tablet (500 mg total) by mouth 2 (two) times daily. One po bid x 7 days 08/16/20  Yes Bethann Berkshire, MD  diphenhydrAMINE (BENADRYL) 25 MG tablet Take 1 tablet (25 mg total) by mouth every 6 (six) hours. Patient taking differently: Take 25 mg by mouth as needed. 12/07/18  Yes Vanetta Mulders, MD  HYDROcodone-acetaminophen (NORCO/VICODIN) 5-325 MG tablet Take 1 tablet by mouth every 6 (six) hours as needed. 08/16/20  Yes Bethann Berkshire, MD  pantoprazole (PROTONIX) 20 MG tablet Take 1 tablet (20 mg total) by mouth daily. 08/16/20  Yes Bethann Berkshire, MD  ampicillin (PRINCIPEN) 500 MG capsule Take 1 capsule (500 mg total) by mouth 4 (four) times daily. Patient not taking: Reported on 08/16/2020 01/22/20   Mesner, Barbara Cower, MD    Allergies    Shellfish allergy, Banana, Latex, and Nsaids  Review of Systems   Review of Systems  Constitutional:  Negative for appetite change and fatigue.  HENT:  Negative for congestion, ear discharge and sinus pressure.  Eyes:  Negative for discharge.  Respiratory:  Negative for cough.   Cardiovascular:  Negative for chest pain.  Gastrointestinal:  Positive for abdominal pain. Negative for diarrhea.  Genitourinary:  Negative for frequency and hematuria.  Musculoskeletal:  Negative for back pain.  Skin:  Negative for rash.  Neurological:  Negative for seizures and headaches.  Psychiatric/Behavioral:  Negative for hallucinations.    Physical Exam Updated Vital Signs BP 112/81   Pulse 74   Temp (!) 97.4 F (36.3 C) (Temporal)   Resp 17   Ht 5\' 1"  (1.549 m)   Wt 95.3 kg   LMP 08/10/2020   SpO2 100%   BMI 39.68 kg/m   Physical Exam Vitals and nursing note reviewed.  Constitutional:      Appearance: She is well-developed.  HENT:     Head: Normocephalic.     Nose: Nose normal.  Eyes:     General: No scleral icterus.    Conjunctiva/sclera:  Conjunctivae normal.  Neck:     Thyroid: No thyromegaly.  Cardiovascular:     Rate and Rhythm: Normal rate and regular rhythm.     Heart sounds: No murmur heard.   No friction rub. No gallop.  Pulmonary:     Breath sounds: No stridor. No wheezing or rales.  Chest:     Chest wall: No tenderness.  Abdominal:     General: There is no distension.     Tenderness: There is abdominal tenderness. There is no rebound.  Musculoskeletal:        General: Normal range of motion.     Cervical back: Neck supple.  Lymphadenopathy:     Cervical: No cervical adenopathy.  Skin:    Findings: No erythema or rash.  Neurological:     Mental Status: She is alert and oriented to person, place, and time.     Motor: No abnormal muscle tone.     Coordination: Coordination normal.  Psychiatric:        Behavior: Behavior normal.    ED Results / Procedures / Treatments   Labs (all labs ordered are listed, but only abnormal results are displayed) Labs Reviewed  COMPREHENSIVE METABOLIC PANEL - Abnormal; Notable for the following components:      Result Value   Glucose, Bld 111 (*)    Creatinine, Ser 1.13 (*)    Calcium 8.7 (*)    All other components within normal limits  CBC WITH DIFFERENTIAL/PLATELET  LIPASE, BLOOD  HCG, QUANTITATIVE, PREGNANCY    EKG None  Radiology No results found.  Procedures Procedures   Medications Ordered in ED Medications  HYDROmorphone (DILAUDID) injection 0.5 mg (has no administration in time range)  sodium chloride 0.9 % bolus 1,000 mL (0 mLs Intravenous Stopped 08/16/20 2044)  HYDROmorphone (DILAUDID) injection 1 mg (1 mg Intravenous Given 08/16/20 1944)  ondansetron (ZOFRAN) injection 4 mg (4 mg Intravenous Given 08/16/20 1944)  pantoprazole (PROTONIX) injection 40 mg (40 mg Intravenous Given 08/16/20 1943)    ED Course  I have reviewed the triage vital signs and the nursing notes.  Pertinent labs & imaging results that were available during my care of the  patient were reviewed by me and considered in my medical decision making (see chart for details).    MDM Rules/Calculators/A&P                           Patient with epigastric pain and normal labs.  Suspect gallstones given her problems.  She sent home with pain medicine protonic and Cipro and will follow up with surgery Final Clinical Impression(s) / ED Diagnoses Final diagnoses:  Pain of upper abdomen    Rx / DC Orders ED Discharge Orders          Ordered    pantoprazole (PROTONIX) 20 MG tablet  Daily        08/16/20 2132    ciprofloxacin (CIPRO) 500 MG tablet  2 times daily        08/16/20 2132    HYDROcodone-acetaminophen (NORCO/VICODIN) 5-325 MG tablet  Every 6 hours PRN        08/16/20 2132             Bethann Berkshire, MD 08/18/20 1023

## 2020-08-18 ENCOUNTER — Telehealth: Payer: Self-pay

## 2020-08-18 NOTE — Telephone Encounter (Signed)
Transition Care Management Unsuccessful Follow-up Telephone Call  Date of discharge and from where:  08/16/2020[facility]  Attempts:  1st Attempt  Reason for unsuccessful TCM follow-up call:  Left voice message

## 2020-08-19 NOTE — Telephone Encounter (Signed)
Transition Care Management Unsuccessful Follow-up Telephone Call  Date of discharge and from where:  08/16/2020[facility]   Attempts:  2nd Attempt  Reason for unsuccessful TCM follow-up call:  Voice mail full

## 2020-08-20 NOTE — Telephone Encounter (Signed)
Transition Care Management Unsuccessful Follow-up Telephone Call  Date of discharge and from where:  08/16/2020[facility]   Attempts:  3rd Attempt  Reason for unsuccessful TCM follow-up call:  Voice mail full

## 2020-08-30 ENCOUNTER — Other Ambulatory Visit: Payer: Self-pay

## 2020-08-30 ENCOUNTER — Emergency Department (HOSPITAL_COMMUNITY)
Admission: EM | Admit: 2020-08-30 | Discharge: 2020-08-31 | Disposition: A | Discharge status: Home / Self Care | Payer: Medicaid Other | Attending: Emergency Medicine | Admitting: Emergency Medicine

## 2020-08-30 DIAGNOSIS — N9489 Other specified conditions associated with female genital organs and menstrual cycle: Secondary | ICD-10-CM | POA: Insufficient documentation

## 2020-08-30 DIAGNOSIS — K8020 Calculus of gallbladder without cholecystitis without obstruction: Secondary | ICD-10-CM | POA: Diagnosis not present

## 2020-08-30 DIAGNOSIS — Z91040 Latex allergy status: Secondary | ICD-10-CM | POA: Diagnosis not present

## 2020-08-30 DIAGNOSIS — R109 Unspecified abdominal pain: Secondary | ICD-10-CM | POA: Diagnosis present

## 2020-08-30 DIAGNOSIS — F17210 Nicotine dependence, cigarettes, uncomplicated: Secondary | ICD-10-CM | POA: Insufficient documentation

## 2020-08-30 DIAGNOSIS — R1013 Epigastric pain: Secondary | ICD-10-CM

## 2020-08-31 ENCOUNTER — Encounter (HOSPITAL_COMMUNITY): Payer: Self-pay

## 2020-08-31 ENCOUNTER — Other Ambulatory Visit: Payer: Self-pay

## 2020-08-31 ENCOUNTER — Emergency Department (HOSPITAL_COMMUNITY): Payer: Medicaid Other

## 2020-08-31 LAB — COMPREHENSIVE METABOLIC PANEL
ALT: 14 U/L (ref 0–44)
AST: 16 U/L (ref 15–41)
Albumin: 3.9 g/dL (ref 3.5–5.0)
Alkaline Phosphatase: 50 U/L (ref 38–126)
Anion gap: 6 (ref 5–15)
BUN: 16 mg/dL (ref 6–20)
CO2: 24 mmol/L (ref 22–32)
Calcium: 8.6 mg/dL — ABNORMAL LOW (ref 8.9–10.3)
Chloride: 106 mmol/L (ref 98–111)
Creatinine, Ser: 1.08 mg/dL — ABNORMAL HIGH (ref 0.44–1.00)
GFR, Estimated: >60 mL/min (ref >60)
Glucose, Bld: 90 mg/dL (ref 70–99)
Potassium: 4.2 mmol/L (ref 3.5–5.1)
Sodium: 136 mmol/L (ref 135–145)
Total Bilirubin: 0.7 mg/dL (ref 0.3–1.2)
Total Protein: 7.2 g/dL (ref 6.5–8.1)

## 2020-08-31 LAB — CBC WITH DIFFERENTIAL/PLATELET
Abs Immature Granulocytes: 0.03 10*3/uL (ref 0.00–0.07)
Basophils Absolute: 0 10*3/uL (ref 0.0–0.1)
Basophils Relative: 0 %
Eosinophils Absolute: 0.2 10*3/uL (ref 0.0–0.5)
Eosinophils Relative: 2 %
HCT: 41.5 % (ref 36.0–46.0)
Hemoglobin: 13.4 g/dL (ref 12.0–15.0)
Immature Granulocytes: 0 %
Lymphocytes Relative: 36 %
Lymphs Abs: 3.4 10*3/uL (ref 0.7–4.0)
MCH: 29.5 pg (ref 26.0–34.0)
MCHC: 32.3 g/dL (ref 30.0–36.0)
MCV: 91.4 fL (ref 80.0–100.0)
Monocytes Absolute: 0.9 10*3/uL (ref 0.1–1.0)
Monocytes Relative: 9 %
Neutro Abs: 5 10*3/uL (ref 1.7–7.7)
Neutrophils Relative %: 53 %
Platelets: 242 10*3/uL (ref 150–400)
RBC: 4.54 MIL/uL (ref 3.87–5.11)
RDW: 15.1 % (ref 11.5–15.5)
WBC: 9.6 10*3/uL (ref 4.0–10.5)
nRBC: 0 % (ref 0.0–0.2)

## 2020-08-31 LAB — LIPASE, BLOOD: Lipase: 27 U/L (ref 11–51)

## 2020-08-31 LAB — I-STAT BETA HCG BLOOD, ED (MC, WL, AP ONLY): I-stat hCG, quantitative: <5 m[IU]/mL (ref <5)

## 2020-08-31 MED ORDER — ALUM & MAG HYDROXIDE-SIMETH 200-200-20 MG/5ML PO SUSP
30.0000 mL | Freq: Once | ORAL | Status: AC
  Administered 2020-08-31: 30 mL via ORAL
  Filled 2020-08-31: qty 30

## 2020-08-31 MED ORDER — FENTANYL CITRATE PF 50 MCG/ML IJ SOSY
50.0000 ug | PREFILLED_SYRINGE | Freq: Once | INTRAMUSCULAR | Status: AC
  Administered 2020-08-31: 50 ug via INTRAVENOUS
  Filled 2020-08-31: qty 1

## 2020-08-31 MED ORDER — PANTOPRAZOLE SODIUM 40 MG IV SOLR
40.0000 mg | Freq: Once | INTRAVENOUS | Status: AC
  Administered 2020-08-31: 40 mg via INTRAVENOUS
  Filled 2020-08-31: qty 40

## 2020-08-31 MED ORDER — HYDROMORPHONE HCL 1 MG/ML IJ SOLN
1.0000 mg | Freq: Once | INTRAMUSCULAR | Status: AC
  Administered 2020-08-31: 1 mg via INTRAVENOUS

## 2020-08-31 MED ORDER — SUCRALFATE 1 G PO TABS: 1.0000 g | ORAL_TABLET | Freq: Three times a day (TID) | ORAL | 0 refills | Status: DC

## 2020-08-31 MED ORDER — IOHEXOL 300 MG/ML  SOLN
100.0000 mL | Freq: Once | INTRAMUSCULAR | Status: AC | PRN
  Administered 2020-08-31: 100 mL via INTRAVENOUS

## 2020-08-31 MED ORDER — SODIUM CHLORIDE 0.9 % IV BOLUS
1000.0000 mL | Freq: Once | INTRAVENOUS | Status: AC
  Administered 2020-08-31: 1000 mL via INTRAVENOUS

## 2020-08-31 MED ORDER — LIDOCAINE VISCOUS HCL 2 % MT SOLN
15.0000 mL | Freq: Once | OROMUCOSAL | Status: AC
  Administered 2020-08-31: 15 mL via ORAL
  Filled 2020-08-31: qty 15

## 2020-08-31 MED ORDER — ONDANSETRON HCL 4 MG/2ML IJ SOLN
4.0000 mg | Freq: Once | INTRAMUSCULAR | Status: AC
  Administered 2020-08-31: 4 mg via INTRAVENOUS
  Filled 2020-08-31: qty 2

## 2020-08-31 MED ORDER — ONDANSETRON 4 MG PO TBDP: 4.0000 mg | ORAL_TABLET | Freq: Three times a day (TID) | ORAL | 0 refills | Status: DC | PRN

## 2020-08-31 MED ORDER — HYDROMORPHONE HCL 1 MG/ML IJ SOLN
INTRAMUSCULAR | Status: AC
  Filled 2020-08-31: qty 1

## 2020-08-31 NOTE — ED Notes (Signed)
Pt tolerated fluid challenge. 

## 2020-08-31 NOTE — ED Triage Notes (Signed)
Pt arrived via POV. Pt states that she has first appt with surgeon to discuss gallbladder removal on 09/04/2020. Pt decided to come today because of abd pain which is a 10/10

## 2020-08-31 NOTE — Discharge Instructions (Addendum)
Take your Protonix as prescribed.  Avoid alcohol, caffeine, NSAID medications, spicy foods.  Follow-up as scheduled with Dr. Lovell Sheehan.  Call to schedule ultrasound for further assessment of her gallbladder prior to her appointment with him.  Return to the ED with worsening pain, fever, vomiting, or other concerns

## 2020-08-31 NOTE — ED Provider Notes (Signed)
[facility] EMERGENCY DEPARTMENT Provider Note   CSN: [id number] Arrival date & time: 08/30/20  2312     History Chief Complaint  Patient presents with   Abdominal Pain    Priscilla Castillo is a 30 y.o. female.  Patient with a history of GERD and endometriosis here with what she describes as a gallbladder attack.  States she has known gallstones and has been intermittent upper abdominal pain for several months.  Pain is normally improved with narcotics at home which she is now out of.  States she has an appointment with her surgeon on August 11.  She describes pain to her upper abdomen and right upper quadrant.  States she said nausea vomiting today x4 5.  She gets attacks on a regular basis but is becoming more frequent.  This feels similar to previous episodes of gallbladder attacks.  No fever.  No lower abdominal pain.  No pain with urination or blood in the urine.  No vaginal bleeding or vaginal discharge.  No chest pain or shortness of breath. Has not yet seen a surgeon about her gallbladder.  States she had a CT scan about a year ago.  The history is provided by the patient.  Abdominal Pain Associated symptoms: nausea and vomiting   Associated symptoms: no chest pain, no cough, no dysuria, no fever, no hematuria and no shortness of breath       Past Medical History:  Diagnosis Date   GERD (gastroesophageal reflux disease)    Hypertension    Pelvic inflammatory disease (PID)     Patient Active Problem List   Diagnosis Date Noted   Encounter for incision and drainage procedure 12/28/2019   Vaginal discharge 12/25/2019   Infected cyst of Bartholin's gland duct 12/25/2019   Trichomonas infection 10/16/2018    Past Surgical History:  Procedure Laterality Date   CESAREAN SECTION     x 2     OB History     Gravida  7   Para  3   Term  2   Preterm  1   AB  4   Living  3      SAB  2   IAB      Ectopic      Multiple  0   Live Births  3            History reviewed. No pertinent family history.  Social History   Tobacco Use   Smoking status: Every Day    Packs/day: 0.33    Types: Cigarettes   Smokeless tobacco: Never  Vaping Use   Vaping Use: Never used  Substance Use Topics   Alcohol use: Yes    Alcohol/week: 2.0 standard drinks    Types: 2 Glasses of wine per week    Comment: occ.    Drug use: Not Currently    Types: Marijuana    Comment: denies    Home Medications Prior to Admission medications   Medication Sig Start Date End Date Taking? Authorizing Provider  ampicillin (PRINCIPEN) 500 MG capsule Take 1 capsule (500 mg total) by mouth 4 (four) times daily. Patient not taking: Reported on 08/16/2020 01/22/20   Mesner, Barbara Cower, MD  ciprofloxacin (CIPRO) 500 MG tablet Take 1 tablet (500 mg total) by mouth 2 (two) times daily. One po bid x 7 days 08/16/20   Bethann Berkshire, MD  diphenhydrAMINE (BENADRYL) 25 MG tablet Take 1 tablet (25 mg total) by mouth every 6 (six) hours. Patient taking differently: Take  25 mg by mouth as needed. 12/07/18   Vanetta Mulders, MD  HYDROcodone-acetaminophen (NORCO/VICODIN) 5-325 MG tablet Take 1 tablet by mouth every 6 (six) hours as needed. 08/16/20   Bethann Berkshire, MD  pantoprazole (PROTONIX) 20 MG tablet Take 1 tablet (20 mg total) by mouth daily. 08/16/20   Bethann Berkshire, MD    Allergies    Shellfish allergy, Banana, Latex, and Nsaids  Review of Systems   Review of Systems  Constitutional:  Positive for activity change and appetite change. Negative for fever.  HENT:  Negative for congestion and rhinorrhea.   Respiratory:  Negative for cough, chest tightness and shortness of breath.   Cardiovascular:  Negative for chest pain and leg swelling.  Gastrointestinal:  Positive for abdominal pain, nausea and vomiting.  Genitourinary:  Negative for dysuria and hematuria.  Musculoskeletal:  Negative for back pain and myalgias.  Skin:  Negative for rash.  Neurological:  Negative for  dizziness, weakness and headaches.   all other systems are negative except as noted in the HPI and PMH.   Physical Exam Updated Vital Signs BP 130/82 (BP Location: Right Arm)   Pulse 73   Temp 97.6 F (36.4 C) (Oral)   Resp 16   Ht 5\' 1"  (1.549 m)   Wt 95.7 kg   LMP 08/10/2020   SpO2 100%   BMI 39.87 kg/m   Physical Exam Vitals and nursing note reviewed.  Constitutional:      General: She is not in acute distress.    Appearance: She is well-developed.  HENT:     Head: Normocephalic and atraumatic.     Mouth/Throat:     Pharynx: No oropharyngeal exudate.  Eyes:     Conjunctiva/sclera: Conjunctivae normal.     Pupils: Pupils are equal, round, and reactive to light.  Neck:     Comments: No meningismus. Cardiovascular:     Rate and Rhythm: Normal rate and regular rhythm.     Heart sounds: Normal heart sounds. No murmur heard. Pulmonary:     Effort: Pulmonary effort is normal. No respiratory distress.     Breath sounds: Normal breath sounds.  Abdominal:     Palpations: Abdomen is soft.     Tenderness: There is abdominal tenderness. There is guarding. There is no rebound.     Comments: Epigastric and right upper quadrant tenderness, no guarding No lower abdominal tenderness.  Musculoskeletal:        General: No tenderness. Normal range of motion.     Cervical back: Normal range of motion and neck supple.     Comments: No CVAT  Skin:    General: Skin is warm.  Neurological:     Mental Status: She is alert and oriented to person, place, and time.     Cranial Nerves: No cranial nerve deficit.     Motor: No abnormal muscle tone.     Coordination: Coordination normal.     Comments:  5/5 strength throughout. CN 2-12 intact.Equal grip strength.   Psychiatric:        Behavior: Behavior normal.    ED Results / Procedures / Treatments   Labs (all labs ordered are listed, but only abnormal results are displayed) Labs Reviewed  COMPREHENSIVE METABOLIC PANEL - Abnormal;  Notable for the following components:      Result Value   Creatinine, Ser 1.08 (*)    Calcium 8.6 (*)    All other components within normal limits  CBC WITH DIFFERENTIAL/PLATELET  LIPASE, BLOOD  URINALYSIS, ROUTINE  W REFLEX MICROSCOPIC  I-STAT BETA HCG BLOOD, ED ([facility], [facility], [facility] ONLY)    EKG None  Radiology CT ABDOMEN PELVIS W CONTRAST  Result Date: 08/31/2020 CLINICAL DATA:  Abdominal pain with nausea vomiting and diarrhea intermittently for 1 year EXAM: CT ABDOMEN AND PELVIS WITH CONTRAST TECHNIQUE: Multidetector CT imaging of the abdomen and pelvis was performed using the standard protocol following bolus administration of intravenous contrast. CONTRAST:  OMNIPAQUE IOHEXOL 300 MG/ML  SOLN COMPARISON:  08/04/2019 FINDINGS: Lower chest: No acute abnormality. Hepatobiliary: Gallbladder is decompressed but demonstrates multiple stones within. The liver is well visualized and within normal limits. No biliary ductal dilatation is seen. Pancreas: Unremarkable. No pancreatic ductal dilatation or surrounding inflammatory changes. Spleen: Normal in size without focal abnormality. Adrenals/Urinary Tract: Adrenal glands are within normal limits. Kidneys demonstrate a normal enhancement pattern bilaterally. No renal calculi or obstructive changes are seen. The bladder is partially distended. Stomach/Bowel: The appendix is within normal limits. Colon shows no obstructive or inflammatory changes. Stomach and small bowel are unremarkable. Vascular/Lymphatic: No significant vascular findings are present. No enlarged abdominal or pelvic lymph nodes. Reproductive: Uterus is well visualized and within normal limits. Involuting cyst is noted within the left adnexa. This measures approximately 14 mm. Other: No abdominal wall hernia or abnormality. No abdominopelvic ascites. Musculoskeletal: No acute or significant osseous findings. IMPRESSION: Cholelithiasis without complicating factors. Normal appearing appendix.  Involuting cyst in the left adnexa. Electronically Signed   By: Alcide Clever M.D.   On: 08/31/2020 02:51    Procedures Procedures   Medications Ordered in ED Medications  sodium chloride 0.9 % bolus 1,000 mL (has no administration in time range)  ondansetron (ZOFRAN) injection 4 mg (has no administration in time range)  fentaNYL (SUBLIMAZE) injection 50 mcg (has no administration in time range)    ED Course  I have reviewed the triage vital signs and the nursing notes.  Pertinent labs & imaging results that were available during my care of the patient were reviewed by me and considered in my medical decision making (see chart for details).    MDM Rules/Calculators/A&P                          Upper abdominal pain with nausea and vomiting similar to previous gallbladder issues.  Abdomen soft without peritoneal signs  Work-up reassuring with normal LFTs and lipase.  No leukocytosis.  No fever.  CT scan shows cholelithiasis without complicating features.  Continue PPI, avoid alcohol, caffeine, NSAIDs, spicy foods.  May have some component of gastritis as well. Add carafate.  She has a surgical follow-up on August 11 for gallstones.  We will obtain outpatient ultrasound for surgical planning purposes.  Follow-up with general surgery and PCP.  Return precautions discussed Final Clinical Impression(s) / ED Diagnoses Final diagnoses:  Epigastric pain  Gallstones    Rx / DC Orders ED Discharge Orders     None        Miko Sirico, Jeannett Senior, MD 08/31/20 [phone]

## 2020-09-01 ENCOUNTER — Telehealth: Payer: Self-pay

## 2020-09-01 NOTE — Telephone Encounter (Signed)
Transition Care Management Unsuccessful Follow-up Telephone Call  Date of discharge and from where:  08/31/2020-[facility]  Attempts:  1st Attempt  Reason for unsuccessful TCM follow-up call:  Voice mail full

## 2020-09-02 NOTE — Telephone Encounter (Signed)
Transition Care Management Unsuccessful Follow-up Telephone Call  Date of discharge and from where:  08/31/2020-[facility] ED   Attempts:  2nd Attempt  Reason for unsuccessful TCM follow-up call:  Left voice message

## 2020-09-03 NOTE — Telephone Encounter (Signed)
Transition Care Management Unsuccessful Follow-up Telephone Call  Date of discharge and from where:  08/31/2020 from [place]  Attempts:  3rd Attempt  Reason for unsuccessful TCM follow-up call:  Unable to reach patient    

## 2020-09-04 ENCOUNTER — Encounter: Payer: Self-pay | Admitting: General Surgery

## 2020-09-04 ENCOUNTER — Ambulatory Visit: Payer: Medicaid Other | Admitting: General Surgery

## 2020-09-04 ENCOUNTER — Other Ambulatory Visit: Payer: Self-pay

## 2020-09-04 VITALS — BP 116/80 | HR 92 | Temp 99.0°F | Resp 16 | Ht 61.0 in | Wt 210.2 lb

## 2020-09-04 DIAGNOSIS — K8020 Calculus of gallbladder without cholecystitis without obstruction: Secondary | ICD-10-CM

## 2020-09-05 NOTE — Progress Notes (Signed)
Priscilla Castillo; [id number]; Jun 19, 1990   HPI Patient is a 30 year old black female who was referred to my care by Dr. Leandrew Koyanagi for evaluation treatment of recurrent biliary colic.  This has been occurring over many months.  She was nervous about any surgical intervention.  She has had episodes of right upper quadrant abdominal pain, nausea, and vomiting requiring ER visits.  CT scan of the abdomen done on 08/31/2020 reveals cholelithiasis.  Patient denies any fever, chills, jaundice.  The attacks are occurring more frequently in nature. Past Medical History:  Diagnosis Date   GERD (gastroesophageal reflux disease)    Hypertension    Pelvic inflammatory disease (PID)     Past Surgical History:  Procedure Laterality Date   CESAREAN SECTION     x 2    History reviewed. No pertinent family history.  Current Outpatient Medications on File Prior to Visit  Medication Sig Dispense Refill   diphenhydrAMINE (BENADRYL) 25 MG tablet Take 1 tablet (25 mg total) by mouth every 6 (six) hours. (Patient taking differently: Take 25 mg by mouth as needed.) 20 tablet 0   ondansetron (ZOFRAN ODT) 4 MG disintegrating tablet Take 1 tablet (4 mg total) by mouth every 8 (eight) hours as needed for nausea or vomiting. 20 tablet 0   pantoprazole (PROTONIX) 20 MG tablet Take 1 tablet (20 mg total) by mouth daily. 30 tablet 0   HYDROcodone-acetaminophen (NORCO/VICODIN) 5-325 MG tablet Take 1 tablet by mouth every 6 (six) hours as needed. (Patient not taking: Reported on 09/04/2020) 20 tablet 0   No current facility-administered medications on file prior to visit.    Allergies  Allergen Reactions   Shellfish Allergy Anaphylaxis   Banana Hives and Swelling   Latex Other (See Comments)    itching   Nsaids Hives    Social History   Substance and Sexual Activity  Alcohol Use Yes   Alcohol/week: 2.0 standard drinks   Types: 2 Glasses of wine per week   Comment: occ.     Social History   Tobacco Use   Smoking Status Every Day   Packs/day: 0.33   Types: Cigarettes  Smokeless Tobacco Never    Review of Systems  Constitutional:  Positive for malaise/fatigue.  HENT: Negative.    Eyes: Negative.   Respiratory: Negative.    Cardiovascular: Negative.   Gastrointestinal:  Positive for abdominal pain, heartburn and nausea.  Musculoskeletal:  Positive for neck pain.  Skin: Negative.   Neurological: Negative.   Endo/Heme/Allergies: Negative.   Psychiatric/Behavioral: Negative.     Objective   Vitals:   09/04/20 1130  BP: 116/80  Pulse: 92  Resp: 16  Temp: 99 F (37.2 C)  SpO2: 99%    Physical Exam Vitals reviewed.  Constitutional:      Appearance: Normal appearance. She is not ill-appearing.  HENT:     Head: Normocephalic and atraumatic.  Eyes:     General: No scleral icterus. Cardiovascular:     Rate and Rhythm: Normal rate and regular rhythm.     Heart sounds: Normal heart sounds. No murmur heard.   No friction rub. No gallop.  Pulmonary:     Effort: Pulmonary effort is normal. No respiratory distress.     Breath sounds: Normal breath sounds. No stridor. No wheezing, rhonchi or rales.  Abdominal:     General: Bowel sounds are normal. There is no distension.     Palpations: Abdomen is soft. There is no mass.     Tenderness: There is abdominal  tenderness. There is no guarding or rebound.     Hernia: No hernia is present.     Comments: Some discomfort to deep palpation in the right upper quadrant.  This reproduces her discomfort.  Skin:    General: Skin is warm and dry.  Neurological:     Mental Status: She is alert and oriented to person, place, and time.  ER notes reviewed  Assessment  Biliary colic, cholelithiasis Plan  Patient is scheduled for laparoscopic cholecystectomy on 10/03/2020.  The risks and benefits of the procedure including bleeding, infection, hepatobiliary injury, and the possibility of an open procedure were fully explained to the patient, who  gave informed consent.

## 2020-09-05 NOTE — H&P (Signed)
Priscilla Castillo; [id number]; Jun 19, 1990   HPI Patient is a 30 year old black female who was referred to my care by Dr. Leandrew Koyanagi for evaluation treatment of recurrent biliary colic.  This has been occurring over many months.  She was nervous about any surgical intervention.  She has had episodes of right upper quadrant abdominal pain, nausea, and vomiting requiring ER visits.  CT scan of the abdomen done on 08/31/2020 reveals cholelithiasis.  Patient denies any fever, chills, jaundice.  The attacks are occurring more frequently in nature. Past Medical History:  Diagnosis Date   GERD (gastroesophageal reflux disease)    Hypertension    Pelvic inflammatory disease (PID)     Past Surgical History:  Procedure Laterality Date   CESAREAN SECTION     x 2    History reviewed. No pertinent family history.  Current Outpatient Medications on File Prior to Visit  Medication Sig Dispense Refill   diphenhydrAMINE (BENADRYL) 25 MG tablet Take 1 tablet (25 mg total) by mouth every 6 (six) hours. (Patient taking differently: Take 25 mg by mouth as needed.) 20 tablet 0   ondansetron (ZOFRAN ODT) 4 MG disintegrating tablet Take 1 tablet (4 mg total) by mouth every 8 (eight) hours as needed for nausea or vomiting. 20 tablet 0   pantoprazole (PROTONIX) 20 MG tablet Take 1 tablet (20 mg total) by mouth daily. 30 tablet 0   HYDROcodone-acetaminophen (NORCO/VICODIN) 5-325 MG tablet Take 1 tablet by mouth every 6 (six) hours as needed. (Patient not taking: Reported on 09/04/2020) 20 tablet 0   No current facility-administered medications on file prior to visit.    Allergies  Allergen Reactions   Shellfish Allergy Anaphylaxis   Banana Hives and Swelling   Latex Other (See Comments)    itching   Nsaids Hives    Social History   Substance and Sexual Activity  Alcohol Use Yes   Alcohol/week: 2.0 standard drinks   Types: 2 Glasses of wine per week   Comment: occ.     Social History   Tobacco Use   Smoking Status Every Day   Packs/day: 0.33   Types: Cigarettes  Smokeless Tobacco Never    Review of Systems  Constitutional:  Positive for malaise/fatigue.  HENT: Negative.    Eyes: Negative.   Respiratory: Negative.    Cardiovascular: Negative.   Gastrointestinal:  Positive for abdominal pain, heartburn and nausea.  Musculoskeletal:  Positive for neck pain.  Skin: Negative.   Neurological: Negative.   Endo/Heme/Allergies: Negative.   Psychiatric/Behavioral: Negative.     Objective   Vitals:   09/04/20 1130  BP: 116/80  Pulse: 92  Resp: 16  Temp: 99 F (37.2 C)  SpO2: 99%    Physical Exam Vitals reviewed.  Constitutional:      Appearance: Normal appearance. She is not ill-appearing.  HENT:     Head: Normocephalic and atraumatic.  Eyes:     General: No scleral icterus. Cardiovascular:     Rate and Rhythm: Normal rate and regular rhythm.     Heart sounds: Normal heart sounds. No murmur heard.   No friction rub. No gallop.  Pulmonary:     Effort: Pulmonary effort is normal. No respiratory distress.     Breath sounds: Normal breath sounds. No stridor. No wheezing, rhonchi or rales.  Abdominal:     General: Bowel sounds are normal. There is no distension.     Palpations: Abdomen is soft. There is no mass.     Tenderness: There is abdominal  tenderness. There is no guarding or rebound.     Hernia: No hernia is present.     Comments: Some discomfort to deep palpation in the right upper quadrant.  This reproduces her discomfort.  Skin:    General: Skin is warm and dry.  Neurological:     Mental Status: She is alert and oriented to person, place, and time.  ER notes reviewed  Assessment  Biliary colic, cholelithiasis Plan  Patient is scheduled for laparoscopic cholecystectomy on 10/03/2020.  The risks and benefits of the procedure including bleeding, infection, hepatobiliary injury, and the possibility of an open procedure were fully explained to the patient, who  gave informed consent. 

## 2020-09-26 NOTE — Patient Instructions (Signed)
Priscilla LemonsMarquise D Castillo  09/26/2020     @[email or web address] @   Your procedure is scheduled on  10/03/2020.   [facility] at  [phone]  A.M.   Call this number if you have problems the morning of surgery:  [phone]   Remember:  Do not eat or drink after midnight.      Take these medicines the morning of surgery with A SIP OF WATER                                             None     Do not wear jewelry, make-up or nail polish.  Do not wear lotions, powders, or perfumes, or deodorant.  Do not shave 48 hours prior to surgery.  Men may shave face and neck.  Do not bring valuables to the hospital.  [facility] is not responsible for any belongings or valuables.  Contacts, dentures or bridgework may not be worn into surgery.  Leave your suitcase in the car.  After surgery it may be brought to your room.  For patients admitted to the hospital, discharge time will be determined by your treatment team.  Patients discharged the day of surgery will not be allowed to drive home and must have someone with them for 24 hours.   Special instructions:   DO NOT smoke tobacco or vape for 24 hours before your procedure.  Please read over the following fact sheets that you were given. Coughing and Deep Breathing, Surgical Site Infection Prevention, Anesthesia Post-op Instructions, and Care and Recovery After Surgery      Minimally Invasive Cholecystectomy, Care After This sheet gives you information about how to care for yourself after your procedure. Your health care provider may also give you more specific instructions. If you have problems or questions, contact your health care provider. What can I expect after the procedure? After the procedure, it is common to have: Pain at your incision sites. You will be given medicines to control this pain. Mild nausea or vomiting. Bloating and possible shoulder pain from the gas that was used during the procedure. Follow these  instructions at home: Medicines Take over-the-counter and prescription medicines only as told by your health care provider. If you were prescribed an antibiotic medicine, take or use it as told by your health care provider. Do not stop using the antibiotic even if you start to feel better. Ask your health care provider if the medicine prescribed to you: Requires you to avoid driving or using machinery. Can cause constipation. You may need to take these actions to prevent or treat constipation: Drink enough fluid to keep your urine pale yellow. Take over-the-counter or prescription medicines. Eat foods that are high in fiber, such as beans, whole grains, and fresh fruits and vegetables. Limit foods that are high in fat and processed sugars, such as fried or sweet foods. Incision care  Follow instructions from your health care provider about how to take care of your incisions. Make sure you: Wash your hands with soap and water for at least 20 seconds before and after you change your bandage (dressing). If soap and water are not available, use hand sanitizer. Change your dressing as told by your health care provider. Leave stitches (sutures), skin glue, or adhesive strips in place. These skin closures may need to be  in place for 2 weeks or longer. If adhesive strip edges start to loosen and curl up, you may trim the loose edges. Do not remove adhesive strips completely unless your health care provider tells you to do that. Do not take baths, swim, or use a hot tub until your health care provider approves. Ask your health care provider if you may take showers. You may only be allowed to take sponge baths. Check your incision area every day for signs of infection. Check for: More redness, swelling, or pain. Fluid or blood. Warmth. Pus or a bad smell. Activity Rest as told by your health care provider. Avoid sitting for a long time without moving. Get up to take short walks every 1-2 hours. This  is important to improve blood flow and breathing. Ask for help if you feel weak or unsteady. Do not lift anything that is heavier than 10 lb (4.5 kg), or the limit that you are told, until your health care provider says that it is safe. Do not play contact sports until your health care provider approves. Do not return to work or school until your health care provider approves. Return to your normal activities as told by your health care provider. Ask your health care provider what activities are safe for you. General instructions If you were given a sedative during the procedure, it can affect you for several hours. Do not drive or operate machinery until your health care provider says that it is safe. Keep all follow-up visits as told by your health care provider. This is important. Contact a health care provider if: You develop a rash. You have more redness, swelling, or pain around your incisions. You have fluid or blood coming from your incisions. Your incisions feel warm to the touch. You have pus or a bad smell coming from your incisions. You have a fever. One or more of your incisions breaks open. Get help right away if: You have trouble breathing. You have chest pain. You have increasing pain in your shoulders. You faint or feel dizzy when you stand. You have severe pain in your abdomen. You have nausea or vomiting that lasts for more than one day. You have leg pain. Summary After your procedure, it is common to have pain at the incision sites. You may also have nausea or bloating. Follow your health care provider's instructions about medicine, activity restrictions, and caring for your incision areas. Do not do activities that require a lot of effort. Contact a health care provider if you have a fever or other signs of infection, such as more redness, swelling, or pain around the incisions. Get help right away if you have chest pain, increasing pain in the shoulders, or trouble  breathing. This information is not intended to replace advice given to you by your health care provider. Make sure you discuss any questions you have with your health care provider. Document Revised: 10/11/2018 Document Reviewed: 10/11/2018 Elsevier Patient Education  2022 [employer] General Anesthesia, Adult, Care After This sheet gives you information about how to care for yourself after your procedure. Your health care provider may also give you more specific instructions. If you have problems or questions, contact your health care provider. What can I expect after the procedure? After the procedure, the following side effects are common: Pain or discomfort at the IV site. Nausea. Vomiting. Sore throat. Trouble concentrating. Feeling cold or chills. Feeling weak or tired. Sleepiness and fatigue. Soreness and body aches. These side effects can  affect parts of the body that were not involved in surgery. Follow these instructions at home: For the time period you were told by your health care provider:  Rest. Do not participate in activities where you could fall or become injured. Do not drive or use machinery. Do not drink alcohol. Do not take sleeping pills or medicines that cause drowsiness. Do not make important decisions or sign legal documents. Do not take care of children on your own. Eating and drinking Follow any instructions from your health care provider about eating or drinking restrictions. When you feel hungry, start by eating small amounts of foods that are soft and easy to digest (bland), such as toast. Gradually return to your regular diet. Drink enough fluid to keep your urine pale yellow. If you vomit, rehydrate by drinking water, juice, or clear broth. General instructions If you have sleep apnea, surgery and certain medicines can increase your risk for breathing problems. Follow instructions from your health care provider about wearing your sleep  device: Anytime you are sleeping, including during daytime naps. While taking prescription pain medicines, sleeping medicines, or medicines that make you drowsy. Have a responsible adult stay with you for the time you are told. It is important to have someone help care for you until you are awake and alert. Return to your normal activities as told by your health care provider. Ask your health care provider what activities are safe for you. Take over-the-counter and prescription medicines only as told by your health care provider. If you smoke, do not smoke without supervision. Keep all follow-up visits as told by your health care provider. This is important. Contact a health care provider if: You have nausea or vomiting that does not get better with medicine. You cannot eat or drink without vomiting. You have pain that does not get better with medicine. You are unable to pass urine. You develop a skin rash. You have a fever. You have redness around your IV site that gets worse. Get help right away if: You have difficulty breathing. You have chest pain. You have blood in your urine or stool, or you vomit blood. Summary After the procedure, it is common to have a sore throat or nausea. It is also common to feel tired. Have a responsible adult stay with you for the time you are told. It is important to have someone help care for you until you are awake and alert. When you feel hungry, start by eating small amounts of foods that are soft and easy to digest (bland), such as toast. Gradually return to your regular diet. Drink enough fluid to keep your urine pale yellow. Return to your normal activities as told by your health care provider. Ask your health care provider what activities are safe for you. This information is not intended to replace advice given to you by your health care provider. Make sure you discuss any questions you have with your health care provider. Document Revised:  09/27/2019 Document Reviewed: 04/26/2019 Elsevier Patient Education  2022 [employer] How to Use Chlorhexidine for Bathing Chlorhexidine gluconate (CHG) is a germ-killing (antiseptic) solution that is used to clean the skin. It can get rid of the bacteria that normally live on the skin and can keep them away for about 24 hours. To clean your skin with CHG, you may be given: A CHG solution to use in the shower or as part of a sponge bath. A prepackaged cloth that contains CHG. Cleaning your skin with CHG  may help lower the risk for infection: While you are staying in the intensive care unit of the hospital. If you have a vascular access, such as a central line, to provide short-term or long-term access to your veins. If you have a catheter to drain urine from your bladder. If you are on a ventilator. A ventilator is a machine that helps you breathe by moving air in and out of your lungs. After surgery. What are the risks? Risks of using CHG include: A skin reaction. Hearing loss, if CHG gets in your ears and you have a perforated eardrum. Eye injury, if CHG gets in your eyes and is not rinsed out. The CHG product catching fire. Make sure that you avoid smoking and flames after applying CHG to your skin. Do not use CHG: If you have a chlorhexidine allergy or have previously reacted to chlorhexidine. On babies younger than 76 months of age. How to use CHG solution Use CHG only as told by your health care provider, and follow the instructions on the label. Use the full amount of CHG as directed. Usually, this is one bottle. During a shower Follow these steps when using CHG solution during a shower (unless your health care provider gives you different instructions): Start the shower. Use your normal soap and shampoo to wash your face and hair. Turn off the shower or move out of the shower stream. Pour the CHG onto a clean washcloth. Do not use any type of brush or rough-edged  sponge. Starting at your neck, lather your body down to your toes. Make sure you follow these instructions: If you will be having surgery, pay special attention to the part of your body where you will be having surgery. Scrub this area for at least 1 minute. Do not use CHG on your head or face. If the solution gets into your ears or eyes, rinse them well with water. Avoid your genital area. Avoid any areas of skin that have broken skin, cuts, or scrapes. Scrub your back and under your arms. Make sure to wash skin folds. Let the lather sit on your skin for 1-2 minutes or as long as told by your health care provider. Thoroughly rinse your entire body in the shower. Make sure that all body creases and crevices are rinsed well. Dry off with a clean towel. Do not put any substances on your body afterward--such as powder, lotion, or perfume--unless you are told to do so by your health care provider. Only use lotions that are recommended by the manufacturer. Put on clean clothes or pajamas. If it is the night before your surgery, sleep in clean sheets.  During a sponge bath Follow these steps when using CHG solution during a sponge bath (unless your health care provider gives you different instructions): Use your normal soap and shampoo to wash your face and hair. Pour the CHG onto a clean washcloth. Starting at your neck, lather your body down to your toes. Make sure you follow these instructions: If you will be having surgery, pay special attention to the part of your body where you will be having surgery. Scrub this area for at least 1 minute. Do not use CHG on your head or face. If the solution gets into your ears or eyes, rinse them well with water. Avoid your genital area. Avoid any areas of skin that have broken skin, cuts, or scrapes. Scrub your back and under your arms. Make sure to wash skin folds. Let the lather sit  on your skin for 1-2 minutes or as long as told by your health care  provider. Using a different clean, wet washcloth, thoroughly rinse your entire body. Make sure that all body creases and crevices are rinsed well. Dry off with a clean towel. Do not put any substances on your body afterward--such as powder, lotion, or perfume--unless you are told to do so by your health care provider. Only use lotions that are recommended by the manufacturer. Put on clean clothes or pajamas. If it is the night before your surgery, sleep in clean sheets. How to use CHG prepackaged cloths Only use CHG cloths as told by your health care provider, and follow the instructions on the label. Use the CHG cloth on clean, dry skin. Do not use the CHG cloth on your head or face unless your health care provider tells you to. When washing with the CHG cloth: Avoid your genital area. Avoid any areas of skin that have broken skin, cuts, or scrapes. Before surgery Follow these steps when using a CHG cloth to clean before surgery (unless your health care provider gives you different instructions): Using the CHG cloth, vigorously scrub the part of your body where you will be having surgery. Scrub using a back-and-forth motion for 3 minutes. The area on your body should be completely wet with CHG when you are done scrubbing. Do not rinse. Discard the cloth and let the area air-dry. Do not put any substances on the area afterward, such as powder, lotion, or perfume. Put on clean clothes or pajamas. If it is the night before your surgery, sleep in clean sheets.  For general bathing Follow these steps when using CHG cloths for general bathing (unless your health care provider gives you different instructions). Use a separate CHG cloth for each area of your body. Make sure you wash between any folds of skin and between your fingers and toes. Wash your body in the following order, switching to a new cloth after each step: The front of your neck, shoulders, and chest. Both of your arms, under your  arms, and your hands. Your stomach and groin area, avoiding the genitals. Your right leg and foot. Your left leg and foot. The back of your neck, your back, and your buttocks. Do not rinse. Discard the cloth and let the area air-dry. Do not put any substances on your body afterward--such as powder, lotion, or perfume--unless you are told to do so by your health care provider. Only use lotions that are recommended by the manufacturer. Put on clean clothes or pajamas. Contact a health care provider if: Your skin gets irritated after scrubbing. You have questions about using your solution or cloth. You swallow any chlorhexidine. Call your local poison control center [phone] in the [place] Get help right away if: Your eyes itch badly, or they become very red or swollen. Your skin itches badly and is red or swollen. Your hearing changes. You have trouble seeing. You have swelling or tingling in your mouth or throat. You have trouble breathing. These symptoms may represent a serious problem that is an emergency. Do not wait to see if the symptoms will go away. Get medical help right away. Call your local emergency services (911 in the [place] Do not drive yourself to the hospital. Summary Chlorhexidine gluconate (CHG) is a germ-killing (antiseptic) solution that is used to clean the skin. Cleaning your skin with CHG may help to lower your risk for infection. You may be given CHG to use  for bathing. It may be in a bottle or in a prepackaged cloth to use on your skin. Carefully follow your health care provider's instructions and the instructions on the product label. Do not use CHG if you have a chlorhexidine allergy. Contact your health care provider if your skin gets irritated after scrubbing. This information is not intended to replace advice given to you by your health care provider. Make sure you discuss any questions you have with your health care provider. Document Revised: 03/24/2020  Document Reviewed: 03/24/2020 Elsevier Patient Education  2022 [employer].

## 2020-10-01 ENCOUNTER — Encounter (HOSPITAL_COMMUNITY)
Admission: RE | Admit: 2020-10-01 | Discharge: 2020-10-01 | Disposition: A | Discharge status: Home / Self Care | Payer: Medicaid Other | Source: Ambulatory Visit | Attending: General Surgery | Admitting: General Surgery

## 2020-10-13 NOTE — Patient Instructions (Signed)
Priscilla Castillo  10/13/2020     @PREFPERIOPPHARMACY @   Your procedure is scheduled on  10/20/2020.   Report to 10/22/2020 at  [phone] A.M.   Call this number if you have problems the morning of surgery:  [phone]   Remember:  Do not eat or drink after midnight.      Take these medicines the morning of surgery with A SIP OF WATER                                     None    Do not wear jewelry, make-up or nail polish.  Do not wear lotions, powders, or perfumes, or deodorant.  Do not shave 48 hours prior to surgery.  Men may shave face and neck.  Do not bring valuables to the hospital.  [facility] is not responsible for any belongings or valuables.  Contacts, dentures or bridgework may not be worn into surgery.  Leave your suitcase in the car.  After surgery it may be brought to your room.  For patients admitted to the hospital, discharge time will be determined by your treatment team.  Patients discharged the day of surgery will not be allowed to drive home and must have someone with them for 24 hours.    Special instructions:   DO NOT smoke tobacco or vape for 24 hours before your procedure.  Please read over the following fact sheets that you were given. Pain Booklet, Surgical Site Infection Prevention, Anesthesia Post-op Instructions, and Care and Recovery After Surgery      Minimally Invasive Cholecystectomy, Care After This sheet gives you information about how to care for yourself after your procedure. Your health care provider may also give you more specific instructions. If you have problems or questions, contact your health care provider. What can I expect after the procedure? After the procedure, it is common to have: Pain at your incision sites. You will be given medicines to control this pain. Mild nausea or vomiting. Bloating and possible shoulder pain from the gas that was used during the procedure. Follow these instructions at  home: Medicines Take over-the-counter and prescription medicines only as told by your health care provider. If you were prescribed an antibiotic medicine, take or use it as told by your health care provider. Do not stop using the antibiotic even if you start to feel better. Ask your health care provider if the medicine prescribed to you: Requires you to avoid driving or using machinery. Can cause constipation. You may need to take these actions to prevent or treat constipation: Drink enough fluid to keep your urine pale yellow. Take over-the-counter or prescription medicines. Eat foods that are high in fiber, such as beans, whole grains, and fresh fruits and vegetables. Limit foods that are high in fat and processed sugars, such as fried or sweet foods. Incision care  Follow instructions from your health care provider about how to take care of your incisions. Make sure you: Wash your hands with soap and water for at least 20 seconds before and after you change your bandage (dressing). If soap and water are not available, use hand sanitizer. Change your dressing as told by your health care provider. Leave stitches (sutures), skin glue, or adhesive strips in place. These skin closures may need to be in place for 2 weeks or longer. If adhesive strip edges  start to loosen and curl up, you may trim the loose edges. Do not remove adhesive strips completely unless your health care provider tells you to do that. Do not take baths, swim, or use a hot tub until your health care provider approves. Ask your health care provider if you may take showers. You may only be allowed to take sponge baths. Check your incision area every day for signs of infection. Check for: More redness, swelling, or pain. Fluid or blood. Warmth. Pus or a bad smell. Activity Rest as told by your health care provider. Avoid sitting for a long time without moving. Get up to take short walks every 1-2 hours. This is important to  improve blood flow and breathing. Ask for help if you feel weak or unsteady. Do not lift anything that is heavier than 10 lb (4.5 kg), or the limit that you are told, until your health care provider says that it is safe. Do not play contact sports until your health care provider approves. Do not return to work or school until your health care provider approves. Return to your normal activities as told by your health care provider. Ask your health care provider what activities are safe for you. General instructions If you were given a sedative during the procedure, it can affect you for several hours. Do not drive or operate machinery until your health care provider says that it is safe. Keep all follow-up visits as told by your health care provider. This is important. Contact a health care provider if: You develop a rash. You have more redness, swelling, or pain around your incisions. You have fluid or blood coming from your incisions. Your incisions feel warm to the touch. You have pus or a bad smell coming from your incisions. You have a fever. One or more of your incisions breaks open. Get help right away if: You have trouble breathing. You have chest pain. You have increasing pain in your shoulders. You faint or feel dizzy when you stand. You have severe pain in your abdomen. You have nausea or vomiting that lasts for more than one day. You have leg pain. Summary After your procedure, it is common to have pain at the incision sites. You may also have nausea or bloating. Follow your health care provider's instructions about medicine, activity restrictions, and caring for your incision areas. Do not do activities that require a lot of effort. Contact a health care provider if you have a fever or other signs of infection, such as more redness, swelling, or pain around the incisions. Get help right away if you have chest pain, increasing pain in the shoulders, or trouble breathing. This  information is not intended to replace advice given to you by your health care provider. Make sure you discuss any questions you have with your health care provider. Document Revised: 10/11/2018 Document Reviewed: 10/11/2018 Elsevier Patient Education  2022 [employer] General Anesthesia, Adult, Care After This sheet gives you information about how to care for yourself after your procedure. Your health care provider may also give you more specific instructions. If you have problems or questions, contact your health care provider. What can I expect after the procedure? After the procedure, the following side effects are common: Pain or discomfort at the IV site. Nausea. Vomiting. Sore throat. Trouble concentrating. Feeling cold or chills. Feeling weak or tired. Sleepiness and fatigue. Soreness and body aches. These side effects can affect parts of the body that were not involved in surgery.  Follow these instructions at home: For the time period you were told by your health care provider:  Rest. Do not participate in activities where you could fall or become injured. Do not drive or use machinery. Do not drink alcohol. Do not take sleeping pills or medicines that cause drowsiness. Do not make important decisions or sign legal documents. Do not take care of children on your own. Eating and drinking Follow any instructions from your health care provider about eating or drinking restrictions. When you feel hungry, start by eating small amounts of foods that are soft and easy to digest (bland), such as toast. Gradually return to your regular diet. Drink enough fluid to keep your urine pale yellow. If you vomit, rehydrate by drinking water, juice, or clear broth. General instructions If you have sleep apnea, surgery and certain medicines can increase your risk for breathing problems. Follow instructions from your health care provider about wearing your sleep device: Anytime you are  sleeping, including during daytime naps. While taking prescription pain medicines, sleeping medicines, or medicines that make you drowsy. Have a responsible adult stay with you for the time you are told. It is important to have someone help care for you until you are awake and alert. Return to your normal activities as told by your health care provider. Ask your health care provider what activities are safe for you. Take over-the-counter and prescription medicines only as told by your health care provider. If you smoke, do not smoke without supervision. Keep all follow-up visits as told by your health care provider. This is important. Contact a health care provider if: You have nausea or vomiting that does not get better with medicine. You cannot eat or drink without vomiting. You have pain that does not get better with medicine. You are unable to pass urine. You develop a skin rash. You have a fever. You have redness around your IV site that gets worse. Get help right away if: You have difficulty breathing. You have chest pain. You have blood in your urine or stool, or you vomit blood. Summary After the procedure, it is common to have a sore throat or nausea. It is also common to feel tired. Have a responsible adult stay with you for the time you are told. It is important to have someone help care for you until you are awake and alert. When you feel hungry, start by eating small amounts of foods that are soft and easy to digest (bland), such as toast. Gradually return to your regular diet. Drink enough fluid to keep your urine pale yellow. Return to your normal activities as told by your health care provider. Ask your health care provider what activities are safe for you. This information is not intended to replace advice given to you by your health care provider. Make sure you discuss any questions you have with your health care provider. Document Revised: 09/27/2019 Document Reviewed:  04/26/2019 Elsevier Patient Education  2022 Else[employer] Inc[employer] How to Use Chlorhexidine for Bathing Chlorhexidine gluconate (CHG) is a germ-killing (antiseptic) solution that is used to clean the skin. It can get rid of the bacteria that normally live on the skin and can keep them away for about 24 hours. To clean your skin with CHG, you may be given: A CHG solution to use in the shower or as part of a sponge bath. A prepackaged cloth that contains CHG. Cleaning your skin with CHG may help lower the risk for infection: While you are staying  in the intensive care unit of the hospital. If you have a vascular access, such as a central line, to provide short-term or long-term access to your veins. If you have a catheter to drain urine from your bladder. If you are on a ventilator. A ventilator is a machine that helps you breathe by moving air in and out of your lungs. After surgery. What are the risks? Risks of using CHG include: A skin reaction. Hearing loss, if CHG gets in your ears and you have a perforated eardrum. Eye injury, if CHG gets in your eyes and is not rinsed out. The CHG product catching fire. Make sure that you avoid smoking and flames after applying CHG to your skin. Do not use CHG: If you have a chlorhexidine allergy or have previously reacted to chlorhexidine. On babies younger than 51 months of age. How to use CHG solution Use CHG only as told by your health care provider, and follow the instructions on the label. Use the full amount of CHG as directed. Usually, this is one bottle. During a shower Follow these steps when using CHG solution during a shower (unless your health care provider gives you different instructions): Start the shower. Use your normal soap and shampoo to wash your face and hair. Turn off the shower or move out of the shower stream. Pour the CHG onto a clean washcloth. Do not use any type of brush or rough-edged sponge. Starting at your neck, lather your  body down to your toes. Make sure you follow these instructions: If you will be having surgery, pay special attention to the part of your body where you will be having surgery. Scrub this area for at least 1 minute. Do not use CHG on your head or face. If the solution gets into your ears or eyes, rinse them well with water. Avoid your genital area. Avoid any areas of skin that have broken skin, cuts, or scrapes. Scrub your back and under your arms. Make sure to wash skin folds. Let the lather sit on your skin for 1-2 minutes or as long as told by your health care provider. Thoroughly rinse your entire body in the shower. Make sure that all body creases and crevices are rinsed well. Dry off with a clean towel. Do not put any substances on your body afterward--such as powder, lotion, or perfume--unless you are told to do so by your health care provider. Only use lotions that are recommended by the manufacturer. Put on clean clothes or pajamas. If it is the night before your surgery, sleep in clean sheets.  During a sponge bath Follow these steps when using CHG solution during a sponge bath (unless your health care provider gives you different instructions): Use your normal soap and shampoo to wash your face and hair. Pour the CHG onto a clean washcloth. Starting at your neck, lather your body down to your toes. Make sure you follow these instructions: If you will be having surgery, pay special attention to the part of your body where you will be having surgery. Scrub this area for at least 1 minute. Do not use CHG on your head or face. If the solution gets into your ears or eyes, rinse them well with water. Avoid your genital area. Avoid any areas of skin that have broken skin, cuts, or scrapes. Scrub your back and under your arms. Make sure to wash skin folds. Let the lather sit on your skin for 1-2 minutes or as long as told  by your health care provider. Using a different clean, wet washcloth,  thoroughly rinse your entire body. Make sure that all body creases and crevices are rinsed well. Dry off with a clean towel. Do not put any substances on your body afterward--such as powder, lotion, or perfume--unless you are told to do so by your health care provider. Only use lotions that are recommended by the manufacturer. Put on clean clothes or pajamas. If it is the night before your surgery, sleep in clean sheets. How to use CHG prepackaged cloths Only use CHG cloths as told by your health care provider, and follow the instructions on the label. Use the CHG cloth on clean, dry skin. Do not use the CHG cloth on your head or face unless your health care provider tells you to. When washing with the CHG cloth: Avoid your genital area. Avoid any areas of skin that have broken skin, cuts, or scrapes. Before surgery Follow these steps when using a CHG cloth to clean before surgery (unless your health care provider gives you different instructions): Using the CHG cloth, vigorously scrub the part of your body where you will be having surgery. Scrub using a back-and-forth motion for 3 minutes. The area on your body should be completely wet with CHG when you are done scrubbing. Do not rinse. Discard the cloth and let the area air-dry. Do not put any substances on the area afterward, such as powder, lotion, or perfume. Put on clean clothes or pajamas. If it is the night before your surgery, sleep in clean sheets.  For general bathing Follow these steps when using CHG cloths for general bathing (unless your health care provider gives you different instructions). Use a separate CHG cloth for each area of your body. Make sure you wash between any folds of skin and between your fingers and toes. Wash your body in the following order, switching to a new cloth after each step: The front of your neck, shoulders, and chest. Both of your arms, under your arms, and your hands. Your stomach and groin area,  avoiding the genitals. Your right leg and foot. Your left leg and foot. The back of your neck, your back, and your buttocks. Do not rinse. Discard the cloth and let the area air-dry. Do not put any substances on your body afterward--such as powder, lotion, or perfume--unless you are told to do so by your health care provider. Only use lotions that are recommended by the manufacturer. Put on clean clothes or pajamas. Contact a health care provider if: Your skin gets irritated after scrubbing. You have questions about using your solution or cloth. You swallow any chlorhexidine. Call your local poison control center (202-627-6374 in the [place] Get help right away if: Your eyes itch badly, or they become very red or swollen. Your skin itches badly and is red or swollen. Your hearing changes. You have trouble seeing. You have swelling or tingling in your mouth or throat. You have trouble breathing. These symptoms may represent a serious problem that is an emergency. Do not wait to see if the symptoms will go away. Get medical help right away. Call your local emergency services (911 in the [place] Do not drive yourself to the hospital. Summary Chlorhexidine gluconate (CHG) is a germ-killing (antiseptic) solution that is used to clean the skin. Cleaning your skin with CHG may help to lower your risk for infection. You may be given CHG to use for bathing. It may be in a bottle or in a  prepackaged cloth to use on your skin. Carefully follow your health care provider's instructions and the instructions on the product label. Do not use CHG if you have a chlorhexidine allergy. Contact your health care provider if your skin gets irritated after scrubbing. This information is not intended to replace advice given to you by your health care provider. Make sure you discuss any questions you have with your health care provider. Document Revised: 03/24/2020 Document Reviewed: 03/24/2020 Elsevier Patient  Education  2022 [employer].

## 2020-10-16 ENCOUNTER — Encounter (HOSPITAL_COMMUNITY)
Admission: RE | Admit: 2020-10-16 | Discharge: 2020-10-16 | Disposition: A | Discharge status: Home / Self Care | Payer: Medicaid Other | Source: Ambulatory Visit | Attending: General Surgery | Admitting: General Surgery

## 2020-10-16 ENCOUNTER — Encounter (HOSPITAL_COMMUNITY): Payer: Self-pay

## 2020-10-16 ENCOUNTER — Other Ambulatory Visit: Payer: Self-pay

## 2020-10-16 DIAGNOSIS — Z01818 Encounter for other preprocedural examination: Secondary | ICD-10-CM | POA: Insufficient documentation

## 2020-10-16 LAB — HCG, SERUM, QUALITATIVE: Preg, Serum: NEGATIVE

## 2020-10-20 ENCOUNTER — Ambulatory Visit (HOSPITAL_COMMUNITY): Payer: Medicaid Other | Admitting: Anesthesiology

## 2020-10-20 ENCOUNTER — Encounter (HOSPITAL_COMMUNITY): Payer: Self-pay | Admitting: General Surgery

## 2020-10-20 ENCOUNTER — Encounter (HOSPITAL_COMMUNITY)
Admission: RE | Disposition: A | Discharge status: Home / Self Care | Payer: Self-pay | Source: Home / Self Care | Attending: General Surgery

## 2020-10-20 ENCOUNTER — Ambulatory Visit (HOSPITAL_COMMUNITY)
Admission: RE | Admit: 2020-10-20 | Discharge: 2020-10-20 | Disposition: A | Discharge status: Home / Self Care | Payer: Medicaid Other | Attending: General Surgery | Admitting: General Surgery

## 2020-10-20 DIAGNOSIS — F17210 Nicotine dependence, cigarettes, uncomplicated: Secondary | ICD-10-CM | POA: Diagnosis not present

## 2020-10-20 DIAGNOSIS — Z91040 Latex allergy status: Secondary | ICD-10-CM | POA: Diagnosis not present

## 2020-10-20 DIAGNOSIS — Z79899 Other long term (current) drug therapy: Secondary | ICD-10-CM | POA: Insufficient documentation

## 2020-10-20 DIAGNOSIS — Z886 Allergy status to analgesic agent status: Secondary | ICD-10-CM | POA: Diagnosis not present

## 2020-10-20 DIAGNOSIS — K8020 Calculus of gallbladder without cholecystitis without obstruction: Secondary | ICD-10-CM

## 2020-10-20 DIAGNOSIS — K8010 Calculus of gallbladder with chronic cholecystitis without obstruction: Secondary | ICD-10-CM | POA: Insufficient documentation

## 2020-10-20 HISTORY — PX: CHOLECYSTECTOMY: SHX55

## 2020-10-20 SURGERY — LAPAROSCOPIC CHOLECYSTECTOMY
Anesthesia: General | Site: Abdomen

## 2020-10-20 MED ORDER — DEXMEDETOMIDINE (PRECEDEX) IN NS 20 MCG/5ML (4 MCG/ML) IV SYRINGE
PREFILLED_SYRINGE | INTRAVENOUS | Status: DC | PRN
  Administered 2020-10-20: 8 ug via INTRAVENOUS

## 2020-10-20 MED ORDER — CHLORHEXIDINE GLUCONATE CLOTH 2 % EX PADS: 6.0000 | MEDICATED_PAD | Freq: Once | CUTANEOUS | Status: DC

## 2020-10-20 MED ORDER — SCOPOLAMINE 1 MG/3DAYS TD PT72
1.0000 | MEDICATED_PATCH | Freq: Once | TRANSDERMAL | Status: DC
  Administered 2020-10-20: 1.5 mg via TRANSDERMAL

## 2020-10-20 MED ORDER — ONDANSETRON HCL 4 MG/2ML IJ SOLN
INTRAMUSCULAR | Status: AC
  Filled 2020-10-20: qty 2

## 2020-10-20 MED ORDER — LIDOCAINE HCL (PF) 2 % IJ SOLN
INTRAMUSCULAR | Status: AC
  Filled 2020-10-20: qty 5

## 2020-10-20 MED ORDER — GLYCOPYRROLATE PF 0.2 MG/ML IJ SOSY
PREFILLED_SYRINGE | INTRAMUSCULAR | Status: DC | PRN
  Administered 2020-10-20: .2 mg via INTRAVENOUS

## 2020-10-20 MED ORDER — LACTATED RINGERS IV SOLN
INTRAVENOUS | Status: DC
  Administered 2020-10-20: 1000 mL via INTRAVENOUS

## 2020-10-20 MED ORDER — DEXMEDETOMIDINE (PRECEDEX) IN NS 20 MCG/5ML (4 MCG/ML) IV SYRINGE
PREFILLED_SYRINGE | INTRAVENOUS | Status: AC
  Filled 2020-10-20: qty 5

## 2020-10-20 MED ORDER — FENTANYL CITRATE (PF) 100 MCG/2ML IJ SOLN
INTRAMUSCULAR | Status: DC | PRN
  Administered 2020-10-20 (×2): 100 ug via INTRAVENOUS
  Administered 2020-10-20: 50 ug via INTRAVENOUS

## 2020-10-20 MED ORDER — HYDROCODONE-ACETAMINOPHEN 5-325 MG PO TABS: 1.0000 | ORAL_TABLET | ORAL | 0 refills | Status: DC | PRN

## 2020-10-20 MED ORDER — LABETALOL HCL 5 MG/ML IV SOLN
INTRAVENOUS | Status: AC
  Filled 2020-10-20: qty 4

## 2020-10-20 MED ORDER — HYDROMORPHONE HCL 1 MG/ML IJ SOLN
0.2500 mg | INTRAMUSCULAR | Status: DC | PRN
  Administered 2020-10-20: 0.25 mg via INTRAVENOUS
  Administered 2020-10-20: 0.5 mg via INTRAVENOUS
  Administered 2020-10-20: 0.25 mg via INTRAVENOUS
  Administered 2020-10-20: 0.5 mg via INTRAVENOUS
  Filled 2020-10-20 (×3): qty 0.5

## 2020-10-20 MED ORDER — ONDANSETRON HCL 4 MG/2ML IJ SOLN: 4.0000 mg | Freq: Once | INTRAMUSCULAR | Status: DC | PRN

## 2020-10-20 MED ORDER — SCOPOLAMINE 1 MG/3DAYS TD PT72
MEDICATED_PATCH | TRANSDERMAL | Status: AC
  Filled 2020-10-20: qty 1

## 2020-10-20 MED ORDER — LIDOCAINE HCL (CARDIAC) PF 100 MG/5ML IV SOSY
PREFILLED_SYRINGE | INTRAVENOUS | Status: DC | PRN
  Administered 2020-10-20: 60 mg via INTRATRACHEAL

## 2020-10-20 MED ORDER — MIDAZOLAM HCL 2 MG/2ML IJ SOLN
INTRAMUSCULAR | Status: AC
  Filled 2020-10-20: qty 2

## 2020-10-20 MED ORDER — HYDROCODONE-ACETAMINOPHEN 5-325 MG PO TABS
1.0000 | ORAL_TABLET | Freq: Once | ORAL | Status: AC
  Administered 2020-10-20: 1 via ORAL

## 2020-10-20 MED ORDER — BUPIVACAINE LIPOSOME 1.3 % IJ SUSP
INTRAMUSCULAR | Status: DC | PRN
  Administered 2020-10-20: 20 mL

## 2020-10-20 MED ORDER — MIDAZOLAM HCL 2 MG/2ML IJ SOLN
2.0000 mg | Freq: Once | INTRAMUSCULAR | Status: AC
  Administered 2020-10-20: 2 mg via INTRAVENOUS

## 2020-10-20 MED ORDER — ONDANSETRON HCL 4 MG/2ML IJ SOLN
INTRAMUSCULAR | Status: DC | PRN
  Administered 2020-10-20: 4 mg via INTRAVENOUS

## 2020-10-20 MED ORDER — SODIUM CHLORIDE 0.9 % IR SOLN
Status: DC | PRN
  Administered 2020-10-20: 1000 mL

## 2020-10-20 MED ORDER — CHLORHEXIDINE GLUCONATE 0.12 % MT SOLN
15.0000 mL | Freq: Once | OROMUCOSAL | Status: AC
  Administered 2020-10-20: 15 mL via OROMUCOSAL
  Filled 2020-10-20: qty 15

## 2020-10-20 MED ORDER — HEMOSTATIC AGENTS (NO CHARGE) OPTIME
TOPICAL | Status: DC | PRN
  Administered 2020-10-20: 1 via TOPICAL

## 2020-10-20 MED ORDER — HYDROCODONE-ACETAMINOPHEN 5-325 MG PO TABS
ORAL_TABLET | ORAL | Status: AC
  Filled 2020-10-20: qty 1

## 2020-10-20 MED ORDER — ORAL CARE MOUTH RINSE: 15.0000 mL | Freq: Once | OROMUCOSAL | Status: AC

## 2020-10-20 MED ORDER — MIDAZOLAM HCL 5 MG/5ML IJ SOLN
INTRAMUSCULAR | Status: DC | PRN
  Administered 2020-10-20 (×2): 1 mg via INTRAVENOUS

## 2020-10-20 MED ORDER — BUPIVACAINE LIPOSOME 1.3 % IJ SUSP
INTRAMUSCULAR | Status: AC
  Filled 2020-10-20: qty 20

## 2020-10-20 MED ORDER — SUGAMMADEX SODIUM 200 MG/2ML IV SOLN
INTRAVENOUS | Status: DC | PRN
  Administered 2020-10-20: 200 mg via INTRAVENOUS

## 2020-10-20 MED ORDER — FENTANYL CITRATE (PF) 250 MCG/5ML IJ SOLN
INTRAMUSCULAR | Status: AC
  Filled 2020-10-20: qty 5

## 2020-10-20 MED ORDER — MEPERIDINE HCL 50 MG/ML IJ SOLN: 6.2500 mg | INTRAMUSCULAR | Status: DC | PRN

## 2020-10-20 MED ORDER — GLYCOPYRROLATE PF 0.2 MG/ML IJ SOSY
PREFILLED_SYRINGE | INTRAMUSCULAR | Status: AC
  Filled 2020-10-20: qty 1

## 2020-10-20 MED ORDER — DEXAMETHASONE SODIUM PHOSPHATE 10 MG/ML IJ SOLN
INTRAMUSCULAR | Status: AC
  Filled 2020-10-20: qty 1

## 2020-10-20 MED ORDER — CEFAZOLIN SODIUM-DEXTROSE 2-4 GM/100ML-% IV SOLN
2.0000 g | INTRAVENOUS | Status: AC
  Administered 2020-10-20: 2 g via INTRAVENOUS
  Filled 2020-10-20: qty 100

## 2020-10-20 MED ORDER — PROPOFOL 10 MG/ML IV BOLUS
INTRAVENOUS | Status: DC | PRN
  Administered 2020-10-20: 200 mg via INTRAVENOUS

## 2020-10-20 MED ORDER — LABETALOL HCL 5 MG/ML IV SOLN
INTRAVENOUS | Status: DC | PRN
  Administered 2020-10-20: 5 mg via INTRAVENOUS

## 2020-10-20 MED ORDER — PROPOFOL 10 MG/ML IV BOLUS
INTRAVENOUS | Status: AC
  Filled 2020-10-20: qty 20

## 2020-10-20 SURGICAL SUPPLY — 42 items
ADH SKN CLS APL DERMABOND .7 (GAUZE/BANDAGES/DRESSINGS) ×1
APL PRP STRL LF DISP 70% ISPRP (MISCELLANEOUS) ×1
APPLIER CLIP ROT 10 11.4 M/L (STAPLE) ×2
APR CLP MED LRG 11.4X10 (STAPLE) ×1
BAG RETRIEVAL 10 (BASKET) ×1
CHLORAPREP W/TINT 26 (MISCELLANEOUS) ×2 IMPLANT
CLIP APPLIE ROT 10 11.4 M/L (STAPLE) ×1 IMPLANT
CLOTH BEACON ORANGE TIMEOUT ST (SAFETY) ×2 IMPLANT
COVER LIGHT HANDLE STERIS (MISCELLANEOUS) ×4 IMPLANT
DERMABOND ADVANCED (GAUZE/BANDAGES/DRESSINGS) ×1
DERMABOND ADVANCED .7 DNX12 (GAUZE/BANDAGES/DRESSINGS) ×1 IMPLANT
ELECT REM PT RETURN 9FT ADLT (ELECTROSURGICAL) ×2
ELECTRODE REM PT RTRN 9FT ADLT (ELECTROSURGICAL) ×1 IMPLANT
GAUZE 4X4 16PLY ~~LOC~~+RFID DBL (SPONGE) ×2 IMPLANT
GLOVE SURG POLYISO LF SZ7 (GLOVE) ×2 IMPLANT
GLOVE SURG POLYISO LF SZ7.5 (GLOVE) ×2 IMPLANT
GLOVE SURG UNDER POLY LF SZ7 (GLOVE) ×4 IMPLANT
GLOVE SURG UNDER POLY LF SZ7.5 (GLOVE) ×2 IMPLANT
GOWN STRL REUS W/TWL LRG LVL3 (GOWN DISPOSABLE) ×6 IMPLANT
HEMOSTAT SNOW SURGICEL 2X4 (HEMOSTASIS) ×2 IMPLANT
INST SET LAPROSCOPIC AP (KITS) ×2 IMPLANT
KIT TURNOVER KIT A (KITS) ×2 IMPLANT
MANIFOLD NEPTUNE II (INSTRUMENTS) ×2 IMPLANT
NEEDLE HYPO 18GX1.5 BLUNT FILL (NEEDLE) ×2 IMPLANT
NEEDLE HYPO 21X1.5 SAFETY (NEEDLE) ×2 IMPLANT
NEEDLE INSUFFLATION 14GA 120MM (NEEDLE) ×2 IMPLANT
NS IRRIG 1000ML POUR BTL (IV SOLUTION) ×2 IMPLANT
PACK LAP CHOLE LZT030E (CUSTOM PROCEDURE TRAY) ×2 IMPLANT
PAD ARMBOARD 7.5X6 YLW CONV (MISCELLANEOUS) ×2 IMPLANT
SET BASIN LINEN APH (SET/KITS/TRAYS/PACK) ×2 IMPLANT
SET TUBE SMOKE EVAC HIGH FLOW (TUBING) ×2 IMPLANT
SLEEVE ENDOPATH XCEL 5M (ENDOMECHANICALS) ×2 IMPLANT
SUT MNCRL AB 4-0 PS2 18 (SUTURE) ×4 IMPLANT
SUT VICRYL 0 UR6 27IN ABS (SUTURE) ×2 IMPLANT
SYR 20ML LL LF (SYRINGE) ×4 IMPLANT
SYS BAG RETRIEVAL 10MM (BASKET) ×1
SYSTEM BAG RETRIEVAL 10MM (BASKET) ×1 IMPLANT
TROCAR ENDO BLADELESS 11MM (ENDOMECHANICALS) ×2 IMPLANT
TROCAR XCEL NON-BLD 5MMX100MML (ENDOMECHANICALS) ×2 IMPLANT
TROCAR XCEL UNIV SLVE 11M 100M (ENDOMECHANICALS) ×2 IMPLANT
TUBE CONNECTING 12X1/4 (SUCTIONS) ×2 IMPLANT
WARMER LAPAROSCOPE (MISCELLANEOUS) ×2 IMPLANT

## 2020-10-20 NOTE — Anesthesia Postprocedure Evaluation (Signed)
Anesthesia Post Note  Patient: Priscilla Castillo  Procedure(s) Performed: LAPAROSCOPIC CHOLECYSTECTOMY (Abdomen)  Patient location during evaluation: PACU Anesthesia Type: General Level of consciousness: awake and alert and oriented Respiratory status: spontaneous breathing and respiratory function stable Cardiovascular status: blood pressure returned to baseline and stable Postop Assessment: no apparent nausea or vomiting Anesthetic complications: no   No notable events documented.   Last Vitals:  Vitals:   10/20/20 0915 10/20/20 0931  BP: 130/80 133/89  Pulse: 99 85  Resp: 12 15  Temp:  36.8 C  SpO2: 100% 100%    Last Pain:  Vitals:   10/20/20 0931  TempSrc: Axillary  PainSc: 7                  Siriah Treat C [place] Plake

## 2020-10-20 NOTE — Transfer of Care (Signed)
Immediate Anesthesia Transfer of Care Note  Patient: Priscilla Castillo  Procedure(s) Performed: LAPAROSCOPIC CHOLECYSTECTOMY (Abdomen)  Patient Location: PACU  Anesthesia Type:General  Level of Consciousness: awake, alert  and oriented  Airway & Oxygen Therapy: Patient Spontanous Breathing and Patient connected to face mask oxygen  Post-op Assessment: Report given to RN, Post -op Vital signs reviewed and stable and Patient moving all extremities X 4  Post vital signs: Reviewed and stable  Last Vitals:  Vitals Value Taken Time  BP    Temp    Pulse    Resp    SpO2      Last Pain:  Vitals:   10/20/20 0637  TempSrc: Oral  PainSc: 0-No pain      Patients Stated Pain Goal: 5 (10/20/20 3785)  Complications: No notable events documented.

## 2020-10-20 NOTE — Anesthesia Procedure Notes (Signed)
Procedure Name: Intubation Date/Time: 10/20/2020 7:45 AM Performed by: Lenox Ahr, CRNA Pre-anesthesia Checklist: Patient identified, Patient being monitored, Timeout performed, Emergency Drugs available and Suction available Patient Re-evaluated:Patient Re-evaluated prior to induction Oxygen Delivery Method: Circle System Utilized Preoxygenation: Pre-oxygenation with 100% oxygen Induction Type: IV induction Ventilation: Mask ventilation without difficulty Laryngoscope Size: Miller and 2 Grade View: Grade II Tube type: Oral Tube size: 7.0 mm Number of attempts: 1 Airway Equipment and Method: stylet Placement Confirmation: ETT inserted through vocal cords under direct vision, positive ETCO2 and breath sounds checked- equal and bilateral Secured at: 24 cm Tube secured with: Tape Dental Injury: Teeth and Oropharynx as per pre-operative assessment

## 2020-10-20 NOTE — Anesthesia Preprocedure Evaluation (Signed)
Anesthesia Evaluation  Patient identified by MRN, date of birth, ID band Patient awake    Reviewed: Allergy & Precautions, NPO status , Patient's Chart, lab work & pertinent test results  Airway Mallampati: II  TM Distance: >3 FB Neck ROM: Full    Dental  (+) Dental Advisory Given, Chipped, Missing   Pulmonary neg pulmonary ROS, Current Smoker and Patient abstained from smoking.,    Pulmonary exam normal breath sounds clear to auscultation       Cardiovascular hypertension (not on meds), Normal cardiovascular exam Rhythm:Regular Rate:Normal     Neuro/Psych negative neurological ROS  negative psych ROS   GI/Hepatic Neg liver ROS, GERD  Medicated,  Endo/Other  negative endocrine ROS  Renal/GU negative Renal ROS  negative genitourinary   Musculoskeletal negative musculoskeletal ROS (+)   Abdominal   Peds negative pediatric ROS (+)  Hematology negative hematology ROS (+)   Anesthesia Other Findings   Reproductive/Obstetrics negative OB ROS                            Anesthesia Physical Anesthesia Plan  ASA: 2  Anesthesia Plan: General   Post-op Pain Management:    Induction: Intravenous  PONV Risk Score and Plan: Ondansetron, Dexamethasone, Midazolam and Scopolamine patch - Pre-op  Airway Management Planned: Oral ETT  Additional Equipment:   Intra-op Plan:   Post-operative Plan: Extubation in OR  Informed Consent: I have reviewed the patients History and Physical, chart, labs and discussed the procedure including the risks, benefits and alternatives for the proposed anesthesia with the patient or authorized representative who has indicated his/her understanding and acceptance.     Dental advisory given  Plan Discussed with: CRNA and Surgeon  Anesthesia Plan Comments:         Anesthesia Quick Evaluation

## 2020-10-20 NOTE — Op Note (Signed)
Patient:  Priscilla Castillo  DOB:  03/20/1990  MRN:  [id number]   Preop Diagnosis: Biliary colic, cholelithiasis  Postop Diagnosis: Same  Procedure: Laparoscopic cholecystectomy  Surgeon: Franky Macho, MD  Anes: General endotracheal  Indications: Patient is a 30 year old black female who presents with biliary colic secondary to cholelithiasis.  The risks and benefits of the procedure including bleeding, infection, hepatobiliary injury, the possibility of an open procedure were fully explained to the patient, who gave informed consent.  Procedure note: The patient was placed in supine position.  After induction of general endotracheal anesthesia, the abdomen was prepped and draped using the usual sterile technique with ChloraPrep.  Surgical site confirmation was performed.  A supraumbilical incision was made down to the fascia.  A Veress needle was introduced into the abdominal cavity and confirmation of placement was done using the saline drop test.  The abdomen was then insufflated to 15 mmHg pressure.  An 11 mm trocar was introduced into the abdominal cavity under direct visualization without difficulty.  The patient was placed in reverse Trendelenburg position and an additional 11 mm trocar was placed in the epigastric region and 5 mm trochars were placed in the right upper quadrant and right flank regions.  The liver was inspected and noted to be within normal limits.  The gallbladder was retracted in a dynamic fashion in order to provide a critical view of the triangle of Calot.  The cystic duct was first identified.  Its junction to the infundibulum was fully identified.  Endoclips were placed proximally and distally on the cystic duct, and the cystic duct was divided.  This was likewise done and the cystic artery.  The gallbladder was freed away from the gallbladder fossa using Bovie electrocautery.  The gallbladder was delivered through the epigastric trocar site using an Endo Catch bag.   The gallbladder fossa was inspected and no abnormal bleeding or bile leakage was noted.  Surgicel was placed in the gallbladder fossa.  All fluid and air were then evacuated from the abdominal cavity prior to removal of the trochars.  All wounds were irrigated with normal saline.  All wounds were injected with Exparel.  The supraumbilical fascia was reapproximated using an 0 Vicryl interrupted suture.  All skin incisions were closed using a 4-0 Monocryl subcuticular suture.  Dermabond was applied.  All tape and needle counts were correct at the end of the procedure.  The patient was extubated in the operating room and transferred to PACU in stable condition.  Complications: None  EBL: Minimal  Specimen: Gallbladder

## 2020-10-20 NOTE — Interval H&P Note (Signed)
History and Physical Interval Note:  10/20/2020 7:17 AM  Priscilla Castillo  has presented today for surgery, with the diagnosis of Cholelithiasis.  The various methods of treatment have been discussed with the patient and family. After consideration of risks, benefits and other options for treatment, the patient has consented to  Procedure(s): LAPAROSCOPIC CHOLECYSTECTOMY (N/A) as a surgical intervention.  The patient's history has been reviewed, patient examined, no change in status, stable for surgery.  I have reviewed the patient's chart and labs.  Questions were answered to the patient's satisfaction.     Franky Macho

## 2020-10-21 ENCOUNTER — Other Ambulatory Visit: Payer: Self-pay | Admitting: General Surgery

## 2020-10-21 ENCOUNTER — Encounter (HOSPITAL_COMMUNITY): Payer: Self-pay | Admitting: General Surgery

## 2020-10-21 DIAGNOSIS — Z09 Encounter for follow-up examination after completed treatment for conditions other than malignant neoplasm: Secondary | ICD-10-CM

## 2020-10-21 LAB — SURGICAL PATHOLOGY

## 2020-10-21 MED ORDER — OXYCODONE-ACETAMINOPHEN 10-325 MG PO TABS: 1.0000 | ORAL_TABLET | Freq: Four times a day (QID) | ORAL | 0 refills | Status: DC | PRN

## 2020-10-21 NOTE — Progress Notes (Unsigned)
Change in therapy from norco to percocet.

## 2020-10-27 ENCOUNTER — Telehealth: Payer: Self-pay | Admitting: General Surgery

## 2020-10-27 NOTE — Telephone Encounter (Signed)
Patient called requesting a post-op follow up due to she fell in her shower "the other day" and fell on her side that she had surgery on and was having a hard time catching her breath. Spoke with nurse, Andrey Campanile, who stated that the patient needed to go to the emergency department if she is having difficulty breathing. Patient verbalized understanding of recommendation.

## 2020-11-03 ENCOUNTER — Telehealth (INDEPENDENT_AMBULATORY_CARE_PROVIDER_SITE_OTHER): Payer: Medicaid Other | Admitting: General Surgery

## 2020-11-03 DIAGNOSIS — Z09 Encounter for follow-up examination after completed treatment for conditions other than malignant neoplasm: Secondary | ICD-10-CM

## 2020-11-03 NOTE — Telephone Encounter (Signed)
Attempted to contact patient x 2.  Unable to leave message as it has not been set up.

## 2020-11-10 NOTE — Telephone Encounter (Signed)
Patient called back stating that she is doing fine with no issues.

## 2020-12-05 ENCOUNTER — Other Ambulatory Visit: Payer: Medicaid Other

## 2021-01-05 ENCOUNTER — Other Ambulatory Visit: Payer: Medicaid Other | Admitting: Women's Health

## 2021-03-13 ENCOUNTER — Other Ambulatory Visit: Payer: Self-pay

## 2021-03-13 ENCOUNTER — Ambulatory Visit
Admission: EM | Admit: 2021-03-13 | Discharge: 2021-03-13 | Disposition: A | Discharge status: Home / Self Care | Payer: Medicaid Other

## 2021-03-13 DIAGNOSIS — F17200 Nicotine dependence, unspecified, uncomplicated: Secondary | ICD-10-CM

## 2021-03-13 DIAGNOSIS — I10 Essential (primary) hypertension: Secondary | ICD-10-CM

## 2021-03-13 DIAGNOSIS — M546 Pain in thoracic spine: Secondary | ICD-10-CM

## 2021-03-13 MED ORDER — ACETAMINOPHEN 325 MG PO TABS: 650.0000 mg | ORAL_TABLET | Freq: Four times a day (QID) | ORAL | 0 refills | Status: DC | PRN

## 2021-03-13 MED ORDER — HYDROCHLOROTHIAZIDE 12.5 MG PO TABS: 12.5000 mg | ORAL_TABLET | Freq: Every day | ORAL | 0 refills | Status: DC

## 2021-03-13 MED ORDER — TIZANIDINE HCL 4 MG PO TABS: 4.0000 mg | ORAL_TABLET | Freq: Every day | ORAL | 0 refills | Status: DC

## 2021-03-13 NOTE — ED Triage Notes (Signed)
Pt reports her blood pressure was 161/119, 157/113 and 156/120 at work, and she had headache, vomiting and felt flushed. Pt needs blood pressure to be recheck to go back to work. Pt denies chest pain, shortness of breath, headache, dizziness, vision changes, nausea, vomiting.   Pt reports right sided middle back pain since this morning. Pain is worse when moving.

## 2021-03-13 NOTE — ED Provider Notes (Signed)
Priscilla Castillo   MRN: [id number] DOB: [birth year]  Subjective:   Priscilla Castillo is a 31 y.o. female presenting for checkup on her blood pressure.  Patient has a history of hypertension, stopped taking blood pressure medication on her own.  She does have a PCP but has not followed up with them since last year.  She is also a smoker.  No dizziness, headaches, chest pain, shortness of breath, diaphoresis, nausea, vomiting, abdominal pain, hematuria.  Patient reports that she is under a lot of stress both with having a new job that is very physically strenuous.  She also has stressors at home with relationships.  Does not hydrate very well.  Does not practice a healthy diet.  She does report that she has had some right-sided thoracic back pain worse when she is working and is very positional.  No cough.  No fall, trauma.  No current facility-administered medications for this encounter.  Current Outpatient Medications:    diphenhydrAMINE (BENADRYL) 25 MG tablet, Take 1 tablet (25 mg total) by mouth every 6 (six) hours. (Patient taking differently: Take 25 mg by mouth every 6 (six) hours as needed for allergies.), Disp: 20 tablet, Rfl: 0   ondansetron (ZOFRAN ODT) 4 MG disintegrating tablet, Take 1 tablet (4 mg total) by mouth every 8 (eight) hours as needed for nausea or vomiting. (Patient not taking: Reported on 09/25/2020), Disp: 20 tablet, Rfl: 0   oxyCODONE-acetaminophen (PERCOCET) 10-325 MG tablet, Take 1 tablet by mouth every 6 (six) hours as needed for pain., Disp: 25 tablet, Rfl: 0   pantoprazole (PROTONIX) 20 MG tablet, Take 1 tablet (20 mg total) by mouth daily. (Patient not taking: Reported on 09/25/2020), Disp: 30 tablet, Rfl: 0   sertraline (ZOLOFT) 50 MG tablet, Take 50 mg by mouth daily., Disp: , Rfl:    Allergies  Allergen Reactions   Shellfish Allergy Anaphylaxis   Banana Hives and Swelling   Latex Itching   Nsaids Hives    Past Medical History:  Diagnosis Date    GERD (gastroesophageal reflux disease)    Hypertension    Pelvic inflammatory disease (PID)      Past Surgical History:  Procedure Laterality Date   CESAREAN SECTION     x 2   CHOLECYSTECTOMY N/A 10/20/2020   Procedure: LAPAROSCOPIC CHOLECYSTECTOMY;  Surgeon: Aviva Signs, MD;  Location: [facility];  Service: General;  Laterality: N/A;   WISDOM TOOTH EXTRACTION      History reviewed. No pertinent family history.  Social History   Tobacco Use   Smoking status: Every Day    Packs/day: 0.33    Years: 15.00    Pack years: 4.95    Types: Cigarettes   Smokeless tobacco: Never  Vaping Use   Vaping Use: Never used  Substance Use Topics   Alcohol use: Yes    Alcohol/week: 2.0 standard drinks    Types: 2 Glasses of wine per week    Comment: occ.    Drug use: Not Currently    Comment: denies    ROS   Objective:   Vitals: BP (!) 161/103 (BP Location: Right Arm)    Pulse 85    Temp 98.9 F (37.2 C) (Oral)    Resp 18    LMP  (Within Months) Comment: 1 month   SpO2 99%   BP Readings from Last 3 Encounters:  03/13/21 (!) 161/103  10/20/20 133/89  10/16/20 (!) 148/95   Physical Exam Constitutional:      General: She  is not in acute distress.    Appearance: Normal appearance. She is well-developed. She is not ill-appearing, toxic-appearing or diaphoretic.  HENT:     Head: Normocephalic and atraumatic.     Right Ear: External ear normal.     Left Ear: External ear normal.     Nose: Nose normal.     Mouth/Throat:     Mouth: Mucous membranes are moist.  Eyes:     General: No scleral icterus.       Right eye: No discharge.        Left eye: No discharge.     Extraocular Movements: Extraocular movements intact.     Conjunctiva/sclera: Conjunctivae normal.  Cardiovascular:     Rate and Rhythm: Normal rate.     Heart sounds: No murmur heard.   No friction rub. No gallop.  Pulmonary:     Effort: Pulmonary effort is normal. No respiratory distress.     Breath sounds: No  stridor. No wheezing, rhonchi or rales.  Chest:     Chest wall: No tenderness.  Abdominal:     General: Bowel sounds are normal. There is no distension.     Palpations: Abdomen is soft. There is no mass.     Tenderness: There is no abdominal tenderness. There is no right CVA tenderness, left CVA tenderness, guarding or rebound.  Musculoskeletal:     Comments: Full range of motion throughout.  Strength 5/5 for upper and lower extremities.  Patient ambulates without any assistance at expected pace.  No ecchymosis, swelling, lacerations or abrasions.  Patient does have paraspinal muscle tenderness along the right-sided thoracic region of her back excluding the midline.    Skin:    General: Skin is warm and dry.     Findings: No rash.  Neurological:     General: No focal deficit present.     Mental Status: She is alert and oriented to person, place, and time.     Cranial Nerves: No cranial nerve deficit.     Motor: No weakness.     Coordination: Coordination normal.     Gait: Gait normal.     Comments: Negative Romberg and pronator drift.  Psychiatric:        Mood and Affect: Mood normal.        Behavior: Behavior normal.        Thought Content: Thought content normal.        Judgment: Judgment normal.    Assessment and Plan :   PDMP not reviewed this encounter.  1. Essential hypertension   2. Acute right-sided thoracic back pain   3. Smoker    No signs of hypertensive urgency or emergency.  Emphasized need to have medical compliance with her blood pressure, hypertensive friendly diet.  I will start her on hydrochlorothiazide.  Follow-up as soon as possible with PCP.  Suspect that her back pain is related to the her work and deconditioning that she just started this current job and admits that she is not used to straining herself physically.  Recommended Tylenol and tizanidine.  Emphasized need to hydrate much better. Counseled patient on potential for adverse effects with medications  prescribed/recommended today, ER and return-to-clinic precautions discussed, patient verbalized understanding.    Jaynee Eagles, PA-C 03/13/21 1218

## 2021-03-13 NOTE — Discharge Instructions (Addendum)

## 2021-04-16 ENCOUNTER — Encounter: Payer: Self-pay | Admitting: Internal Medicine

## 2021-05-04 NOTE — Progress Notes (Deleted)
? ? ?GI Office Note   ? ?Referring Provider: Juliette Alcide, MD ?Primary Care Physician:  Juliette Alcide, MD  ? ? ?Chief Complaint  ? ?No chief complaint on file. ? ? ? ?History of Present Illness  ? ?Priscilla Castillo is a 31 y.o. female with a history of GERD, HTN, PID presenting today at the request of Burdine, Ananias Pilgrim, MD for evaluation of GERD, gas, and bloating.  ? ? ?Laparoscopic cholecystectomy 10/20/20 due to frequent recurrent RUQ pain and cholelithiasis noted on imaging.  ? ? ?GERD ?Patient complains of {GERD cc:13194}. Symptoms have been present for approximately {numbers 0-10:33138} {time units:11}. Symptoms include {GERD sx:13195}. The patient denies {GERD sx:13195}. Symptoms appear to be worsened by {GERD exacer:13199}. Risk factors present for GERD include {GERD risk factors:13164}. Risk factors absent for GERD are {GERD risk factors:13164}. Studies performed so far include {GERD workup:13196}. Treatments tried so far include {GERD tx:13197}. Results of treatment: {GERD tx results:13198}. Currently, the symptoms are {severity:5014} and occur approximately {0-10:33138} times per {time units:11}. ? ? ? ?Past Medical History:  ?Diagnosis Date  ? GERD (gastroesophageal reflux disease)   ? Hypertension   ? Pelvic inflammatory disease (PID)   ? ? ?Past Surgical History:  ?Procedure Laterality Date  ? CESAREAN SECTION    ? x 2  ? CHOLECYSTECTOMY N/A 10/20/2020  ? Procedure: LAPAROSCOPIC CHOLECYSTECTOMY;  Surgeon: Franky Macho, MD;  Location: [facility];  Service: General;  Laterality: N/A;  ? WISDOM TOOTH EXTRACTION    ? ? ?Current Outpatient Medications  ?Medication Sig Dispense Refill  ? acetaminophen (TYLENOL) 325 MG tablet Take 2 tablets (650 mg total) by mouth every 6 (six) hours as needed. 30 tablet 0  ? diphenhydrAMINE (BENADRYL) 25 MG tablet Take 1 tablet (25 mg total) by mouth every 6 (six) hours. (Patient taking differently: Take 25 mg by mouth every 6 (six) hours as needed for allergies.) 20  tablet 0  ? hydrochlorothiazide (HYDRODIURIL) 12.5 MG tablet Take 1 tablet (12.5 mg total) by mouth daily. 90 tablet 0  ? ondansetron (ZOFRAN ODT) 4 MG disintegrating tablet Take 1 tablet (4 mg total) by mouth every 8 (eight) hours as needed for nausea or vomiting. (Patient not taking: Reported on 09/25/2020) 20 tablet 0  ? oxyCODONE-acetaminophen (PERCOCET) 10-325 MG tablet Take 1 tablet by mouth every 6 (six) hours as needed for pain. 25 tablet 0  ? pantoprazole (PROTONIX) 20 MG tablet Take 1 tablet (20 mg total) by mouth daily. (Patient not taking: Reported on 09/25/2020) 30 tablet 0  ? sertraline (ZOLOFT) 50 MG tablet Take 50 mg by mouth daily.    ? tiZANidine (ZANAFLEX) 4 MG tablet Take 1 tablet (4 mg total) by mouth at bedtime. 30 tablet 0  ? ?No current facility-administered medications for this visit.  ? ? ?Allergies as of 05/05/2021 - Review Complete 03/13/2021  ?Allergen Reaction Noted  ? Shellfish allergy Anaphylaxis 10/28/2012  ? Banana Hives and Swelling 08/30/2014  ? Latex Itching 06/11/2015  ? Nsaids Hives 08/03/2019  ? ? ?No family history on file. ? ?Social History  ? ?Socioeconomic History  ? Marital status: Single  ?  Spouse name: Not on file  ? Number of children: 3  ? Years of education: Not on file  ? Highest education level: Not on file  ?Occupational History  ? Not on file  ?Tobacco Use  ? Smoking status: Every Day  ?  Packs/day: 0.33  ?  Years: 15.00  ?  Pack years: 4.95  ?  Types: Cigarettes  ? Smokeless tobacco: Never  ?Vaping Use  ? Vaping Use: Never used  ?Substance and Sexual Activity  ? Alcohol use: Yes  ?  Alcohol/week: 2.0 standard drinks  ?  Types: 2 Glasses of wine per week  ?  Comment: occ.   ? Drug use: Not Currently  ?  Comment: denies  ? Sexual activity: Not Currently  ?  Birth control/protection: None  ?Other Topics Concern  ? Not on file  ?Social History Narrative  ? Not on file  ? ?Social Determinants of Health  ? ?Financial Resource Strain: Not on file  ?Food Insecurity: Not on  file  ?Transportation Needs: Not on file  ?Physical Activity: Not on file  ?Stress: Not on file  ?Social Connections: Not on file  ?Intimate Partner Violence: Not on file  ? ? ? ?Review of Systems  ? ?Gen: Denies any fever, chills, fatigue, weight loss, lack of appetite.  ?CV: Denies chest pain, heart palpitations, peripheral edema, syncope.  ?Resp: Denies shortness of breath at rest or with exertion. Denies wheezing or cough.  ?GI: Denies dysphagia or odynophagia. Denies jaundice, hematemesis, fecal incontinence. ?GU : Denies urinary burning, urinary frequency, urinary hesitancy ?MS: Denies joint pain, muscle weakness, cramps, or limitation of movement.  ?Derm: Denies rash, itching, dry skin ?Psych: Denies depression, anxiety, memory loss, and confusion ?Heme: Denies bruising, bleeding, and enlarged lymph nodes. ? ? ?Physical Exam  ? ?There were no vitals taken for this visit. ?General:   Alert and oriented. Pleasant and cooperative. Well-nourished and well-developed.  ?Head:  Normocephalic and atraumatic. ?Eyes:  Without icterus, sclera clear and conjunctiva pink.  ?Ears:  Normal auditory acuity. ?Mouth:  No deformity or lesions, oral mucosa pink.  ?Lungs:  Clear to auscultation bilaterally. No wheezes, rales, or rhonchi. No distress.  ?Heart:  S1, S2 present without murmurs appreciated.  ?Abdomen:  +BS, soft, non-tender and non-distended. No HSM noted. No guarding or rebound. No masses appreciated.  ?Rectal:  Deferred  ?Msk:  Symmetrical without gross deformities. Normal posture. ?Extremities:  Without edema. ?Neurologic:  Alert and  oriented x4;  grossly normal neurologically. ?Skin:  Intact without significant lesions or rashes. ?Psych:  Alert and cooperative. Normal mood and affect. ? ? ?Assessment  ? ?Priscilla Castillo is a 32 y.o. female with a history of GERD, HTN, PID  presenting today with  ? ? ?PLAN  ? ?*** ? ? ? ?Brooke Bonito, MSN, FNP-BC, AGACNP-BC ?[facility] ?

## 2021-05-05 ENCOUNTER — Ambulatory Visit: Payer: Medicaid Other | Admitting: Gastroenterology

## 2021-06-10 ENCOUNTER — Emergency Department (HOSPITAL_COMMUNITY)
Admission: EM | Admit: 2021-06-10 | Discharge: 2021-06-10 | Disposition: A | Discharge status: Home / Self Care | Payer: Medicaid Other | Attending: Emergency Medicine | Admitting: Emergency Medicine

## 2021-06-10 ENCOUNTER — Encounter (HOSPITAL_COMMUNITY): Payer: Self-pay

## 2021-06-10 ENCOUNTER — Other Ambulatory Visit: Payer: Self-pay

## 2021-06-10 ENCOUNTER — Emergency Department (HOSPITAL_COMMUNITY): Payer: Medicaid Other

## 2021-06-10 DIAGNOSIS — O209 Hemorrhage in early pregnancy, unspecified: Secondary | ICD-10-CM | POA: Insufficient documentation

## 2021-06-10 DIAGNOSIS — N939 Abnormal uterine and vaginal bleeding, unspecified: Secondary | ICD-10-CM

## 2021-06-10 DIAGNOSIS — Z3A01 Less than 8 weeks gestation of pregnancy: Secondary | ICD-10-CM | POA: Diagnosis not present

## 2021-06-10 DIAGNOSIS — R102 Pelvic and perineal pain: Secondary | ICD-10-CM

## 2021-06-10 DIAGNOSIS — Z91040 Latex allergy status: Secondary | ICD-10-CM | POA: Diagnosis not present

## 2021-06-10 LAB — COMPREHENSIVE METABOLIC PANEL
ALT: 12 U/L (ref 0–44)
AST: 14 U/L — ABNORMAL LOW (ref 15–41)
Albumin: 3.8 g/dL (ref 3.5–5.0)
Alkaline Phosphatase: 41 U/L (ref 38–126)
Anion gap: 10 (ref 5–15)
BUN: 13 mg/dL (ref 6–20)
CO2: 22 mmol/L (ref 22–32)
Calcium: 8.8 mg/dL — ABNORMAL LOW (ref 8.9–10.3)
Chloride: 105 mmol/L (ref 98–111)
Creatinine, Ser: 0.91 mg/dL (ref 0.44–1.00)
GFR, Estimated: >60 mL/min (ref >60)
Glucose, Bld: 96 mg/dL (ref 70–99)
Potassium: 3.3 mmol/L — ABNORMAL LOW (ref 3.5–5.1)
Sodium: 137 mmol/L (ref 135–145)
Total Bilirubin: 0.9 mg/dL (ref 0.3–1.2)
Total Protein: 6.8 g/dL (ref 6.5–8.1)

## 2021-06-10 LAB — CBC WITH DIFFERENTIAL/PLATELET
Abs Immature Granulocytes: 0.02 10*3/uL (ref 0.00–0.07)
Basophils Absolute: 0 10*3/uL (ref 0.0–0.1)
Basophils Relative: 0 %
Eosinophils Absolute: 0.2 10*3/uL (ref 0.0–0.5)
Eosinophils Relative: 2 %
HCT: 40 % (ref 36.0–46.0)
Hemoglobin: 13.2 g/dL (ref 12.0–15.0)
Immature Granulocytes: 0 %
Lymphocytes Relative: 37 %
Lymphs Abs: 3.1 10*3/uL (ref 0.7–4.0)
MCH: 31.3 pg (ref 26.0–34.0)
MCHC: 33 g/dL (ref 30.0–36.0)
MCV: 94.8 fL (ref 80.0–100.0)
Monocytes Absolute: 0.5 10*3/uL (ref 0.1–1.0)
Monocytes Relative: 6 %
Neutro Abs: 4.5 10*3/uL (ref 1.7–7.7)
Neutrophils Relative %: 55 %
Platelets: 199 10*3/uL (ref 150–400)
RBC: 4.22 MIL/uL (ref 3.87–5.11)
RDW: 14.8 % (ref 11.5–15.5)
WBC: 8.2 10*3/uL (ref 4.0–10.5)
nRBC: 0 % (ref 0.0–0.2)

## 2021-06-10 LAB — POC URINE PREG, ED: Preg Test, Ur: POSITIVE — AB

## 2021-06-10 LAB — ABO/RH: ABO/RH(D): A POS

## 2021-06-10 MED ORDER — ACETAMINOPHEN 325 MG PO TABS
650.0000 mg | ORAL_TABLET | Freq: Once | ORAL | Status: DC
  Filled 2021-06-10: qty 2

## 2021-06-10 MED ORDER — SODIUM CHLORIDE 0.9 % IV BOLUS: 1000.0000 mL | Freq: Once | INTRAVENOUS | Status: DC

## 2021-06-10 NOTE — ED Notes (Addendum)
Before pt was placed up for discharge, Pt came out of her room dressed with her belongings. Asked pt where she was going and she said the DR told her she could leave. This Rn asked her if she could wait for her discharge papers and pt replied her ride was here and she was ready to go and didn't need her papers. Pt signed for discharge at charge nurse station and left ?

## 2021-06-10 NOTE — ED Provider Notes (Signed)
[facility] EMERGENCY DEPARTMENT Provider Note   CSN: [id number] Arrival date & time: 06/10/21  1738     History  Chief Complaint  Patient presents with   Abdominal Cramping   Vaginal Bleeding    Priscilla Castillo is a 31 y.o. female.  Patient with no pertinent past medical history presents today with complaints of abdominal cramping and vaginal bleeding.  She states that she had a positive pregnancy test several weeks ago but has not followed up with an OB/GYN yet.  She has an appointment next Monday with OB/GYN to establish care. 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  States that her pain began this morning and is located in her bilateral lower pelvic region and was associated with vaginal bleeding. She states that bleeding was minimal in nature and has mostly subsided at this time. States that given her significant history of miscarriage she is concerned for same. She denies any fevers, chills, nausea, vomiting, or diarrhea.  The history is provided by the patient. No language interpreter was used.  Abdominal Cramping  Vaginal Bleeding Associated symptoms: no nausea       Home Medications Prior to Admission medications   Medication Sig Start Date End Date Taking? Authorizing Provider  acetaminophen (TYLENOL) 325 MG tablet Take 2 tablets (650 mg total) by mouth every 6 (six) hours as needed. 03/13/21   Wallis BambergMani, Mario, PA-C  diphenhydrAMINE (BENADRYL) 25 MG tablet Take 1 tablet (25 mg total) by mouth every 6 (six) hours. Patient taking differently: Take 25 mg by mouth every 6 (six) hours as needed for allergies. 12/07/18   Vanetta MuldersZackowski, Scott, MD  hydrochlorothiazide (HYDRODIURIL) 12.5 MG tablet Take 1 tablet (12.5 mg total) by mouth daily. 03/13/21   Wallis BambergMani, Mario, PA-C  ondansetron (ZOFRAN ODT) 4 MG disintegrating tablet Take 1 tablet (4 mg total) by mouth every 8 (eight) hours as needed for nausea or vomiting. Patient not taking: Reported on 09/25/2020 08/31/20    Glynn Octaveancour, Stephen, MD  oxyCODONE-acetaminophen (PERCOCET) 10-325 MG tablet Take 1 tablet by mouth every 6 (six) hours as needed for pain. 10/21/20   Franky MachoJenkins, Mark, MD  pantoprazole (PROTONIX) 20 MG tablet Take 1 tablet (20 mg total) by mouth daily. Patient not taking: Reported on 09/25/2020 08/16/20   Bethann BerkshireZammit, Joseph, MD  sertraline (ZOLOFT) 50 MG tablet Take 50 mg by mouth daily. 01/13/21   [provider]  tiZANidine (ZANAFLEX) 4 MG tablet Take 1 tablet (4 mg total) by mouth at bedtime. 03/13/21   Wallis BambergMani, Mario, PA-C      Allergies    Shellfish allergy, Banana, Latex, and Nsaids    Review of Systems   Review of Systems  Gastrointestinal:  Negative for diarrhea, nausea and vomiting.  Genitourinary:  Positive for pelvic pain and vaginal bleeding.  All other systems reviewed and are negative.  Physical Exam Updated Vital Signs BP 106/66   Pulse 88   Temp 98.8 F (37.1 C) (Oral)   Resp 10   Ht 5\' 1"  (1.549 m)   Wt 91.8 kg   LMP 10/08/2020 (Approximate)   SpO2 100%   BMI 38.22 kg/m  Physical Exam Vitals and nursing note reviewed. Exam conducted with a chaperone present.  Constitutional:      General: She is not in acute distress.    Appearance: Normal appearance. She is normal weight. She is not ill-appearing, toxic-appearing or diaphoretic.  HENT:     Head: Normocephalic and atraumatic.  Cardiovascular:  Rate and Rhythm: Normal rate.  Pulmonary:     Effort: Pulmonary effort is normal. No respiratory distress.  Abdominal:     Tenderness: There is abdominal tenderness in the right lower quadrant, suprapubic area and left lower quadrant.  Genitourinary:    Comments: Cervix closed without any vaginal bleeding or discharge present. No CMT Musculoskeletal:        General: Normal range of motion.     Cervical back: Normal range of motion.  Skin:    General: Skin is warm and dry.  Neurological:     General: No focal deficit present.     Mental Status: She is alert.   Psychiatric:        Mood and Affect: Mood normal.        Behavior: Behavior normal.    ED Results / Procedures / Treatments   Labs (all labs ordered are listed, but only abnormal results are displayed) Labs Reviewed  COMPREHENSIVE METABOLIC PANEL - Abnormal; Notable for the following components:      Result Value   Potassium 3.3 (*)    Calcium 8.8 (*)    AST 14 (*)    All other components within normal limits  POC URINE PREG, ED - Abnormal; Notable for the following components:   Preg Test, Ur POSITIVE (*)    All other components within normal limits  CBC WITH DIFFERENTIAL/PLATELET  ABO/RH    EKG None  Radiology US OB LESS THAN 14 WEEKS WITH OB TRANSVAGINAL  Result Date: 06/10/2021 CLINICAL DATA:  Pregnant, cramping, spotting EXAM: OBSTETRIC <14 WK [place] AND TRANSVAGINAL OB US TECHNIQUE: Both transabdominal and transvaginal ultrasound examinations were performed for complete evaluation of the gestation as well as the maternal uterus, adnexal regions, and pelvic cul-de-sac. Transvaginal technique was performed to assess early pregnancy. COMPARISON:  None Available. FINDINGS: Intrauterine gestational sac: Single Yolk sac:  Not Visualized. Embryo:  Not Visualized. MSD: 6.2 mm   5 w   2 d Subchorionic hemorrhage:  None visualized. Maternal uterus/adnexae: Bilateral ovaries are within normal limits. No free fluid. IMPRESSION: Single early intrauterine gestational sac, measuring 5 weeks 2 days by mean sac diameter, without yolk sac or fetal pole. Consider follow-up pelvic ultrasound in 10-14 days to confirm viability as clinically warranted. Electronically Signed   By: Charline Bills M.D.   On: 06/10/2021 20:43    Procedures Procedures    Medications Ordered in ED Medications  sodium chloride 0.9 % bolus 1,000 mL (1,000 mLs Intravenous Patient Refused/Not Given 06/10/21 2203)  acetaminophen (TYLENOL) tablet 650 mg (650 mg Oral Not Given 06/10/21 2202)    ED Course/ Medical Decision  Making/ A&P                           Medical Decision Making Amount and/or Complexity of Data Reviewed Labs: ordered. Radiology: ordered.   This patient presents to the ED for concern of pelvic pain/vaginal bleeding, this involves an extensive number of treatment options, and is a complaint that carries with it a high risk of complications and morbidity.    Co morbidities that complicate the patient evaluation  Hx miscarriage   Lab Tests:  I Ordered, and personally interpreted labs.  The pertinent results include:  Upreg positive. No leukocytosis or anemia. No other acute laboratory findings   Imaging Studies ordered:  I ordered imaging studies including Pelvic US  I independently visualized and interpreted imaging which showed  Single early intrauterine gestational sac, measuring  5 weeks 2 days by mean sac diameter, without yolk sac or fetal pole. Consider follow-up pelvic ultrasound in 10-14 days to confirm viability as clinically warranted. I agree with the radiologist interpretation   Problem List / ED Course / Critical interventions / Medication management  I ordered medication including Tylenol  for pain and fluids for dehydration both of which patient refused  Patient presents today with vaginal bleeding and pelvic pain.  She is afebrile, nontoxic-appearing, and in no acute distress with reassuring vital signs.  Urine pregnancy positive, pelvic ultrasound shows 5-week 2-day intrauterine gestational sac.  Pelvic exam performed which was unremarkable for bleeding or other abnormality.  The cervix is closed.  Patient is nontoxic, nonseptic appearing, in no apparent distress.  Patient's pain and other symptoms adequately managed in emergency department.  Fluid bolus offered but refused.  Labs, imaging and vitals reviewed.  Patient does not meet the SIRS or Sepsis criteria.  On repeat exam patient does not have a surgical abdomin and there are no peritoneal signs.  No indication  of appendicitis, bowel obstruction, bowel perforation, cholecystitis, diverticulitis, PID or ectopic pregnancy.  Patient has an appointment with her OB/GYN on Monday to establish care.  I have discussed with her the importance of being this appointment and discussing her symptoms with her OB/GYN at this appointment.  She will need a repeat ultrasound 2 weeks to determine viability of pregnancy.  I have also discussed reasons to return immediately to the ER.  Patient expresses understanding and agrees with plan.  Discharged in stable condition.  Findings and plan of care discussed with supervising physician Dr. Hyacinth Meeker who is in agreement.    Final Clinical Impression(s) / ED Diagnoses Final diagnoses:  Vaginal bleeding  Pelvic pain in female  Less than [redacted] weeks gestation of pregnancy    Rx / DC Orders ED Discharge Orders     None     An After Visit Summary was printed and given to the patient.     Vear Clock 06/10/21 2346    Eber Hong, MD 06/11/21 1053

## 2021-06-10 NOTE — ED Triage Notes (Signed)
Pt c/o lower abdominal cramping and vaginal bleeding starting this morning.  Pain score 8/10.  Pt reports positive pregnancy test around 12 weeks ago.   ?

## 2021-06-10 NOTE — ED Notes (Signed)
Assisted with chaperone for pt's ultrasound . Alvaro Aungst ?

## 2021-06-10 NOTE — ED Notes (Signed)
Provider at bedside

## 2021-06-10 NOTE — Discharge Instructions (Signed)
As we discussed, your ultrasound did show a 5-week 2-day old intrauterine pregnancy.  Your physical exam was unremarkable for any active bleeding or opening of your cervix.  I recommend following up with your OB/GYN at your appointment on Monday for further evaluation and management of your pregnancy. ? ?Return if development of any new or worsening symptoms. ?

## 2021-06-10 NOTE — ED Notes (Signed)
Pt is requesting pain medication. EDP made aware ?

## 2021-06-15 ENCOUNTER — Ambulatory Visit (INDEPENDENT_AMBULATORY_CARE_PROVIDER_SITE_OTHER): Payer: Medicaid Other | Admitting: *Deleted

## 2021-06-15 DIAGNOSIS — Z3490 Encounter for supervision of normal pregnancy, unspecified, unspecified trimester: Secondary | ICD-10-CM

## 2021-06-15 NOTE — Progress Notes (Signed)
Pt already had pregnancy and ultrasound at [facility] ED. Priscilla Castillo reviewed chart and recommended hcg level to be drawn and repeat ultrasound in 2 weeks. Pt agreeable to plan. Is unsure if she plans to keep pregnancy. Advised if she decides to not keep ultrasound to call and cancel appointment. No other questions at this time.

## 2021-06-16 LAB — BETA HCG QUANT (REF LAB): hCG Quant: 21358 m[IU]/mL

## 2021-07-08 LAB — CYTOLOGY - PAP: Pap Smear: NEGATIVE

## 2021-07-21 ENCOUNTER — Other Ambulatory Visit: Payer: Medicaid Other

## 2021-07-30 ENCOUNTER — Encounter: Payer: Self-pay | Admitting: *Deleted

## 2021-07-30 DIAGNOSIS — I10 Essential (primary) hypertension: Secondary | ICD-10-CM | POA: Insufficient documentation

## 2021-07-30 DIAGNOSIS — O0990 Supervision of high risk pregnancy, unspecified, unspecified trimester: Secondary | ICD-10-CM | POA: Insufficient documentation

## 2021-07-30 DIAGNOSIS — O09299 Supervision of pregnancy with other poor reproductive or obstetric history, unspecified trimester: Secondary | ICD-10-CM | POA: Insufficient documentation

## 2021-08-03 ENCOUNTER — Encounter: Payer: Self-pay | Admitting: Advanced Practice Midwife

## 2021-08-03 DIAGNOSIS — Z98891 History of uterine scar from previous surgery: Secondary | ICD-10-CM | POA: Insufficient documentation

## 2021-08-11 ENCOUNTER — Other Ambulatory Visit: Payer: Self-pay | Admitting: Obstetrics & Gynecology

## 2021-08-11 DIAGNOSIS — O3680X0 Pregnancy with inconclusive fetal viability, not applicable or unspecified: Secondary | ICD-10-CM

## 2021-08-12 ENCOUNTER — Other Ambulatory Visit: Payer: Medicaid Other

## 2021-08-12 ENCOUNTER — Ambulatory Visit: Payer: Medicaid Other | Admitting: *Deleted

## 2021-08-13 ENCOUNTER — Ambulatory Visit (INDEPENDENT_AMBULATORY_CARE_PROVIDER_SITE_OTHER): Payer: Medicaid Other | Admitting: Advanced Practice Midwife

## 2021-08-13 ENCOUNTER — Ambulatory Visit: Payer: Medicaid Other | Admitting: *Deleted

## 2021-08-13 DIAGNOSIS — Z98891 History of uterine scar from previous surgery: Secondary | ICD-10-CM

## 2021-08-13 DIAGNOSIS — O0991 Supervision of high risk pregnancy, unspecified, first trimester: Secondary | ICD-10-CM

## 2021-08-13 DIAGNOSIS — I10 Essential (primary) hypertension: Secondary | ICD-10-CM

## 2021-08-13 DIAGNOSIS — O09299 Supervision of pregnancy with other poor reproductive or obstetric history, unspecified trimester: Secondary | ICD-10-CM

## 2021-08-13 NOTE — Progress Notes (Signed)
Error--pt no showed for appt

## 2021-09-17 ENCOUNTER — Ambulatory Visit (INDEPENDENT_AMBULATORY_CARE_PROVIDER_SITE_OTHER): Payer: Medicaid Other

## 2021-09-17 ENCOUNTER — Ambulatory Visit
Admission: EM | Admit: 2021-09-17 | Discharge: 2021-09-17 | Disposition: A | Discharge status: Home / Self Care | Payer: Medicaid Other

## 2021-09-17 DIAGNOSIS — M25571 Pain in right ankle and joints of right foot: Secondary | ICD-10-CM

## 2021-09-17 DIAGNOSIS — W19XXXA Unspecified fall, initial encounter: Secondary | ICD-10-CM

## 2021-09-17 DIAGNOSIS — M7989 Other specified soft tissue disorders: Secondary | ICD-10-CM | POA: Diagnosis not present

## 2021-09-17 DIAGNOSIS — M79661 Pain in right lower leg: Secondary | ICD-10-CM

## 2021-09-17 MED ORDER — ACETAMINOPHEN ER 650 MG PO TBCR: 650.0000 mg | EXTENDED_RELEASE_TABLET | Freq: Three times a day (TID) | ORAL | 0 refills | Status: DC | PRN

## 2021-09-17 NOTE — ED Triage Notes (Signed)
Pt reports pain and swelling in right lower leg, after she fell from 2 steps and hear a crack sound., Pt reports right leg feels weak and can not put weight on. Ibuprofen and Tylenol gives no relief.

## 2021-09-17 NOTE — ED Provider Notes (Signed)
[facility]    CSN: [id number] Arrival date & time: 09/17/21  1221      History   Chief Complaint Chief Complaint  Patient presents with   Fall   Leg Pain    HPI Priscilla Castillo is a 31 y.o. female.   The history is provided by the patient.   Patient presents for complaints of right lower leg pain that started this morning after she fell from 2 steps.  She states when she fell she heard a "crack".  Since that time, patient has been unable to bear weight on the right leg, and states that the right leg feels weak.  She reports that there is also swelling and tenderness to the right lower leg.  She denies injury or trauma to the right ankle, foot, or knee, numbness, tingling, or radiation of pain.  Patient states that she is unable to flex and extend the foot due to the pain in the right lower leg.  She states that the right lower leg is also "sensitive" to touch..  She denies any previous injury to the right lower leg.  She is taken over-the-counter pain medicines for her symptoms.  Past Medical History:  Diagnosis Date   GERD (gastroesophageal reflux disease)    Hypertension    Pelvic inflammatory disease (PID)     Patient Active Problem List   Diagnosis Date Noted   History of cesarean delivery 08/03/2021   History of pre-eclampsia in prior pregnancy, currently pregnant 07/30/2021   Supervision of high-risk pregnancy 07/30/2021   Hypertension 07/30/2021   Calculus of gallbladder without cholecystitis without obstruction    Infected cyst of Bartholin's gland duct 12/25/2019   Trichomonas infection 10/16/2018    Past Surgical History:  Procedure Laterality Date   CESAREAN SECTION     x 2   CHOLECYSTECTOMY N/A 10/20/2020   Procedure: LAPAROSCOPIC CHOLECYSTECTOMY;  Surgeon: Franky Macho, MD;  Location: [facility];  Service: General;  Laterality: N/A;   WISDOM TOOTH EXTRACTION      OB History     Gravida  8   Para  3   Term  2   Preterm  1   AB  4    Living  3      SAB  2   IAB      Ectopic      Multiple  0   Live Births  3            Home Medications    Prior to Admission medications   Medication Sig Start Date End Date Taking? Authorizing Provider  acetaminophen (TYLENOL 8 HOUR) 650 MG CR tablet Take 1 tablet (650 mg total) by mouth every 8 (eight) hours as needed for pain. 09/17/21  Yes Lesean Woolverton-Warren, Sadie Haber, NP  ibuprofen (ADVIL) 200 MG tablet Take 200 mg by mouth every 6 (six) hours as needed.   Yes [provider]  busPIRone (BUSPAR) 5 MG tablet Take 10 mg by mouth 3 (three) times daily. 03/19/21   [provider]  diphenhydrAMINE (BENADRYL) 25 MG tablet Take 1 tablet (25 mg total) by mouth every 6 (six) hours. Patient taking differently: Take 25 mg by mouth every 6 (six) hours as needed for allergies. 12/07/18   Vanetta Mulders, MD  hydrochlorothiazide (HYDRODIURIL) 12.5 MG tablet Take 1 tablet (12.5 mg total) by mouth daily. 03/13/21   Wallis Bamberg, PA-C  ondansetron (ZOFRAN ODT) 4 MG disintegrating tablet Take 1 tablet (4 mg total) by mouth every 8 (  eight) hours as needed for nausea or vomiting. 08/31/20   Rancour, Jeannett Senior, MD  pantoprazole (PROTONIX) 20 MG tablet Take 1 tablet (20 mg total) by mouth daily. Patient not taking: Reported on 06/10/2021 08/16/20   Bethann Berkshire, MD  sertraline (ZOLOFT) 50 MG tablet Take 50 mg by mouth daily. 06/28/21   [provider]  tiZANidine (ZANAFLEX) 4 MG tablet Take 1 tablet (4 mg total) by mouth at bedtime. Patient not taking: Reported on 06/10/2021 03/13/21   Wallis Bamberg, PA-C    Family History History reviewed. No pertinent family history.  Social History Social History   Tobacco Use   Smoking status: Every Day    Packs/day: 0.33    Years: 15.00    Total pack years: 4.95    Types: Cigarettes   Smokeless tobacco: Never  Vaping Use   Vaping Use: Never used  Substance Use Topics   Alcohol use: Yes    Alcohol/week: 2.0 standard drinks of  alcohol    Types: 2 Glasses of wine per week    Comment: occ.    Drug use: Not Currently    Comment: denies     Allergies   Shellfish allergy, Banana, Latex, and Nsaids   Review of Systems Review of Systems Per HPI  Physical Exam Triage Vital Signs ED Triage Vitals  Enc Vitals Group     BP 09/17/21 1301 (!) 145/99     Pulse Rate 09/17/21 1301 97     Resp 09/17/21 1301 16     Temp 09/17/21 1301 98.4 F (36.9 C)     Temp Source 09/17/21 1301 Oral     SpO2 09/17/21 1301 99 %     Weight --      Height --      Head Circumference --      Peak Flow --      Pain Score 09/17/21 1303 10     Pain Loc --      Pain Edu? --      Excl. in GC? --    No data found.  Updated Vital Signs BP (!) 145/99 (BP Location: Right Arm)   Pulse 97   Temp 98.4 F (36.9 C) (Oral)   Resp 16   LMP  (Within Weeks) Comment: 2 weeks  SpO2 99%   Breastfeeding No   Visual Acuity Right Eye Distance:   Left Eye Distance:   Bilateral Distance:    Right Eye Near:   Left Eye Near:    Bilateral Near:     Physical Exam Vitals reviewed.  Constitutional:      General: She is not in acute distress.    Appearance: She is well-developed.  HENT:     Head: Normocephalic.  Eyes:     Extraocular Movements: Extraocular movements intact.     Pupils: Pupils are equal, round, and reactive to light.  Pulmonary:     Effort: Pulmonary effort is normal.  Abdominal:     General: Bowel sounds are normal. There is no distension.     Palpations: Abdomen is soft.     Tenderness: There is no abdominal tenderness. There is no guarding or rebound.  Genitourinary:    Vagina: Normal. No vaginal discharge.  Musculoskeletal:     Cervical back: Normal range of motion.     Right lower leg: Swelling and tenderness present. No deformity, lacerations or bony tenderness. No edema.  Skin:    General: Skin is warm and dry.     Findings: No erythema  or rash.  Neurological:     General: No focal deficit present.      Mental Status: She is alert and oriented to person, place, and time.     Cranial Nerves: No cranial nerve deficit.  Psychiatric:        Mood and Affect: Mood normal.        Behavior: Behavior normal.      UC Treatments / Results  Labs (all labs ordered are listed, but only abnormal results are displayed) Labs Reviewed - No data to display  EKG   Radiology DG Tibia/Fibula Right  Result Date: 09/17/2021 CLINICAL DATA:  Trauma, fall EXAM: RIGHT TIBIA AND FIBULA - 2 VIEW COMPARISON:  None FINDINGS: No recent fracture or dislocation is seen. 2 mm smooth marginated calcification inferior to the lateral malleolus may be residual from previous injury. There is mild stranding in the subcutaneous plane in the calf. There are no opaque foreign bodies. IMPRESSION: No recent fracture or dislocation is seen in right tibia and fibula. Electronically Signed   By: Elmer Picker M.D.   On: 09/17/2021 13:26    Procedures Procedures (including critical care time)  Medications Ordered in UC Medications - No data to display  Initial Impression / Assessment and Plan / UC Course  I have reviewed the triage vital signs and the nursing notes.  Pertinent labs & imaging results that were available during my care of the patient were reviewed by me and considered in my medical decision making (see chart for details).  Patient presents with right lower leg pain after she fell down 2 steps earlier today.  On exam, patient has generalized tenderness to the right lower leg.  There is no obvious deformity or ecchymosis present.  X-rays of the tib-fib were negative.  Patient was provided crutches and encouraged to begin weightbearing as soon as possible.  Patient was given ibuprofen prescription for pain management.  Supportive care recommendations were provided to the patient along with a work note.  Patient was advised to follow-up with [facility] or [facility] if symptoms fail to improve.   Information for both was provided.  Final Clinical Impressions(s) / UC Diagnoses   Final diagnoses:  Pain and swelling of right lower leg  Fall, initial encounter     Discharge Instructions      Your x-rays are negative for fracture or dislocation. Take medication as prescribed. Recommend the application of ice or heat as needed.  Apply ice for pain or swelling, heat for spasm or stiffness.  Apply for 20 minutes, remove for 1 hour, then repeat. RICE therapy rest, ice, compression and elevation. Weightbearing as tolerated until symptoms fully improved. If symptoms fail to improve within the next 2 to 4 weeks, recommend following up with orthopedics.  You can follow-up with [facility] at [phone] or with [facility] at [phone].     ED Prescriptions     Medication Sig Dispense Auth. Provider   acetaminophen (TYLENOL 8 HOUR) 650 MG CR tablet Take 1 tablet (650 mg total) by mouth every 8 (eight) hours as needed for pain. 30 tablet Parminder Trapani-Warren, Alda Lea, NP      PDMP not reviewed this encounter.   Tish Men, NP 09/17/21 1400

## 2021-09-17 NOTE — Discharge Instructions (Addendum)
Your x-rays are negative for fracture or dislocation. Take medication as prescribed. Recommend the application of ice or heat as needed.  Apply ice for pain or swelling, heat for spasm or stiffness.  Apply for 20 minutes, remove for 1 hour, then repeat. RICE therapy rest, ice, compression and elevation. Weightbearing as tolerated until symptoms fully improved. If symptoms fail to improve within the next 2 to 4 weeks, recommend following up with orthopedics.  You can follow-up with [facility] at [phone] or with [facility] at [phone].

## 2021-09-23 ENCOUNTER — Encounter: Payer: Self-pay | Admitting: Orthopaedic Surgery

## 2021-09-23 ENCOUNTER — Telehealth: Payer: Self-pay | Admitting: Orthopaedic Surgery

## 2021-09-23 ENCOUNTER — Ambulatory Visit (INDEPENDENT_AMBULATORY_CARE_PROVIDER_SITE_OTHER): Payer: Medicaid Other | Admitting: Orthopaedic Surgery

## 2021-09-23 DIAGNOSIS — M79661 Pain in right lower leg: Secondary | ICD-10-CM

## 2021-09-23 MED ORDER — HYDROCODONE-ACETAMINOPHEN 5-325 MG PO TABS
ORAL_TABLET | ORAL | 0 refills | Status: DC
Start: 2021-09-23 — End: 2021-10-27

## 2021-09-23 NOTE — Telephone Encounter (Signed)
Please advise. Patient stating prescribed pain medication doesn't work, however, Percocet does.
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
## 2021-09-23 NOTE — Telephone Encounter (Signed)
Patient called back after today's visit to relay that she was notified by pharmacy - [facility] on [address] [place]  That her Hydrocodone prescription is ready. States she has relayed to other doctor (Emergency room) that she cannot take it as 'it does not work' - said Percocet has worked before. Please advise.

## 2021-09-24 NOTE — Telephone Encounter (Signed)
Called patient to advise her that provider will not be changing pain medication. No answer, message stated call could not be completed after ringing several times.

## 2021-09-29 ENCOUNTER — Encounter: Payer: Self-pay | Admitting: Orthopaedic Surgery

## 2021-09-29 ENCOUNTER — Ambulatory Visit: Payer: Medicaid Other | Admitting: Orthopaedic Surgery

## 2021-09-29 DIAGNOSIS — M79661 Pain in right lower leg: Secondary | ICD-10-CM | POA: Diagnosis not present

## 2021-09-29 MED ORDER — PREDNISONE 5 MG (21) PO TBPK: ORAL_TABLET | ORAL | 0 refills | Status: DC

## 2021-09-29 MED ORDER — OXYCODONE-ACETAMINOPHEN 5-325 MG PO TABS: ORAL_TABLET | ORAL | 0 refills | Status: DC

## 2021-09-29 NOTE — Patient Instructions (Addendum)
Take Prednisone as directed until you finish it.   Take pain medication as directed  Aspercreme, Biofreeze, Blue Emu or Voltaren Gel over the counter 2-3 times daily. Rub into area well each use for best results.   Use heat/ice as needed for your pain.   We will see you in 1 week

## 2021-09-29 NOTE — Progress Notes (Signed)
My leg still hurts.  She has continued pain of the right calf but the pain has moved from the upper center to the right lateral lower leg about midway.  She has no swelling, no redness, no numbness.  She is using two crutches now.  She had a problem with the hydrocodone.  She says in the past she has used Percocet and it did better for her.  I will switch.  I had noticed on the state site she has had hydrocodone in the past.  The right calf is tender posteriorly but no defect is felt, no swelling and normal Homan's sign.  Pain with weight bearing.  No redness.  NV intact.  Encounter Diagnosis  Name Primary?   Pain in right lower leg Yes   I have reviewed the [place] Controlled Substance Reporting System web site prior to prescribing narcotic medicine for this patient.  I will also give prednisone.  She cannot take NSAIDs.  Return in one week.  She may need MRI.  Call if any problem.  Precautions discussed.  Electronically Signed Darreld Mclean, MD 9/5/20232:50 PM

## 2021-10-06 ENCOUNTER — Ambulatory Visit: Payer: Medicaid Other | Admitting: Orthopaedic Surgery

## 2021-10-07 ENCOUNTER — Ambulatory Visit: Payer: Medicaid Other | Admitting: Orthopaedic Surgery

## 2021-10-08 ENCOUNTER — Ambulatory Visit: Payer: Medicaid Other | Admitting: Orthopaedic Surgery

## 2021-10-08 ENCOUNTER — Encounter: Payer: Self-pay | Admitting: Orthopaedic Surgery

## 2021-10-08 VITALS — BP 153/92 | HR 98 | Ht 61.0 in | Wt 193.0 lb

## 2021-10-08 DIAGNOSIS — M79661 Pain in right lower leg: Secondary | ICD-10-CM

## 2021-10-08 MED ORDER — OXYCODONE-ACETAMINOPHEN 5-325 MG PO TABS: ORAL_TABLET | ORAL | 0 refills | Status: DC

## 2021-10-08 NOTE — Addendum Note (Signed)
Addended by: Recardo Evangelist A on: 10/08/2021 01:31 PM   Modules accepted: Orders

## 2021-10-08 NOTE — Progress Notes (Signed)
I fell.  She feel and hurt her right lower leg again and her right hip.  She is using one crutch now.  The prednisone helped until she fell.  She is not improving.  The pain medicine works.  She is tired of hurting.  Her right calf is tender in the mid portion and slightly superiorly.  No defect is felt.  Homan's negative. ROM of knee and ankle full.  NV intact.  Encounter Diagnosis  Name Primary?   Pain in right lower leg Yes   I will renew pain medicine.  She needs MRI of the calf but will have to wait another three weeks for insurance to cover it.  Return in three weeks.  Rx given for cane.  Call if any problem.  Precautions discussed.  Electronically Signed Darreld Mclean, MD 9/14/202310:00 AM

## 2021-10-13 ENCOUNTER — Emergency Department (HOSPITAL_COMMUNITY): Payer: Medicaid Other

## 2021-10-13 ENCOUNTER — Encounter (HOSPITAL_COMMUNITY): Payer: Self-pay

## 2021-10-13 ENCOUNTER — Emergency Department (HOSPITAL_COMMUNITY)
Admission: EM | Admit: 2021-10-13 | Discharge: 2021-10-13 | Disposition: A | Discharge status: Home / Self Care | Payer: Medicaid Other | Attending: Emergency Medicine | Admitting: Emergency Medicine

## 2021-10-13 ENCOUNTER — Other Ambulatory Visit: Payer: Self-pay

## 2021-10-13 DIAGNOSIS — Z79899 Other long term (current) drug therapy: Secondary | ICD-10-CM | POA: Insufficient documentation

## 2021-10-13 DIAGNOSIS — R079 Chest pain, unspecified: Secondary | ICD-10-CM | POA: Insufficient documentation

## 2021-10-13 DIAGNOSIS — H7292 Unspecified perforation of tympanic membrane, left ear: Secondary | ICD-10-CM | POA: Insufficient documentation

## 2021-10-13 DIAGNOSIS — T7491XA Unspecified adult maltreatment, confirmed, initial encounter: Secondary | ICD-10-CM

## 2021-10-13 DIAGNOSIS — R109 Unspecified abdominal pain: Secondary | ICD-10-CM | POA: Diagnosis not present

## 2021-10-13 DIAGNOSIS — Z6911 Encounter for mental health services for victim of spousal or partner abuse: Secondary | ICD-10-CM | POA: Diagnosis present

## 2021-10-13 DIAGNOSIS — R519 Headache, unspecified: Secondary | ICD-10-CM | POA: Diagnosis not present

## 2021-10-13 DIAGNOSIS — Z91040 Latex allergy status: Secondary | ICD-10-CM | POA: Insufficient documentation

## 2021-10-13 DIAGNOSIS — T7621XA Adult sexual abuse, suspected, initial encounter: Secondary | ICD-10-CM

## 2021-10-13 LAB — CBC WITH DIFFERENTIAL/PLATELET
Abs Immature Granulocytes: 0.03 10*3/uL (ref 0.00–0.07)
Basophils Absolute: 0 10*3/uL (ref 0.0–0.1)
Basophils Relative: 0 %
Eosinophils Absolute: 0 10*3/uL (ref 0.0–0.5)
Eosinophils Relative: 0 %
HCT: 40.8 % (ref 36.0–46.0)
Hemoglobin: 13.8 g/dL (ref 12.0–15.0)
Immature Granulocytes: 0 %
Lymphocytes Relative: 26 %
Lymphs Abs: 2.9 10*3/uL (ref 0.7–4.0)
MCH: 31.7 pg (ref 26.0–34.0)
MCHC: 33.8 g/dL (ref 30.0–36.0)
MCV: 93.8 fL (ref 80.0–100.0)
Monocytes Absolute: 0.7 10*3/uL (ref 0.1–1.0)
Monocytes Relative: 6 %
Neutro Abs: 7.8 10*3/uL — ABNORMAL HIGH (ref 1.7–7.7)
Neutrophils Relative %: 68 %
Platelets: 239 10*3/uL (ref 150–400)
RBC: 4.35 MIL/uL (ref 3.87–5.11)
RDW: 13.4 % (ref 11.5–15.5)
WBC: 11.4 10*3/uL — ABNORMAL HIGH (ref 4.0–10.5)
nRBC: 0 % (ref 0.0–0.2)

## 2021-10-13 LAB — COMPREHENSIVE METABOLIC PANEL
ALT: 13 U/L (ref 0–44)
AST: 18 U/L (ref 15–41)
Albumin: 3.6 g/dL (ref 3.5–5.0)
Alkaline Phosphatase: 44 U/L (ref 38–126)
Anion gap: 13 (ref 5–15)
BUN: 10 mg/dL (ref 6–20)
CO2: 24 mmol/L (ref 22–32)
Calcium: 8.9 mg/dL (ref 8.9–10.3)
Chloride: 103 mmol/L (ref 98–111)
Creatinine, Ser: 1.1 mg/dL — ABNORMAL HIGH (ref 0.44–1.00)
GFR, Estimated: >60 mL/min (ref >60)
Glucose, Bld: 80 mg/dL (ref 70–99)
Potassium: 3.2 mmol/L — ABNORMAL LOW (ref 3.5–5.1)
Sodium: 140 mmol/L (ref 135–145)
Total Bilirubin: 1 mg/dL (ref 0.3–1.2)
Total Protein: 6.5 g/dL (ref 6.5–8.1)

## 2021-10-13 LAB — URINALYSIS, ROUTINE W REFLEX MICROSCOPIC
Bilirubin Urine: NEGATIVE
Glucose, UA: NEGATIVE mg/dL
Hgb urine dipstick: NEGATIVE
Ketones, ur: NEGATIVE mg/dL
Leukocytes,Ua: NEGATIVE
Nitrite: NEGATIVE
Protein, ur: NEGATIVE mg/dL
Specific Gravity, Urine: 1.029 (ref 1.005–1.030)
pH: 6 (ref 5.0–8.0)

## 2021-10-13 LAB — I-STAT CHEM 8, ED
BUN: 9 mg/dL (ref 6–20)
Calcium, Ion: 1.01 mmol/L — ABNORMAL LOW (ref 1.15–1.40)
Chloride: 105 mmol/L (ref 98–111)
Creatinine, Ser: 1.2 mg/dL — ABNORMAL HIGH (ref 0.44–1.00)
Glucose, Bld: 80 mg/dL (ref 70–99)
HCT: 44 % (ref 36.0–46.0)
Hemoglobin: 15 g/dL (ref 12.0–15.0)
Potassium: 3.7 mmol/L (ref 3.5–5.1)
Sodium: 139 mmol/L (ref 135–145)
TCO2: 23 mmol/L (ref 22–32)

## 2021-10-13 LAB — I-STAT BETA HCG BLOOD, ED (MC, WL, AP ONLY): I-stat hCG, quantitative: <5 m[IU]/mL (ref <5)

## 2021-10-13 LAB — HEPATITIS B SURFACE ANTIGEN: Hepatitis B Surface Ag: NONREACTIVE

## 2021-10-13 LAB — RAPID HIV SCREEN (HIV 1/2 AB+AG)
HIV 1/2 Antibodies: NONREACTIVE
HIV-1 P24 Antigen - HIV24: NONREACTIVE

## 2021-10-13 LAB — HEPATITIS C ANTIBODY: HCV Ab: NONREACTIVE

## 2021-10-13 MED ORDER — ACETAMINOPHEN 500 MG PO TABS
1000.0000 mg | ORAL_TABLET | Freq: Once | ORAL | Status: AC
  Administered 2021-10-13: 1000 mg via ORAL
  Filled 2021-10-13: qty 2

## 2021-10-13 MED ORDER — ONDANSETRON HCL 4 MG/2ML IJ SOLN: 4.0000 mg | Freq: Four times a day (QID) | INTRAMUSCULAR | Status: DC | PRN

## 2021-10-13 MED ORDER — LORAZEPAM 1 MG PO TABS
1.0000 mg | ORAL_TABLET | Freq: Once | ORAL | Status: AC
  Administered 2021-10-13: 1 mg via ORAL
  Filled 2021-10-13: qty 1

## 2021-10-13 MED ORDER — TETRACAINE HCL 0.5 % OP SOLN
2.0000 [drp] | Freq: Once | OPHTHALMIC | Status: AC
  Administered 2021-10-13: 2 [drp] via OPHTHALMIC
  Filled 2021-10-13: qty 4

## 2021-10-13 MED ORDER — IOHEXOL 350 MG/ML SOLN
100.0000 mL | Freq: Once | INTRAVENOUS | Status: AC | PRN
  Administered 2021-10-13: 100 mL via INTRAVENOUS

## 2021-10-13 MED ORDER — HYDROMORPHONE HCL 1 MG/ML IJ SOLN
0.5000 mg | Freq: Once | INTRAMUSCULAR | Status: AC
  Administered 2021-10-13: 0.5 mg via INTRAVENOUS
  Filled 2021-10-13: qty 0.5

## 2021-10-13 MED ORDER — FLUORESCEIN SODIUM 1 MG OP STRP
1.0000 | ORAL_STRIP | Freq: Once | OPHTHALMIC | Status: AC
  Administered 2021-10-13: 1 via OPHTHALMIC
  Filled 2021-10-13: qty 1

## 2021-10-13 MED ORDER — FENTANYL CITRATE PF 50 MCG/ML IJ SOSY
25.0000 ug | PREFILLED_SYRINGE | Freq: Once | INTRAMUSCULAR | Status: AC
  Administered 2021-10-13: 25 ug via INTRAVENOUS
  Filled 2021-10-13: qty 1

## 2021-10-13 MED ORDER — OFLOXACIN 0.3 % OP SOLN: 1.0000 [drp] | Freq: Four times a day (QID) | OPHTHALMIC | 0 refills | Status: DC

## 2021-10-13 MED ORDER — HYDROMORPHONE HCL 1 MG/ML IJ SOLN: 0.5000 mg | INTRAMUSCULAR | Status: DC | PRN

## 2021-10-13 MED ORDER — LACTATED RINGERS IV BOLUS
1000.0000 mL | Freq: Once | INTRAVENOUS | Status: AC
  Administered 2021-10-13: 1000 mL via INTRAVENOUS
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
## 2021-10-13 NOTE — Discharge Instructions (Addendum)
Do not get any water in your ear, you will need to see the ear doctor within 1 week Use the topical drops for your eye You may return at any time should you change your mind Thankfully your x-rays do not show any significant injuries to your internal organs or bones

## 2021-10-13 NOTE — ED Notes (Signed)
SANE at bedside

## 2021-10-13 NOTE — ED Provider Notes (Signed)
Patient is refusing SANE nurse exam at this time, the patient wants to be discharged.  She will be given ofloxacin for possible corneal abrasion and referred to ENT.  She has no signs of significant internal injuries on imaging, she is otherwise stable for discharge  The patient is aware that she can return at any time should she change her mind   Noemi Chapel, MD 10/13/21 1655

## 2021-10-14 LAB — RPR: RPR Ser Ql: NONREACTIVE
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
## 2021-10-19 ENCOUNTER — Telehealth: Payer: Self-pay

## 2021-10-19 NOTE — Telephone Encounter (Signed)
Oxycodone-Acetaminophen  Qty 25 Tablets   PATIENT USES [facility] ON [address]

## 2021-10-20 MED ORDER — OXYCODONE-ACETAMINOPHEN 5-325 MG PO TABS: ORAL_TABLET | ORAL | 0 refills | Status: DC

## 2021-10-27 ENCOUNTER — Telehealth: Payer: Self-pay

## 2021-10-27 MED ORDER — OXYCODONE-ACETAMINOPHEN 5-325 MG PO TABS: ORAL_TABLET | ORAL | 0 refills | Status: DC

## 2021-10-27 NOTE — Telephone Encounter (Signed)
Oxycodone- Acetaminophen 5/325 MG  Qty 25 Tablets

## 2021-10-27 NOTE — Telephone Encounter (Signed)
PATIENT USES [address]

## 2021-10-28 ENCOUNTER — Telehealth: Payer: Self-pay | Admitting: Orthopaedic Surgery
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
## 2021-10-29 ENCOUNTER — Ambulatory Visit: Payer: Medicaid Other | Admitting: Orthopaedic Surgery

## 2021-11-03 ENCOUNTER — Other Ambulatory Visit: Payer: Self-pay | Admitting: Orthopaedic Surgery

## 2021-11-03 MED ORDER — OXYCODONE-ACETAMINOPHEN 5-325 MG PO TABS: ORAL_TABLET | ORAL | 0 refills | Status: DC

## 2021-11-03 NOTE — Telephone Encounter (Signed)
Patient called in stating she needs her pain medicine refilled    oxyCODONE-acetaminophen (PERCOCET/ROXICET) 5-325 MG tablet  Pharmacy: [facility] on [address]

## 2021-11-10 ENCOUNTER — Telehealth: Payer: Self-pay | Admitting: Orthopaedic Surgery

## 2021-11-10 ENCOUNTER — Ambulatory Visit: Payer: Medicaid Other | Admitting: Orthopaedic Surgery
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
## 2021-11-11 NOTE — Telephone Encounter (Signed)
Noted! Thank you

## 2021-11-12 ENCOUNTER — Ambulatory Visit (INDEPENDENT_AMBULATORY_CARE_PROVIDER_SITE_OTHER): Payer: Medicaid Other | Admitting: Orthopaedic Surgery

## 2021-11-12 ENCOUNTER — Encounter: Payer: Self-pay | Admitting: Orthopaedic Surgery

## 2021-11-12 ENCOUNTER — Ambulatory Visit: Payer: Medicaid Other

## 2021-11-12 DIAGNOSIS — M25561 Pain in right knee: Secondary | ICD-10-CM | POA: Diagnosis not present

## 2021-11-12 DIAGNOSIS — M79661 Pain in right lower leg: Secondary | ICD-10-CM

## 2021-11-12 DIAGNOSIS — S79922A Unspecified injury of left thigh, initial encounter: Secondary | ICD-10-CM

## 2021-11-12 DIAGNOSIS — G8929 Other chronic pain: Secondary | ICD-10-CM

## 2021-11-12 MED ORDER — OXYCODONE-ACETAMINOPHEN 5-325 MG PO TABS: ORAL_TABLET | ORAL | 0 refills | Status: DC

## 2021-11-12 NOTE — Patient Instructions (Signed)
Please schedule appt for patient to have just xrays done BEFORE her appt for recheck please
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
## 2021-11-17 ENCOUNTER — Telehealth: Payer: Self-pay

## 2021-11-17 NOTE — Telephone Encounter (Signed)
Oxycodone-Acetaminophen 5/325 MG  Qty 25 Tablets   PATIENT [address]illa [address] DR

## 2021-11-19 ENCOUNTER — Encounter: Payer: Self-pay | Admitting: Orthopaedic Surgery

## 2021-11-19 ENCOUNTER — Ambulatory Visit (INDEPENDENT_AMBULATORY_CARE_PROVIDER_SITE_OTHER): Payer: Medicaid Other | Admitting: Orthopaedic Surgery

## 2021-11-19 ENCOUNTER — Telehealth: Payer: Self-pay | Admitting: Orthopaedic Surgery

## 2021-11-19 DIAGNOSIS — S79922A Unspecified injury of left thigh, initial encounter: Secondary | ICD-10-CM | POA: Diagnosis not present

## 2021-11-19 DIAGNOSIS — M79661 Pain in right lower leg: Secondary | ICD-10-CM | POA: Diagnosis not present

## 2021-11-19 MED ORDER — OXYCODONE-ACETAMINOPHEN 5-325 MG PO TABS: ORAL_TABLET | ORAL | 0 refills | Status: DC

## 2021-11-19 NOTE — Telephone Encounter (Signed)
Due to transportation, is it possible to do patient's appointment today, 11/19/21, by telephone?  Please advise.
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
## 2021-11-19 NOTE — Telephone Encounter (Signed)
Per Dr Brooke Bonito response to [place], it is too early for refill. Patient aware of appointment Thursday, 11/19/21, at which time she can discuss refill request with Dr Luna Glasgow.

## 2021-11-19 NOTE — Telephone Encounter (Signed)
Patient aware to expect phone call / virtual visit per Dr Brooke Bonito response.

## 2021-11-26 ENCOUNTER — Ambulatory Visit: Payer: Medicaid Other | Admitting: Orthopaedic Surgery

## 2021-11-26 ENCOUNTER — Ambulatory Visit (INDEPENDENT_AMBULATORY_CARE_PROVIDER_SITE_OTHER): Payer: Medicaid Other

## 2021-11-26 ENCOUNTER — Encounter: Payer: Self-pay | Admitting: Orthopaedic Surgery

## 2021-11-26 VITALS — Ht 61.0 in | Wt 193.0 lb

## 2021-11-26 DIAGNOSIS — M79661 Pain in right lower leg: Secondary | ICD-10-CM

## 2021-11-26 DIAGNOSIS — M25561 Pain in right knee: Secondary | ICD-10-CM | POA: Diagnosis not present

## 2021-11-26 DIAGNOSIS — S79922A Unspecified injury of left thigh, initial encounter: Secondary | ICD-10-CM

## 2021-11-26 DIAGNOSIS — G8929 Other chronic pain: Secondary | ICD-10-CM

## 2021-11-26 MED ORDER — OXYCODONE-ACETAMINOPHEN 5-325 MG PO TABS: ORAL_TABLET | ORAL | 0 refills | Status: DC
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
## 2021-11-26 NOTE — Patient Instructions (Addendum)
Physical therapy has been ordered for you at [facility]. They should call you to schedule, [phone] is the phone number to call, if you want to call to schedule.    For your first eval appt, they only have a certain amount of those slots available in the morning and afternoon. You will need to take one of those slots they offer. After that, they will be able to work around your schedule more.  OOW note until after next visit.

## 2021-12-02 ENCOUNTER — Telehealth: Payer: Self-pay | Admitting: Orthopaedic Surgery

## 2021-12-02 NOTE — Telephone Encounter (Signed)
Patient called requesting that a refill on Percocet 5mg , quantity 25 be sent to [facility] on [address]

## 2021-12-03 MED ORDER — OXYCODONE-ACETAMINOPHEN 5-325 MG PO TABS: ORAL_TABLET | ORAL | 0 refills | Status: DC

## 2021-12-09 ENCOUNTER — Telehealth: Payer: Self-pay

## 2021-12-09 MED ORDER — OXYCODONE-ACETAMINOPHEN 5-325 MG PO TABS: ORAL_TABLET | ORAL | 0 refills | Status: DC

## 2021-12-09 NOTE — Telephone Encounter (Signed)
Oxycodone-Acetaminophen 5/325 MG  Qty 25 Tablets  PATIENT USES [facility] ON [address]

## 2021-12-09 NOTE — Telephone Encounter (Signed)
Message sent to provider. Pharmacy changed to [facility] per message from FO

## 2021-12-10 ENCOUNTER — Telehealth: Payer: Self-pay

## 2021-12-10 NOTE — Telephone Encounter (Signed)
Error

## 2021-12-15 ENCOUNTER — Ambulatory Visit: Payer: Medicaid Other | Admitting: Orthopaedic Surgery

## 2021-12-15 ENCOUNTER — Telehealth: Payer: Self-pay

## 2021-12-15 MED ORDER — OXYCODONE-ACETAMINOPHEN 5-325 MG PO TABS: ORAL_TABLET | ORAL | 0 refills | Status: DC

## 2021-12-15 NOTE — Telephone Encounter (Signed)
Oxycodone-Acetaminophen 5/325 MG Qty  PATIENT USES [facility] ON [address]  Patient wanted to find out why her medication was changed from 25 tablets to 20 tablets.

## 2021-12-15 NOTE — Telephone Encounter (Signed)
Spoke with patient. Explained to her that the STOP law prevents provider from prescribing original amount of opioid medication after a certain number of times she gets it. She states that her missed appointment today has been rescheduled. She verbalized understanding of why the amount of tablets she is getting is decreased.

## 2021-12-23 ENCOUNTER — Ambulatory Visit: Payer: Medicaid Other | Admitting: Orthopaedic Surgery

## 2021-12-23 ENCOUNTER — Ambulatory Visit (HOSPITAL_COMMUNITY): Payer: Medicaid Other | Attending: Physical Therapy | Admitting: Physical Therapy
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
## 2021-12-24 ENCOUNTER — Ambulatory Visit: Payer: Medicaid Other | Admitting: Orthopaedic Surgery

## 2021-12-29 ENCOUNTER — Encounter: Payer: Self-pay | Admitting: Orthopaedic Surgery

## 2021-12-29 ENCOUNTER — Ambulatory Visit (INDEPENDENT_AMBULATORY_CARE_PROVIDER_SITE_OTHER): Payer: Medicaid Other | Admitting: Orthopaedic Surgery

## 2021-12-29 ENCOUNTER — Ambulatory Visit: Payer: Medicaid Other | Admitting: Orthopaedic Surgery

## 2021-12-29 ENCOUNTER — Ambulatory Visit (INDEPENDENT_AMBULATORY_CARE_PROVIDER_SITE_OTHER): Payer: Medicaid Other

## 2021-12-29 VITALS — BP 146/80

## 2021-12-29 DIAGNOSIS — G8929 Other chronic pain: Secondary | ICD-10-CM

## 2021-12-29 DIAGNOSIS — M25562 Pain in left knee: Secondary | ICD-10-CM | POA: Diagnosis not present

## 2021-12-29 MED ORDER — OXYCODONE-ACETAMINOPHEN 5-325 MG PO TABS: ORAL_TABLET | ORAL | 0 refills | Status: DC

## 2021-12-29 NOTE — Patient Instructions (Signed)
Central Scheduling [phone]  While we are working on your approval please go ahead and call to schedule your appointment to be done within one week. If you can not get an appointment at [facility] within the next week, ask if they have something sooner at [facility] or [facility] if you are able to go to [facility] to have the imaging done.  AFTER you have made your imaging appointment, please call our office back at [phone] to schedule an appointment to review your results.  CURRENTLY [employer] ONLY AUTHORIZES IMAGING TO BE PERFORMED AT [facility]. THEY WILL NOT APPROVE ANY OTHER FACILITY  [facility] Address: [address] [facility] [address], [place], [place] [address] Phone: [phone]   [facility] [address] [place], [place] [address] Phone: [phone]  

## 2021-12-29 NOTE — Progress Notes (Signed)
She has more pain of the left knee now with swelling, popping and giving way.   She is not improving.  Left knee has slight effusion, crepitus, ROM 0 to 105, limp left, uses cane and positive medial [place].  No distal edema.  NV intact.  X-rays were done of the left knee, reported separately.  Encounter Diagnosis  Name Primary?   Chronic pain of left knee Yes   I will get MRI of the left knee.  I have reviewed the [place] Controlled Substance Reporting System web site prior to prescribing narcotic medicine for this patient.  Return in two weeks.  Call if any problem.  Precautions discussed.  Electronically Signed Darreld Mclean, MD 12/5/20233:37 PM

## 2022-01-04 ENCOUNTER — Telehealth: Payer: Self-pay | Admitting: Orthopaedic Surgery

## 2022-01-04 MED ORDER — OXYCODONE-ACETAMINOPHEN 5-325 MG PO TABS: ORAL_TABLET | ORAL | 0 refills | Status: DC

## 2022-01-04 NOTE — Telephone Encounter (Signed)
Patient is requesting a refill on her Oxycodone 5-325, 20 quantity, to be called to [facility] on [address]  She has not scheduled her MRI yet.  Pt's phone # [phone]

## 2022-01-05 ENCOUNTER — Telehealth: Payer: Self-pay | Admitting: Radiology

## 2022-01-05 MED ORDER — OXYCODONE-ACETAMINOPHEN 5-325 MG PO TABS: ORAL_TABLET | ORAL | 0 refills | Status: DC

## 2022-01-05 NOTE — Telephone Encounter (Signed)
In order for Medicaid to cover Rx has to say 5 day limit instead of 7 day limit   I have pended to resend says 5 day limit instead of 7 day limit will you resend with this updated in sig, pharmacy can not fill until this is changed, thanks

## 2022-01-05 NOTE — Telephone Encounter (Signed)
Patient called back and states her MRI has now been scheduled for 01/26/22 at 12:00   I have scheduled her for 02/02/22 at 1:50 to review results with patient.   She wants to know when is she going to have her pain medicine is going to be filled???    She said she is having to pay for her RX and she doesn't know why.   Please call her back at [phone]

## 2022-01-05 NOTE — Telephone Encounter (Signed)
Patient called wanting to make an appt to review MRI results and she wants her pain medicine called in too.  I advised her I do not see where it is scheduled is it within [facility] and she said yes at [facility].  I told her she will need to call them back.   She is going to call them then call us back.

## 2022-01-12 ENCOUNTER — Telehealth: Payer: Self-pay | Admitting: Orthopaedic Surgery

## 2022-01-12 MED ORDER — OXYCODONE-ACETAMINOPHEN 5-325 MG PO TABS: ORAL_TABLET | ORAL | 0 refills | Status: DC

## 2022-01-12 NOTE — Telephone Encounter (Signed)
Sent to provider 

## 2022-01-12 NOTE — Telephone Encounter (Signed)
Patient called, requesting a refill on Oxycodone 5-325mg  to be called to [facility] on [address]  She stated she is in more pain than usual.  Pt's # [phone]

## 2022-01-20 ENCOUNTER — Other Ambulatory Visit: Payer: Self-pay | Admitting: Orthopaedic Surgery

## 2022-01-20 MED ORDER — OXYCODONE-ACETAMINOPHEN 5-325 MG PO TABS: ORAL_TABLET | ORAL | 0 refills | Status: DC

## 2022-01-20 NOTE — Telephone Encounter (Signed)
Patient called, requesting a refill on Oxycodone 5-325, 16 quantity, 1 every 6 hours PRN to be sent to [facility] on [address].  Pt's # [phone]

## 2022-01-26 ENCOUNTER — Ambulatory Visit (HOSPITAL_COMMUNITY)
Admission: RE | Admit: 2022-01-26 | Discharge: 2022-01-26 | Disposition: A | Discharge status: Home / Self Care | Payer: Medicaid Other | Source: Ambulatory Visit | Attending: Orthopaedic Surgery | Admitting: Orthopaedic Surgery

## 2022-01-26 ENCOUNTER — Telehealth: Payer: Self-pay | Admitting: Orthopaedic Surgery

## 2022-01-26 DIAGNOSIS — G8929 Other chronic pain: Secondary | ICD-10-CM

## 2022-01-26 MED ORDER — OXYCODONE-ACETAMINOPHEN 5-325 MG PO TABS: ORAL_TABLET | ORAL | 0 refills | Status: DC

## 2022-01-26 NOTE — Telephone Encounter (Signed)
Patient called requesting a refill on her Oxycodone 5-325 to be sent to [facility] on [address]  Pt's # [phone]

## 2022-01-28 ENCOUNTER — Telehealth: Payer: Self-pay

## 2022-01-28 NOTE — Telephone Encounter (Signed)
Patient stated that she went for MRI and had to reschedule because she is claustrophobic she needs something for anxiety sent in for her. Her MRI is scheduled for 02/19/22 @ 3:30 pm now.  PATIENT USES [facility] ON [address]

## 2022-02-02 ENCOUNTER — Ambulatory Visit: Payer: Medicaid Other | Admitting: Orthopaedic Surgery

## 2022-02-02 ENCOUNTER — Telehealth: Payer: Self-pay

## 2022-02-02 ENCOUNTER — Telehealth: Payer: Self-pay | Admitting: Orthopaedic Surgery

## 2022-02-02 ENCOUNTER — Ambulatory Visit (HOSPITAL_COMMUNITY): Payer: Medicaid Other | Admitting: Physical Therapy

## 2022-02-02 DIAGNOSIS — F40240 Claustrophobia: Secondary | ICD-10-CM

## 2022-02-02 MED ORDER — DIAZEPAM 10 MG PO TABS: 10.0000 mg | ORAL_TABLET | Freq: Once | ORAL | 0 refills | Status: DC

## 2022-02-02 MED ORDER — OXYCODONE-ACETAMINOPHEN 5-325 MG PO TABS: ORAL_TABLET | ORAL | 0 refills | Status: DC

## 2022-02-02 NOTE — Telephone Encounter (Signed)
Patient called, requesting a refill on Oxycodone to be sent to [facility] on [address]  Pt's # [phone]

## 2022-02-02 NOTE — Telephone Encounter (Signed)
Sent!

## 2022-02-09 ENCOUNTER — Telehealth: Payer: Self-pay | Admitting: Orthopaedic Surgery

## 2022-02-09 DIAGNOSIS — G8929 Other chronic pain: Secondary | ICD-10-CM

## 2022-02-09 MED ORDER — OXYCODONE-ACETAMINOPHEN 5-325 MG PO TABS: ORAL_TABLET | ORAL | 0 refills | Status: DC

## 2022-02-09 NOTE — Telephone Encounter (Signed)
Patient called, requesting a refill on her Oxycodone 5-325 to be sent to [facility] on [address].  Pt's # [phone]

## 2022-02-09 NOTE — Telephone Encounter (Signed)
SENT TO PROVIDER  

## 2022-02-16 ENCOUNTER — Telehealth: Payer: Self-pay | Admitting: Orthopaedic Surgery

## 2022-02-16 DIAGNOSIS — G8929 Other chronic pain: Secondary | ICD-10-CM

## 2022-02-16 MED ORDER — OXYCODONE-ACETAMINOPHEN 5-325 MG PO TABS: ORAL_TABLET | ORAL | 0 refills | Status: DC

## 2022-02-16 NOTE — Telephone Encounter (Signed)
Patient called, stated that she has lost her script for the claustrophobia for the MRI, she would like that sent in the day before her MRI so she doesn't lose it.  She is also requesting a refill on Oxycodone 5-325 to be sent to [facility] on [address]  Marisa Sprinkles # [phone]

## 2022-02-16 NOTE — Telephone Encounter (Signed)
SEE MESSAGE BELOW FROM PATIENT REGARDING DIAZEPAM PRESCRIPTION. SHE STATES SHE LOST IT.

## 2022-02-17 NOTE — Telephone Encounter (Signed)
Spoke with patient. Advised her that provider did not send in another dose of diazepam for her to have her mri done. It was a one time dose. I did let her know that they would only go up to her knee in the mri machine. She verbalized understanding.

## 2022-02-17 NOTE — Telephone Encounter (Signed)
Patient called, lvm stating that she is checking on her medication for the MRI tomorrow.  She would like a call back.  Pt's # [phone]

## 2022-02-18 ENCOUNTER — Ambulatory Visit (HOSPITAL_COMMUNITY): Payer: Medicaid Other

## 2022-02-19 ENCOUNTER — Ambulatory Visit (HOSPITAL_COMMUNITY): Admission: RE | Admit: 2022-02-19 | Payer: Medicaid Other | Source: Ambulatory Visit

## 2022-02-19 ENCOUNTER — Encounter (HOSPITAL_COMMUNITY): Payer: Self-pay

## 2022-02-23 ENCOUNTER — Ambulatory Visit: Payer: Medicaid Other | Admitting: Orthopaedic Surgery

## 2022-02-23 ENCOUNTER — Ambulatory Visit (HOSPITAL_COMMUNITY)
Admission: RE | Admit: 2022-02-23 | Discharge: 2022-02-23 | Disposition: A | Discharge status: Home / Self Care | Payer: Medicaid Other | Source: Ambulatory Visit | Attending: Orthopaedic Surgery | Admitting: Orthopaedic Surgery

## 2022-02-23 ENCOUNTER — Telehealth: Payer: Self-pay | Admitting: Orthopaedic Surgery

## 2022-02-23 DIAGNOSIS — M25562 Pain in left knee: Secondary | ICD-10-CM | POA: Insufficient documentation

## 2022-02-23 DIAGNOSIS — G8929 Other chronic pain: Secondary | ICD-10-CM | POA: Diagnosis present

## 2022-02-23 MED ORDER — OXYCODONE-ACETAMINOPHEN 5-325 MG PO TABS: ORAL_TABLET | ORAL | 0 refills | Status: DC

## 2022-02-23 NOTE — Telephone Encounter (Signed)
Pt called, requesting a refill on her Hydrocodone 12.5 to be sent to [facility] on [address]

## 2022-02-23 NOTE — Telephone Encounter (Signed)
Patient called back and stated that Dr. Luna Glasgow sent her meds in, but the pharmacy stated there's an issue w/her insurance.  She would like a call back.  Pt's # [phone]

## 2022-02-24 NOTE — Telephone Encounter (Signed)
Spoke with pharmacy. Patient picked prescription up yesterday.

## 2022-03-01 ENCOUNTER — Ambulatory Visit: Payer: Medicaid Other | Admitting: Orthopedic Surgery

## 2022-03-02 ENCOUNTER — Ambulatory Visit: Payer: Medicaid Other | Admitting: Orthopaedic Surgery

## 2022-03-15 ENCOUNTER — Ambulatory Visit (HOSPITAL_COMMUNITY): Payer: Medicaid Other | Admitting: Physical Therapy
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
## 2022-03-25 ENCOUNTER — Encounter: Payer: Self-pay | Admitting: Radiology

## 2022-04-02 ENCOUNTER — Ambulatory Visit: Payer: Medicaid Other | Admitting: Orthopedic Surgery

## 2022-04-19 ENCOUNTER — Ambulatory Visit: Payer: Medicaid Other | Admitting: Orthopedic Surgery

## 2022-04-29 ENCOUNTER — Ambulatory Visit: Payer: Medicaid Other | Admitting: Orthopedic Surgery

## 2022-06-03 ENCOUNTER — Emergency Department (HOSPITAL_COMMUNITY)
Admission: EM | Admit: 2022-06-03 | Discharge: 2022-06-03 | Disposition: A | Discharge status: Home / Self Care | Payer: Medicaid Other | Attending: Emergency Medicine | Admitting: Emergency Medicine

## 2022-06-03 ENCOUNTER — Encounter (HOSPITAL_COMMUNITY): Payer: Self-pay | Admitting: Emergency Medicine

## 2022-06-03 ENCOUNTER — Other Ambulatory Visit: Payer: Self-pay

## 2022-06-03 DIAGNOSIS — Z91040 Latex allergy status: Secondary | ICD-10-CM | POA: Diagnosis not present

## 2022-06-03 DIAGNOSIS — N750 Cyst of Bartholin's gland: Secondary | ICD-10-CM | POA: Diagnosis present

## 2022-06-03 DIAGNOSIS — Z79899 Other long term (current) drug therapy: Secondary | ICD-10-CM | POA: Insufficient documentation

## 2022-06-03 MED ORDER — CEFIXIME 400 MG PO CAPS: 400.0000 mg | ORAL_CAPSULE | Freq: Every day | ORAL | 0 refills | Status: AC

## 2022-06-03 MED ORDER — LIDOCAINE-EPINEPHRINE (PF) 2 %-1:200000 IJ SOLN
20.0000 mL | Freq: Once | INTRAMUSCULAR | Status: AC
  Administered 2022-06-03: 20 mL
  Filled 2022-06-03: qty 20

## 2022-06-03 MED ORDER — CLINDAMYCIN HCL 150 MG PO CAPS
300.0000 mg | ORAL_CAPSULE | Freq: Once | ORAL | Status: AC
  Administered 2022-06-03: 300 mg via ORAL
  Filled 2022-06-03: qty 2

## 2022-06-03 MED ORDER — CEFIXIME 400 MG PO CAPS
400.0000 mg | ORAL_CAPSULE | Freq: Once | ORAL | Status: AC
  Administered 2022-06-03: 400 mg via ORAL
  Filled 2022-06-03: qty 1

## 2022-06-03 MED ORDER — HYDROCODONE-ACETAMINOPHEN 5-325 MG PO TABS
2.0000 | ORAL_TABLET | Freq: Once | ORAL | Status: AC
  Administered 2022-06-03: 2 via ORAL
  Filled 2022-06-03: qty 2

## 2022-06-03 MED ORDER — CLINDAMYCIN HCL 150 MG PO CAPS: 300.0000 mg | ORAL_CAPSULE | Freq: Four times a day (QID) | ORAL | 0 refills | Status: AC

## 2022-06-03 NOTE — ED Notes (Signed)
Pt states she is not driving home that she has a ride picking her up

## 2022-06-03 NOTE — Discharge Instructions (Addendum)
Evaluation today revealed that he likely did have a Bartholin gland cyst.  I&D procedure went very well.  Given the copious amounts of pustulant discharge, I am concerned that it was infected.  I have started you on Suprax and Cleocin which are antibiotics to cover for infection.  I sent this to your pharmacy.  Advise you follow-up with your OB/GYN medical provider.

## 2022-06-03 NOTE — ED Provider Notes (Signed)
[place] EMERGENCY DEPARTMENT AT [facility] Provider Note   CSN: [id number] Arrival date & time: 06/03/22  1727     History  Chief Complaint  Patient presents with   Cyst   HPI Priscilla Castillo is a 32 y.o. female with history of Bartholin gland cyst presenting for concern for Bartholin cyst.  States she noticed a cyst about a week ago.  Located in the left inferior aspect of the labia minora.  States its about a golf ball size.  States it is very painful and seems to be getting bigger.  States not oozing or bleeding.  Feels very similar to symptoms prior when she did have a Bartholin cyst that required drainage.  Denies fever or chills.  HPI     Home Medications Prior to Admission medications   Medication Sig Start Date End Date Taking? Authorizing Provider  cefixime (SUPRAX) 400 MG CAPS capsule Take 1 capsule (400 mg total) by mouth daily for 7 days. 06/03/22 06/10/22 Yes Gareth Eagle, PA-C  clindamycin (CLEOCIN) 150 MG capsule Take 2 capsules (300 mg total) by mouth 4 (four) times daily for 7 days. 06/03/22 06/10/22 Yes Gareth Eagle, PA-C  hydrochlorothiazide (HYDRODIURIL) 12.5 MG tablet Take 1 tablet (12.5 mg total) by mouth daily. 03/13/21   Wallis Bamberg, PA-C  ofloxacin (OCUFLOX) 0.3 % ophthalmic solution Place 1 drop into both eyes 4 (four) times daily. 10/13/21   Eber Hong, MD  oxyCODONE-acetaminophen (PERCOCET/ROXICET) 5-325 MG tablet One tablet every six hours.  Five day limit. 02/23/22   Darreld Mclean, MD      Allergies    Shellfish allergy, Banana, Latex, and Nsaids    Review of Systems   See HPI for pertinent positives  Physical Exam Updated Vital Signs BP 132/77 (BP Location: Right Arm)   Pulse 83   Temp 99.5 F (37.5 C) (Oral)   Resp 20   Ht 5\' 1"  (1.549 m)   Wt 90.4 kg   LMP 05/14/2022 (Approximate)   SpO2 100%   BMI 37.64 kg/m  Physical Exam Constitutional:      Appearance: Normal appearance.  HENT:     Head: Normocephalic.      Nose: Nose normal.  Eyes:     Conjunctiva/sclera: Conjunctivae normal.  Pulmonary:     Effort: Pulmonary effort is normal.  Genitourinary:   Neurological:     Mental Status: She is alert.  Psychiatric:        Mood and Affect: Mood normal.     ED Results / Procedures / Treatments   Labs (all labs ordered are listed, but only abnormal results are displayed) Labs Reviewed - No data to display  EKG None  Radiology No results found.  Procedures .Marland KitchenIncision and Drainage  Date/Time: 06/03/2022 9:04 PM  Performed by: Gareth Eagle, PA-C Authorized by: Gareth Eagle, PA-C   Consent:    Consent obtained:  Verbal   Consent given by:  Patient   Risks discussed:  Bleeding   Alternatives discussed:  No treatment Universal protocol:    Procedure explained and questions answered to patient or proxy's satisfaction: yes     Relevant documents present and verified: yes     Patient identity confirmed:  Verbally with patient and arm band Location:    Type:  Bartholin cyst   Size:  2 cm   Location:  Anogenital Pre-procedure details:    Skin preparation:  Povidone-iodine Sedation:    Sedation type:  None Anesthesia:  Anesthesia method:  Local infiltration   Local anesthetic:  Lidocaine 2% WITH epi Procedure type:    Complexity:  Simple Procedure details:    Ultrasound guidance: no     Incision types:  Stab incision   Incision depth:  Dermal   Wound management:  Probed and deloculated   Drainage:  Bloody and purulent   Drainage amount:  Copious   Wound treatment:  Wound left open   Packing materials:  None Post-procedure details:    Procedure completion:  Tolerated well, no immediate complications     Medications Ordered in ED Medications  cefixime (SUPRAX) capsule 400 mg (has no administration in time range)  clindamycin (CLEOCIN) capsule 300 mg (has no administration in time range)  HYDROcodone-acetaminophen (NORCO/VICODIN) 5-325 MG per tablet 2 tablet (2  tablets Oral Given 06/03/22 2019)  lidocaine-EPINEPHrine (XYLOCAINE W/EPI) 2 %-1:200000 (PF) injection 20 mL (20 mLs Infiltration Given by Other 06/03/22 2102)    ED Course/ Medical Decision Making/ A&P                             Medical Decision Making Risk Prescription drug management.   32 year old well-appearing female presenting with concern for Bartholin cyst.  Exam findings were consistent with likely Bartholin cyst.  Treated pain with Norco.  I&D procedure was successful.  Attempted to place Word catheter but unable to given small size of incision.  Given that we were able to drain such a large amount of fluid and there is no palpable fluctuance after incision, felt that placement of Word catheter was unnecessary at this time.  See procedure note above.  Given multiple recurrent Bartholin cyst with copious amounts of purulent discharge, there was concern for infection.  Started on Suprax and clindamycin.  Advised to follow-up with her OB/GYN medical provider.  Vital stable at discharge.        Final Clinical Impression(s) / ED Diagnoses Final diagnoses:  Bartholin cyst    Rx / DC Orders ED Discharge Orders          Ordered    cefixime (SUPRAX) 400 MG CAPS capsule  Daily        06/03/22 2102    clindamycin (CLEOCIN) 150 MG capsule  4 times daily        06/03/22 2102              Gareth Eagle, PA-C 06/03/22 2108    Benjiman Core, MD 06/04/22 0009

## 2022-06-03 NOTE — ED Triage Notes (Signed)
Pt c/o pain from Bartholin's cyst x 1 week with minimal drainage after hot bath or hot compresses at home. OTC meds at home w/o relief

## 2022-06-03 NOTE — ED Notes (Signed)
Assist provider with I&D  large amount of return  Pt placed peri pad on for dressing

## 2022-06-04 ENCOUNTER — Ambulatory Visit: Payer: Medicaid Other | Admitting: Obstetrics & Gynecology

## 2022-06-30 ENCOUNTER — Ambulatory Visit: Payer: Medicaid Other | Admitting: Adult Health

## 2022-07-01 ENCOUNTER — Encounter: Payer: Self-pay | Admitting: Obstetrics & Gynecology

## 2022-07-01 ENCOUNTER — Ambulatory Visit (INDEPENDENT_AMBULATORY_CARE_PROVIDER_SITE_OTHER): Payer: Medicaid Other | Admitting: Obstetrics & Gynecology

## 2022-07-01 VITALS — BP 141/101 | HR 87 | Ht 61.0 in | Wt 195.6 lb

## 2022-07-01 DIAGNOSIS — N750 Cyst of Bartholin's gland: Secondary | ICD-10-CM

## 2022-07-01 MED ORDER — SULFAMETHOXAZOLE-TRIMETHOPRIM 800-160 MG PO TABS
1.0000 | ORAL_TABLET | Freq: Two times a day (BID) | ORAL | 0 refills | Status: AC
Start: 2022-07-01 — End: 2022-07-08

## 2022-07-01 MED ORDER — OXYCODONE HCL 5 MG PO TABS
5.0000 mg | ORAL_TABLET | Freq: Four times a day (QID) | ORAL | 0 refills | Status: AC | PRN
Start: 2022-07-01 — End: 2022-07-04

## 2022-07-01 NOTE — Addendum Note (Signed)
Addended by: Annamarie Dawley on: 07/01/2022 01:26 PM   Modules accepted: Orders

## 2022-07-01 NOTE — Progress Notes (Addendum)
GYN VISIT Patient name: Priscilla Castillo MRN [id number]  Date of birth: [birth year] Chief Complaint:   Bartholin's Cyst  History of Present Illness:   Priscilla Castillo is a 32 y.o. [phone] female being seen today for the following concerns:  Bartholin's cyst: Last seen about a month ago in ER and drained.  She reports that it didn't work and it was back within a week or so.  She notes the area is back to being swollen to the point where it is occluding her vagina.  Notes considerable pain, no drainage.  Denies fever or chills.  Of note, she did not finish the antibiotic due to acid reflux.  Patient's last menstrual period was 05/14/2022 (approximate).    Review of Systems:   Pertinent items are noted in HPI Denies fever/chills, dizziness, headaches, visual disturbances, fatigue, shortness of breath, chest pain, abdominal pain, vomiting. Pertinent History Reviewed:   Past Surgical History:  Procedure Laterality Date   CESAREAN SECTION     x 2   CHOLECYSTECTOMY N/A 10/20/2020   Procedure: LAPAROSCOPIC CHOLECYSTECTOMY;  Surgeon: Franky Macho, MD;  Location: [facility];  Service: General;  Laterality: N/A;   WISDOM TOOTH EXTRACTION      Past Medical History:  Diagnosis Date   GERD (gastroesophageal reflux disease)    Hypertension    Pelvic inflammatory disease (PID)    Reviewed problem list, medications and allergies. Physical Assessment:   Vitals:   07/01/22 1203  BP: (!) 141/101  Pulse: 87  Weight: 195 lb 9.6 oz (88.7 kg)  Height: 5\' 1"  (1.549 m)  Body mass index is 36.96 kg/m.       Physical Examination:   General appearance: alert, well appearing, and in no distress  Psych: mood appropriate, normal affect  Skin: warm & dry   Cardiovascular: normal heart rate noted  Respiratory: normal respiratory effort, no distress  Pelvic: left Bartholin's cyst- ~ 4cm in size.  +tenderness to palpation- see below  Extremities: no edema   Chaperone: Faith Rogue      Bartholin Cyst I&D and Word Catheter Placement Enlarged abscess palpated in front of the hymenal ring around 5 or 7 o' clock.  Written informed consent was obtained.  Discussed complications and possible outcomes of procedure including recurrence of cyst, scarring leading to infection, bleeding, dyspareunia, distortion of anatomy.  Patient was examined in the dorsal lithotomy position and mass was identified.  The area was prepped with Iodine and draped in a sterile manner. 1% Lidocaine (3 ml) was then used to infiltrate area on top of the cyst, behind the hymenal ring.  A 7 mm incision was made using a sterile scapel. Upon palpation of the mass, a moderate amount of bloody purulent drainage was expressed through the incision. A hemostat was used to break up loculations, which resulted in expression of more bloody purulent drainage.  Samples of the drainage were sent for cultures. The open cyst was then copiously irrigated with normal saline, and a Word catheter was placed. 2 ml of sterile water was used to inflate the catheter balloon.  The end of the catheter was tucked into the vagina.  - Bactrim DS bid x 7 days for treatment - Recommended Sitz baths bid and Motrin and Percocet was given  prn pain.   She was told to call to be examined if she experiences increasing swelling, pain, vaginal discharge, or fever.    Assessment/Plan: -Recurrent infected Bartholin cyst -Discussed I&D today versus scheduled marsupialization -Risk benefits of  each option reviewed and encouraged patient to consider marsupialization as per patient this is at least the third time this has occurred -After much discussion patient desires I&D today see above procedure - she was instructed to wear a peripad to absorb discharge, and to maintain pelvic rest while the Word catheter is in place.  -The catheter will be left in place for at least two to four weeks to promote formation of an epithelialized tract for permanent drainage  of glandular secretions.  - She will need an appointment [facility]6 weeks from now for removal of word catheter.  -Elevated BP Suspect elevation due to pain and concerns about today's exam Patient left before repeat could be completed  Meds ordered this encounter  Medications   sulfamethoxazole-trimethoprim (BACTRIM DS) 800-160 MG tablet    Sig: Take 1 tablet by mouth 2 (two) times daily for 7 days.    Dispense:  14 tablet    Refill:  0   oxyCODONE (OXY IR/ROXICODONE) 5 MG immediate release tablet    Sig: Take 1 tablet (5 mg total) by mouth every 6 (six) hours as needed for up to 3 days for severe pain or breakthrough pain.    Dispense:  9 tablet    Refill:  0    Return in about 4 weeks (around 07/29/2022) for Barthol.   ADDENDUM: Pt returned to office due to heavy bleeding.  Difficulty with exam due to patient discomfort.  Options reviewed with patient including exam under anesthesia vs exam in office.  Also discussed that if bleeding persisted, she may need to go to OR for further evaluation.  After much discussion, pt agreeable to exam.  Silver nitrazine and pressure used to decrease bleeding.  Bleeding slowed, appeared hemostatic.  Advised pressure and ice.  Plan to return in 1wk  Myna Hidalgo, DO Attending Obstetrician & Gynecologist, [facility] for [employer], [facility]

## 2022-07-03 LAB — WOUND CULTURE: Organism ID, Bacteria: NONE SEEN

## 2022-07-05 LAB — WOUND CULTURE

## 2022-07-07 ENCOUNTER — Ambulatory Visit: Payer: Medicaid Other | Admitting: Obstetrics & Gynecology

## 2022-08-02 ENCOUNTER — Ambulatory Visit: Payer: Medicaid Other | Admitting: Obstetrics & Gynecology

## 2022-08-06 ENCOUNTER — Ambulatory Visit
Admission: EM | Admit: 2022-08-06 | Discharge: 2022-08-06 | Discharge status: Against Medical Advice | Payer: Medicaid Other

## 2022-08-11 ENCOUNTER — Ambulatory Visit: Payer: Medicaid Other | Admitting: Orthopaedic Surgery

## 2022-08-17 ENCOUNTER — Other Ambulatory Visit (INDEPENDENT_AMBULATORY_CARE_PROVIDER_SITE_OTHER): Payer: Medicaid Other

## 2022-08-17 ENCOUNTER — Ambulatory Visit (INDEPENDENT_AMBULATORY_CARE_PROVIDER_SITE_OTHER): Payer: Medicaid Other | Admitting: Orthopaedic Surgery

## 2022-08-17 ENCOUNTER — Encounter: Payer: Self-pay | Admitting: Orthopaedic Surgery

## 2022-08-17 ENCOUNTER — Other Ambulatory Visit: Payer: Self-pay | Admitting: Orthopaedic Surgery

## 2022-08-17 VITALS — BP 136/88 | HR 118 | Ht 61.0 in | Wt 197.6 lb

## 2022-08-17 DIAGNOSIS — M25562 Pain in left knee: Secondary | ICD-10-CM | POA: Diagnosis not present

## 2022-08-17 DIAGNOSIS — G8929 Other chronic pain: Secondary | ICD-10-CM

## 2022-08-17 MED ORDER — OXYCODONE-ACETAMINOPHEN 5-325 MG PO TABS: 1.0000 | ORAL_TABLET | Freq: Four times a day (QID) | ORAL | 0 refills | Status: DC | PRN

## 2022-08-17 NOTE — Patient Instructions (Signed)
While we are working on your approval for MRI please go ahead and call to schedule your appointment with [facility] within at least one (1) week.   Central Scheduling [phone]  

## 2022-08-17 NOTE — Telephone Encounter (Signed)
I called her  She said she called pharmacy its in progress. Said she can't get it for 7 days Please resend for 5 day supply / Medicaid

## 2022-08-17 NOTE — Telephone Encounter (Signed)
Dr. Sanjuan Dame pt - pt lvm, was seen today and stated that there's an issue w/her medication at the pharmacy.  She would like a call [phone]

## 2022-08-17 NOTE — Progress Notes (Signed)
My knee is hurting.  She has swelling and giving way of the left knee.  It has been doing that several months.  I had seen her in December.  She has no new trauma.  She has popping.  Nothing seems to help.  She has tried Tylenol and Advil and ice.  Left knee has slight effusion, crepitus, ROM 0 to 105, limp left, positive medial [place], NV intact, no distal edema.  X-rays were done of the left knee, reported separately.  Encounter Diagnosis  Name Primary?   Chronic pain of left knee Yes   I will get MRI of the left knee.  I am concerned about meniscus tear.  I have reviewed the [place] Controlled Substance Reporting System web site prior to prescribing narcotic medicine for this patient.  Return in three weeks.  Call if any problem.  Precautions discussed.  Electronically Signed Darreld Mclean, MD 7/23/20242:36 PM;

## 2022-08-18 ENCOUNTER — Other Ambulatory Visit: Payer: Self-pay | Admitting: Orthopaedic Surgery

## 2022-08-18 ENCOUNTER — Telehealth: Payer: Self-pay | Admitting: Orthopaedic Surgery

## 2022-08-18 MED ORDER — OXYCODONE-ACETAMINOPHEN 5-325 MG PO TABS: 1.0000 | ORAL_TABLET | Freq: Four times a day (QID) | ORAL | 0 refills | Status: DC | PRN

## 2022-08-18 NOTE — Telephone Encounter (Signed)
Dr. Sanjuan Dame pt - Priscilla Castillo w/[facility] [phone] lvm stating that ALL [facility] are having a Corporate Issue with Dr. Sanjuan Dame profile.  She is requesting that maybe another provider prescribe it for her.  She doesn't anticipate it to be resolved sooner than 72 hours. She stated if you have any questions you can contact the pharmacist on duty today.

## 2022-08-18 NOTE — Telephone Encounter (Signed)
Dr Romeo Apple sent in meds for Dr Hilda Lias Since [facility] will not process Dr Jenetta Downer meds Patient called and states it needs a prior authorization I pulled up on cover my meds and the pharmacy has not started the process  To you FYI once they start the process I can do the prior authorization It will not be approved  We do not have a urine drug screen or a pain contract on file

## 2022-08-18 NOTE — Telephone Encounter (Signed)
Oxycodone-Acetaminophen 5/325 MG Qty 28 Tablets  Take 1 tablet by mouth every 6 (six) hours as needed for up to 7 days. Take one tablet every six hours as needed for pain. Seven day limit   PATIENT USES [facility] ON [address]

## 2022-08-18 NOTE — Telephone Encounter (Signed)
Dr Hilda Lias states you will renew his meds for patient since [facility] will not accept his prescriptions

## 2022-08-19 MED ORDER — OXYCODONE-ACETAMINOPHEN 5-325 MG PO TABS: 1.0000 | ORAL_TABLET | Freq: Four times a day (QID) | ORAL | 0 refills | Status: DC | PRN

## 2022-08-19 NOTE — Addendum Note (Signed)
Addended by: Earnstine Regal on: 08/19/2022 11:49 AM   Modules accepted: Orders

## 2022-08-22 ENCOUNTER — Encounter (HOSPITAL_COMMUNITY): Payer: Self-pay | Admitting: Emergency Medicine

## 2022-08-22 ENCOUNTER — Emergency Department (HOSPITAL_COMMUNITY)
Admission: EM | Admit: 2022-08-22 | Discharge: 2022-08-22 | Disposition: A | Discharge status: Home / Self Care | Payer: Medicaid Other | Attending: Emergency Medicine | Admitting: Emergency Medicine

## 2022-08-22 ENCOUNTER — Other Ambulatory Visit: Payer: Self-pay

## 2022-08-22 DIAGNOSIS — L03211 Cellulitis of face: Secondary | ICD-10-CM | POA: Insufficient documentation

## 2022-08-22 DIAGNOSIS — R220 Localized swelling, mass and lump, head: Secondary | ICD-10-CM | POA: Diagnosis present

## 2022-08-22 DIAGNOSIS — Z91040 Latex allergy status: Secondary | ICD-10-CM | POA: Diagnosis not present

## 2022-08-22 MED ORDER — ACETAMINOPHEN 325 MG PO TABS
650.0000 mg | ORAL_TABLET | Freq: Once | ORAL | Status: DC
  Filled 2022-08-22: qty 2

## 2022-08-22 MED ORDER — DOXYCYCLINE HYCLATE 100 MG PO CAPS: 100.0000 mg | ORAL_CAPSULE | Freq: Two times a day (BID) | ORAL | 0 refills | Status: AC

## 2022-08-22 MED ORDER — DOXYCYCLINE HYCLATE 100 MG PO TABS
100.0000 mg | ORAL_TABLET | Freq: Once | ORAL | Status: AC
  Administered 2022-08-22: 100 mg via ORAL
  Filled 2022-08-22: qty 1

## 2022-08-22 MED ORDER — HYDROCODONE-ACETAMINOPHEN 5-325 MG PO TABS
1.0000 | ORAL_TABLET | Freq: Once | ORAL | Status: AC
  Administered 2022-08-22: 1 via ORAL
  Filled 2022-08-22: qty 1

## 2022-08-22 NOTE — Discharge Instructions (Addendum)
Pleasure taking care of you today.  You have an infection of the skin of your lip.  At this time I do not feel you need this to be lanced but you should keep using warm compresses, avoid picking and squeezing the area, and make sure you take the full course of antibiotics.  Have your primary care doctor or urgent care recheck you in 2 days.  If you start having fever, increased redness or pain or any other new or worsening symptoms you need to come back to the ER right away.

## 2022-08-22 NOTE — ED Triage Notes (Addendum)
Pt has abscess on left side of face above lip. Pt states 2 days ago she thought it was a mosquitto bite that had a head and tried to pop it. Pt states yesterday used it was swollen and scabed over and nothing is coming out of it. Swelling noted to upper lip on left side. Pt states she thinks it was a spider bite.

## 2022-08-22 NOTE — ED Provider Notes (Signed)
[place] EMERGENCY DEPARTMENT AT [facility] Provider Note   CSN: [id number] Arrival date & time: 08/22/22  1726     History  Chief Complaint  Patient presents with   Insect Bite    Priscilla Castillo is a 32 y.o. female. She denies any significant PMH.  Presents to the ER today for left upper lip swelling and redness.  Started couple of days ago as a small bump on the skin with some mild pain, then got more red and swollen.  She has been picking and trying to squeeze it without getting anything to come out.  It is now scabbed over and more red and painful.  She states she had people told her it could be a brown recluse bite and she wanted to come to the ER for further evaluation.  She denies fever but states she felt hot and cold after a nap today.  No trouble swallowing or breathing.  She has no other complaints. HPI     Home Medications Prior to Admission medications   Medication Sig Start Date End Date Taking? Authorizing Provider  doxycycline (VIBRAMYCIN) 100 MG capsule Take 1 capsule (100 mg total) by mouth 2 (two) times daily for 7 days. 08/22/22 08/29/22 Yes Uva Runkel A, PA-C  hydrochlorothiazide (HYDRODIURIL) 12.5 MG tablet Take 1 tablet (12.5 mg total) by mouth daily. 03/13/21   Wallis Bamberg, PA-C  ofloxacin (OCUFLOX) 0.3 % ophthalmic solution Place 1 drop into both eyes 4 (four) times daily. Patient not taking: Reported on 07/01/2022 10/13/21   Eber Hong, MD  oxyCODONE-acetaminophen (PERCOCET/ROXICET) 5-325 MG tablet Take 1 tablet by mouth every 6 (six) hours as needed for up to 7 days. Take one tablet every six hours as needed for pain.  Seven day limit 08/19/22 08/26/22  Darreld Mclean, MD      Allergies    Shellfish allergy, Banana, Latex, and Nsaids    Review of Systems   Review of Systems  Physical Exam Updated Vital Signs BP (!) 148/99 (BP Location: Right Arm)   Pulse (!) 115   Temp 98.2 F (36.8 C)   Resp 19   Wt 89.8 kg   LMP 08/01/2022  (Approximate)   SpO2 100%   BMI 37.39 kg/m  Physical Exam Vitals and nursing note reviewed.  Constitutional:      General: She is not in acute distress.    Appearance: She is well-developed.  HENT:     Head: Normocephalic and atraumatic.  Eyes:     Conjunctiva/sclera: Conjunctivae normal.  Cardiovascular:     Rate and Rhythm: Normal rate and regular rhythm.     Heart sounds: No murmur heard.    Comments: Rate of 100 bpm taken manually by me at radial pulse Pulmonary:     Effort: Pulmonary effort is normal. No respiratory distress.     Breath sounds: Normal breath sounds.  Abdominal:     Palpations: Abdomen is soft.     Tenderness: There is no abdominal tenderness.  Musculoskeletal:        General: No swelling.     Cervical back: Neck supple.  Skin:    General: Skin is warm and dry.     Capillary Refill: Capillary refill takes less than 2 seconds.     Comments: Approximately 2 cm diameter area of erythema with central scabbing to left upper lip above the vermilion border.  There is some induration in this area.  No fluctuance.  No drainage.  Neurological:  General: No focal deficit present.     Mental Status: She is alert and oriented to person, place, and time.  Psychiatric:        Mood and Affect: Mood normal.     ED Results / Procedures / Treatments   Labs (all labs ordered are listed, but only abnormal results are displayed) Labs Reviewed - No data to display  EKG None  Radiology No results found.  Procedures Procedures    Medications Ordered in ED Medications  doxycycline (VIBRA-TABS) tablet 100 mg (has no administration in time range)  acetaminophen (TYLENOL) tablet 650 mg (has no administration in time range)    ED Course/ Medical Decision Making/ A&P                             Medical Decision Making This patient presents to the ED for concern of lip redness and swelling, this involves an extensive number of treatment options, and is a  complaint that carries with it a high risk of complications and morbidity.  The differential diagnosis includes abscess, contact dermatitis, cellulitis, other   Co morbidities that complicate the patient evaluation :   None   Additional history obtained:  Additional history obtained from EMR External records from outside source obtained and reviewed including notes     Problem List / ED Course / Critical interventions / Medication management  Here for swelling and redness to left upper lip, she is some induration and overlying scabbing where she been picking the area.  He is afebrile she is not diabetic or immunosuppressed. Because of patient likely developing abscess, it is indurated with no drainage.  No indication for I&D at this time.  Advised on warm compresses, will start on doxycycline.  LMP was in the past couple of weeks and denies chance of pregnancy.  Will give Tylenol for pain and advised on follow-up and return precautions.  She is agreeable plan of care and discharge.  She is nontoxic in appearance I ordered medication including Tylenol for pain Reevaluation of the patient after these medicines showed that the patient improved I have reviewed the patients home medicines and have made adjustments as needed      Risk OTC drugs. Prescription drug management.           Final Clinical Impression(s) / ED Diagnoses Final diagnoses:  Cellulitis of face    Rx / DC Orders ED Discharge Orders          Ordered    doxycycline (VIBRAMYCIN) 100 MG capsule  2 times daily        08/22/22 1817              Josem Kaufmann 08/22/22 1819    Eber Hong, MD 08/23/22 1133

## 2022-08-24 ENCOUNTER — Emergency Department (HOSPITAL_COMMUNITY)
Admission: EM | Admit: 2022-08-24 | Discharge: 2022-08-24 | Discharge status: Against Medical Advice | Payer: Medicaid Other | Attending: Emergency Medicine | Admitting: Emergency Medicine

## 2022-08-24 ENCOUNTER — Encounter (HOSPITAL_COMMUNITY): Payer: Self-pay | Admitting: *Deleted

## 2022-08-24 ENCOUNTER — Other Ambulatory Visit: Payer: Self-pay

## 2022-08-24 DIAGNOSIS — L0201 Cutaneous abscess of face: Secondary | ICD-10-CM | POA: Diagnosis present

## 2022-08-24 DIAGNOSIS — Z5321 Procedure and treatment not carried out due to patient leaving prior to being seen by health care provider: Secondary | ICD-10-CM | POA: Diagnosis not present

## 2022-08-24 NOTE — ED Triage Notes (Signed)
Pt with abscess to left side of face next to her lip. Pt states scabbed area came off and left an opening in the wound.

## 2022-08-26 ENCOUNTER — Telehealth: Payer: Self-pay | Admitting: Orthopaedic Surgery

## 2022-08-26 MED ORDER — OXYCODONE-ACETAMINOPHEN 5-325 MG PO TABS: 1.0000 | ORAL_TABLET | Freq: Four times a day (QID) | ORAL | 0 refills | Status: DC | PRN

## 2022-08-26 NOTE — Telephone Encounter (Signed)
Dr. Sanjuan Dame pt - spoke w/the patient, she is requesting a refill on Oxycodone 5-325 to be sent to [facility] on [address]

## 2022-09-01 ENCOUNTER — Telehealth: Payer: Self-pay | Admitting: Orthopaedic Surgery

## 2022-09-01 NOTE — Telephone Encounter (Signed)
Dr. Sanjuan Dame pt - pt is requesting a refill on Oxycodone 5-325 to [facility] on [address]

## 2022-09-02 MED ORDER — OXYCODONE-ACETAMINOPHEN 5-325 MG PO TABS: 1.0000 | ORAL_TABLET | Freq: Four times a day (QID) | ORAL | 0 refills | Status: DC | PRN

## 2022-09-08 ENCOUNTER — Telehealth: Payer: Self-pay | Admitting: Orthopaedic Surgery

## 2022-09-08 MED ORDER — OXYCODONE-ACETAMINOPHEN 5-325 MG PO TABS: 1.0000 | ORAL_TABLET | Freq: Four times a day (QID) | ORAL | 0 refills | Status: AC | PRN

## 2022-09-08 NOTE — Telephone Encounter (Signed)
Patient request refill on her pain medicine   oxyCODONE-acetaminophen (PERCOCET/ROXICET) 5-325 MG tablet   Pharmacy:  [facility] on [address]

## 2022-09-15 ENCOUNTER — Ambulatory Visit (HOSPITAL_COMMUNITY): Payer: Medicaid Other

## 2022-09-28 ENCOUNTER — Ambulatory Visit: Payer: Medicaid Other | Admitting: Orthopaedic Surgery

## 2022-10-25 ENCOUNTER — Telehealth: Payer: Self-pay | Admitting: Orthopaedic Surgery

## 2022-10-25 NOTE — Telephone Encounter (Signed)
Returned the patient's call, lvm for the pt to return my call.

## 2022-11-17 ENCOUNTER — Ambulatory Visit: Payer: Medicaid Other | Admitting: Orthopaedic Surgery

## 2023-02-09 ENCOUNTER — Emergency Department (HOSPITAL_COMMUNITY): Payer: Medicaid Other

## 2023-02-09 ENCOUNTER — Emergency Department (HOSPITAL_COMMUNITY)
Admission: EM | Admit: 2023-02-09 | Discharge: 2023-02-10 | Disposition: A | Discharge status: Home / Self Care | Payer: Medicaid Other | Attending: Emergency Medicine | Admitting: Emergency Medicine

## 2023-02-09 ENCOUNTER — Encounter (HOSPITAL_COMMUNITY): Payer: Self-pay | Admitting: Emergency Medicine

## 2023-02-09 ENCOUNTER — Other Ambulatory Visit: Payer: Self-pay

## 2023-02-09 DIAGNOSIS — I10 Essential (primary) hypertension: Secondary | ICD-10-CM | POA: Diagnosis not present

## 2023-02-09 DIAGNOSIS — E876 Hypokalemia: Secondary | ICD-10-CM | POA: Diagnosis not present

## 2023-02-09 DIAGNOSIS — R1084 Generalized abdominal pain: Secondary | ICD-10-CM | POA: Diagnosis not present

## 2023-02-09 DIAGNOSIS — R112 Nausea with vomiting, unspecified: Secondary | ICD-10-CM | POA: Insufficient documentation

## 2023-02-09 DIAGNOSIS — Z79899 Other long term (current) drug therapy: Secondary | ICD-10-CM | POA: Diagnosis not present

## 2023-02-09 LAB — CBC
HCT: 44.5 % (ref 36.0–46.0)
Hemoglobin: 14.7 g/dL (ref 12.0–15.0)
MCH: 31.2 pg (ref 26.0–34.0)
MCHC: 33 g/dL (ref 30.0–36.0)
MCV: 94.5 fL (ref 80.0–100.0)
Platelets: 275 10*3/uL (ref 150–400)
RBC: 4.71 MIL/uL (ref 3.87–5.11)
RDW: 14.2 % (ref 11.5–15.5)
WBC: 7 10*3/uL (ref 4.0–10.5)
nRBC: 0 % (ref 0.0–0.2)

## 2023-02-09 LAB — COMPREHENSIVE METABOLIC PANEL
ALT: 13 U/L (ref 0–44)
AST: 14 U/L — ABNORMAL LOW (ref 15–41)
Albumin: 3.9 g/dL (ref 3.5–5.0)
Alkaline Phosphatase: 50 U/L (ref 38–126)
Anion gap: 10 (ref 5–15)
BUN: 12 mg/dL (ref 6–20)
CO2: 26 mmol/L (ref 22–32)
Calcium: 9.1 mg/dL (ref 8.9–10.3)
Chloride: 104 mmol/L (ref 98–111)
Creatinine, Ser: 1.09 mg/dL — ABNORMAL HIGH (ref 0.44–1.00)
GFR, Estimated: >60 mL/min (ref >60)
Glucose, Bld: 112 mg/dL — ABNORMAL HIGH (ref 70–99)
Potassium: 3.1 mmol/L — ABNORMAL LOW (ref 3.5–5.1)
Sodium: 140 mmol/L (ref 135–145)
Total Bilirubin: 1.1 mg/dL (ref 0.0–1.2)
Total Protein: 7.3 g/dL (ref 6.5–8.1)

## 2023-02-09 LAB — URINALYSIS, ROUTINE W REFLEX MICROSCOPIC
Bilirubin Urine: NEGATIVE
Glucose, UA: NEGATIVE mg/dL
Hgb urine dipstick: NEGATIVE
Ketones, ur: NEGATIVE mg/dL
Leukocytes,Ua: NEGATIVE
Nitrite: NEGATIVE
Protein, ur: 30 mg/dL — AB
Specific Gravity, Urine: 1.027 (ref 1.005–1.030)
pH: 6 (ref 5.0–8.0)

## 2023-02-09 LAB — POC URINE PREG, ED: Preg Test, Ur: NEGATIVE

## 2023-02-09 LAB — LIPASE, BLOOD: Lipase: 29 U/L (ref 11–51)

## 2023-02-09 MED ORDER — HYDROMORPHONE HCL 1 MG/ML IJ SOLN
0.5000 mg | Freq: Once | INTRAMUSCULAR | Status: AC
Start: 2023-02-09 — End: 2023-02-09
  Administered 2023-02-09: 0.5 mg via INTRAVENOUS
  Filled 2023-02-09: qty 0.5

## 2023-02-09 MED ORDER — SODIUM CHLORIDE 0.9 % IV SOLN: 12.5000 mg | Freq: Four times a day (QID) | INTRAVENOUS | Status: DC | PRN

## 2023-02-09 MED ORDER — ONDANSETRON HCL 4 MG/2ML IJ SOLN
4.0000 mg | Freq: Once | INTRAMUSCULAR | Status: AC
  Administered 2023-02-09: 4 mg via INTRAVENOUS
  Filled 2023-02-09: qty 2

## 2023-02-09 MED ORDER — FENTANYL CITRATE PF 50 MCG/ML IJ SOSY
25.0000 ug | PREFILLED_SYRINGE | Freq: Once | INTRAMUSCULAR | Status: AC
Start: 2023-02-09 — End: 2023-02-09
  Administered 2023-02-09: 25 ug via INTRAVENOUS
  Filled 2023-02-09: qty 1

## 2023-02-09 MED ORDER — PROMETHAZINE HCL 25 MG/ML IJ SOLN
12.5000 mg | Freq: Four times a day (QID) | INTRAMUSCULAR | Status: DC | PRN
  Administered 2023-02-09: 12.5 mg via INTRAMUSCULAR
  Filled 2023-02-09: qty 1

## 2023-02-09 MED ORDER — PANTOPRAZOLE SODIUM 40 MG IV SOLR
40.0000 mg | Freq: Once | INTRAVENOUS | Status: AC
  Administered 2023-02-09: 40 mg via INTRAVENOUS
  Filled 2023-02-09: qty 10

## 2023-02-09 MED ORDER — SODIUM CHLORIDE 0.9 % IV BOLUS
1000.0000 mL | Freq: Once | INTRAVENOUS | Status: AC
  Administered 2023-02-09: 1000 mL via INTRAVENOUS

## 2023-02-09 MED ORDER — IOHEXOL 300 MG/ML  SOLN
100.0000 mL | Freq: Once | INTRAMUSCULAR | Status: AC | PRN
  Administered 2023-02-09: 100 mL via INTRAVENOUS

## 2023-02-09 NOTE — ED Triage Notes (Signed)
Pt c/o n/v x 3 days 

## 2023-02-09 NOTE — ED Notes (Signed)
 Blood work delayed due to IV failure to return blood. Lab contacted for strait stick

## 2023-02-09 NOTE — ED Notes (Signed)
 ED TO INPATIENT HANDOFF REPORT  ED Nurse Name and Phone #: [phone]  S Name/Age/Gender Priscilla Castillo 33 y.o. female Room/Bed: APA09/APA09  Code Status   Code Status: Not on file  Home/SNF/Other Home Patient oriented to: self, place, time, and situation Is this baseline? Yes   Triage Complete: Triage complete  Chief Complaint nausea  Triage Note Pt c/o n/v x 3 days.    Allergies Allergies  Allergen Reactions   Shellfish Allergy Anaphylaxis   Banana Hives and Swelling   Latex Itching   Nsaids Hives    Level of Care/Admitting Diagnosis ED Disposition     None       B Medical/Surgery History Past Medical History:  Diagnosis Date   GERD (gastroesophageal reflux disease)    Hypertension    Pelvic inflammatory disease (PID)    Past Surgical History:  Procedure Laterality Date   CESAREAN SECTION     x 2   CHOLECYSTECTOMY N/A 10/20/2020   Procedure: LAPAROSCOPIC CHOLECYSTECTOMY;  Surgeon: Alanda Allegra, MD;  Location: [facility];  Service: General;  Laterality: N/A;   WISDOM TOOTH EXTRACTION       A IV Location/Drains/Wounds Patient Lines/Drains/Airways Status     Active Line/Drains/Airways     Name Placement date Placement time Site Days   Peripheral IV 02/09/23 22 G Left;Anterior;Upper Arm 02/09/23  1819  Arm  less than 1            Intake/Output Last 24 hours No intake or output data in the 24 hours ending 02/09/23 1916  Labs/Imaging No results found for this or any previous visit (from the past 48 hours). No results found.  Pending Labs Unresulted Labs (From admission, onward)     Start     Ordered   02/09/23 1800  Lipase, blood  Once,   STAT        02/09/23 1759   02/09/23 1800  Comprehensive metabolic panel  Once,   STAT        02/09/23 1759   02/09/23 1800  CBC  Once,   STAT        02/09/23 1759   02/09/23 1800  Urinalysis, Routine w reflex microscopic -Urine, Clean Catch  Once,   URGENT       Question:  Specimen Source  Answer:   Urine, Clean Catch   02/09/23 1759            Vitals/Pain Today's Vitals   02/09/23 1752 02/09/23 1753 02/09/23 1754  BP:   (!) 161/93  Pulse:   75  Resp:   16  Temp:   98.3 F (36.8 C)  TempSrc:   Oral  SpO2:   100%  Weight:  88.5 kg   Height:  5\' 1"  (1.549 m)   PainSc: 10-Worst pain ever      Isolation Precautions No active isolations  Medications Medications  sodium chloride  0.9 % bolus 1,000 mL (1,000 mLs Intravenous New Bag/Given 02/09/23 1831)  ondansetron  (ZOFRAN ) injection 4 mg (4 mg Intravenous Given 02/09/23 1831)  pantoprazole  (PROTONIX ) injection 40 mg (40 mg Intravenous Given 02/09/23 1830)    Mobility walks     Focused Assessments    R Recommendations: See Admitting Provider Note  Report given to:   Additional Notes:

## 2023-02-09 NOTE — ED Notes (Signed)
 Pt unable to provide urine sample at this time

## 2023-02-10 MED ORDER — POTASSIUM CHLORIDE CRYS ER 20 MEQ PO TBCR: 20.0000 meq | EXTENDED_RELEASE_TABLET | Freq: Two times a day (BID) | ORAL | 0 refills | Status: DC

## 2023-02-10 MED ORDER — PROMETHAZINE HCL 25 MG RE SUPP: 25.0000 mg | Freq: Four times a day (QID) | RECTAL | 0 refills | Status: DC | PRN

## 2023-02-10 NOTE — ED Provider Notes (Signed)
[place] EMERGENCY DEPARTMENT AT [facility] Provider Note   CSN: [id number] Arrival date & time: 02/09/23  1749     History  Chief Complaint  Patient presents with   Emesis    Priscilla Castillo is a 33 y.o. female.   Emesis Associated symptoms: abdominal pain   Associated symptoms: no chills, no diarrhea, no fever and no headaches        Priscilla Castillo is a 33 y.o. female with past medical history of GERD and hypertension who presents to the Emergency Department complaining of nausea vomiting for 3 days.  She reports multiple episodes of vomiting, has been unable to tolerate foods or any liquids.  Symptoms worse today.  She also describes having generalized abdominal pain with burning sensation.  Denies fever, dysuria, flank pain and diarrhea.  No hematemesis.  Home Medications Prior to Admission medications   Medication Sig Start Date End Date Taking? Authorizing Provider  potassium chloride SA (KLOR-CON M) 20 MEQ tablet Take 1 tablet (20 mEq total) by mouth 2 (two) times daily. 02/09/23  Yes Keatyn Jawad, PA-C  promethazine (PHENERGAN) 25 MG suppository Place 1 suppository (25 mg total) rectally every 6 (six) hours as needed for nausea or vomiting. 02/09/23  Yes Traven Davids, PA-C  cloNIDine (CATAPRES) 0.1 MG tablet Take 0.1 mg by mouth daily. 09/20/22   [provider]  hydrochlorothiazide (HYDRODIURIL) 12.5 MG tablet Take 1 tablet (12.5 mg total) by mouth daily. 03/13/21   Wallis Bamberg, PA-C  ofloxacin (OCUFLOX) 0.3 % ophthalmic solution Place 1 drop into both eyes 4 (four) times daily. Patient not taking: Reported on 07/01/2022 10/13/21   Eber Hong, MD  SUBOXONE 8-2 MG FILM Place under the tongue 4 (four) times daily. 09/21/22   [provider]      Allergies    Shellfish allergy, Banana, Latex, and Nsaids    Review of Systems   Review of Systems  Constitutional:  Positive for appetite change. Negative for chills and fever.   Respiratory:  Negative for shortness of breath.   Cardiovascular:  Negative for chest pain.  Gastrointestinal:  Positive for abdominal pain, nausea and vomiting. Negative for constipation and diarrhea.  Genitourinary:  Negative for dysuria and flank pain.  Neurological:  Negative for dizziness, weakness, numbness and headaches.    Physical Exam Updated Vital Signs BP (!) 157/86   Pulse 60   Temp 98.3 F (36.8 C) (Oral)   Resp 16   Ht 5\' 1"  (1.549 m)   Wt 88.5 kg   LMP 02/02/2023   SpO2 99%   BMI 36.84 kg/m  Physical Exam Vitals and nursing note reviewed.  Constitutional:      General: She is not in acute distress.    Appearance: Normal appearance. She is not toxic-appearing.  HENT:     Mouth/Throat:     Mouth: Mucous membranes are dry.  Cardiovascular:     Rate and Rhythm: Normal rate and regular rhythm.     Pulses: Normal pulses.  Pulmonary:     Effort: Pulmonary effort is normal.  Abdominal:     General: There is no distension.     Palpations: Abdomen is soft.     Tenderness: There is abdominal tenderness.     Comments: Diffuse tenderness palpation of the abdomen.  No guarding or rebound.  Abdomen is soft  Musculoskeletal:        General: Normal range of motion.  Skin:    General: Skin is warm.  Capillary Refill: Capillary refill takes less than 2 seconds.  Neurological:     General: No focal deficit present.     Mental Status: She is alert.     Sensory: No sensory deficit.     Motor: No weakness.     ED Results / Procedures / Treatments   Labs (all labs ordered are listed, but only abnormal results are displayed) Labs Reviewed  COMPREHENSIVE METABOLIC PANEL - Abnormal; Notable for the following components:      Result Value   Potassium 3.1 (*)    Glucose, Bld 112 (*)    Creatinine, Ser 1.09 (*)    AST 14 (*)    All other components within normal limits  URINALYSIS, ROUTINE W REFLEX MICROSCOPIC - Abnormal; Notable for the following components:    APPearance HAZY (*)    Protein, ur 30 (*)    Bacteria, UA RARE (*)    All other components within normal limits  LIPASE, BLOOD  CBC  POC URINE PREG, ED    EKG None  Radiology CT ABDOMEN PELVIS W CONTRAST Result Date: 02/09/2023 CLINICAL DATA:  Abdominal pain, acute, nonlocalized Pt c/o n/v x 3 days. EXAM: CT ABDOMEN AND PELVIS WITH CONTRAST TECHNIQUE: Multidetector CT imaging of the abdomen and pelvis was performed using the standard protocol following bolus administration of intravenous contrast. RADIATION DOSE REDUCTION: This exam was performed according to the departmental dose-optimization program which includes automated exposure control, adjustment of the mA and/or kV according to patient size and/or use of iterative reconstruction technique. CONTRAST:  OMNIPAQUE IOHEXOL 300 MG/ML  SOLN COMPARISON:  None Available. FINDINGS: Lower chest: No acute abnormality. Hepatobiliary: No focal liver abnormality. No gallstones, gallbladder wall thickening, or pericholecystic fluid. No biliary dilatation. Pancreas: No focal lesion. Normal pancreatic contour. No surrounding inflammatory changes. No main pancreatic ductal dilatation. Spleen: Normal in size without focal abnormality. Adrenals/Urinary Tract: No adrenal nodule bilaterally. Bilateral kidneys enhance symmetrically. No hydronephrosis. No hydroureter. The urinary bladder is unremarkable. Stomach/Bowel: Stomach is within normal limits. No evidence of bowel wall thickening or dilatation. Appendix appears normal. Vascular/Lymphatic: No abdominal aorta or iliac aneurysm. Mild atherosclerotic plaque of the aorta and its branches. No abdominal, pelvic, or inguinal lymphadenopathy. Reproductive: Uterus and bilateral adnexa are unremarkable. Other: No intraperitoneal free fluid. No intraperitoneal free gas. No organized fluid collection. Musculoskeletal: Tiny fat containing umbilical hernia. No suspicious lytic or blastic osseous lesions. No acute  displaced fracture. Multilevel degenerative changes of the spine. IMPRESSION: 1. No acute intra-abdominal or intrapelvic abnormality. 2. Status post cholecystectomy. Electronically Signed   By: Tish Frederickson M.D.   On: 02/09/2023 21:27    Procedures Procedures    Medications Ordered in ED Medications  promethazine (PHENERGAN) injection 12.5 mg (12.5 mg Intramuscular Given 02/09/23 2014)  sodium chloride 0.9 % bolus 1,000 mL (0 mLs Intravenous Stopped 02/09/23 2014)  ondansetron (ZOFRAN) injection 4 mg (4 mg Intravenous Given 02/09/23 1831)  pantoprazole (PROTONIX) injection 40 mg (40 mg Intravenous Given 02/09/23 1830)  iohexol (OMNIPAQUE) 300 MG/ML solution 100 mL (100 mLs Intravenous Contrast Given 02/09/23 2108)  fentaNYL (SUBLIMAZE) injection 25 mcg (25 mcg Intravenous Given 02/09/23 2129)  HYDROmorphone (DILAUDID) injection 0.5 mg (0.5 mg Intravenous Given 02/09/23 2317)    ED Course/ Medical Decision Making/ A&P                                 Medical Decision Making Patient with 3-day history  of nausea and vomiting.  Symptoms worse today.  Associated with some generalized abdominal pain as well.  No reported fever flank pain or dysuria.  No diarrhea reported.  On exam, patient actively dry heaving, has diffuse abdominal pain without guarding or rebound.  Abdomen is soft on my exam.  I suspect this is related to viral process.  Exacerbation of her GERD symptoms, acute abdomen also considered but felt less likely  Amount and/or Complexity of Data Reviewed Labs: ordered.    Details: Labs interpreted by me, no evidence of leukocytosis, chemistries show mild hypokalemia, creatinine near baseline urinalysis without evidence of infection and urine pregnancy test is negative Radiology: ordered.    Details: CT abdomen and pelvis without acute intra-abdominal or intrapelvic process Discussion of management or test interpretation with external provider(s): Patient has received IV fluids  here, antiemetics and pain medication.  Tolerating ice chips without difficulty.  On last recheck reports feeling better without further vomiting.  Suspect viral process.  Patient appears appropriate for discharge home, prescription written for short course of potassium and antiemetic.  She will follow-up with PCP to have potassium level rechecked in 1 week.  Risk Prescription drug management.           Final Clinical Impression(s) / ED Diagnoses Final diagnoses:  Nausea and vomiting, unspecified vomiting type  Generalized abdominal pain    Rx / DC Orders ED Discharge Orders          Ordered    promethazine (PHENERGAN) 25 MG suppository  Every 6 hours PRN        02/10/23 0001    potassium chloride SA (KLOR-CON M) 20 MEQ tablet  2 times daily        02/10/23 0001              Jaaliyah Lucatero, [place], PA-C 02/10/23 0010    Loetta Rough, MD 02/12/23 [phone]

## 2023-03-12 IMAGING — CT CT ABD-PELV W/ CM
2 of 4 series · 16 of 46 positions shown, 18 images · IV contrast (Omnipaque or Isovue)
Comparison: 08/04/2019

CLINICAL DATA: Abdominal pain with nausea vomiting and diarrhea
intermittently for 1 year

EXAM:
CT ABDOMEN AND PELVIS WITH CONTRAST
TECHNIQUE: Multidetector CT imaging of the abdomen and pelvis was performed
using the standard protocol following bolus administration of
intravenous contrast.
CONTRAST:  100mL OMNIPAQUE IOHEXOL 300 MG/ML  SOLN

[Series 2: axial st · axial · 0.73mm/px · z∈[+636,+1076]mm · 13 of 98 slices shown, 15 images]
[im 5/98  soft-tissue]
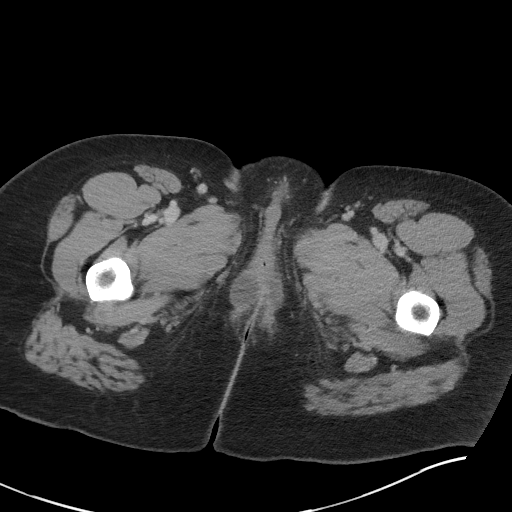
[im 5/98  bone]
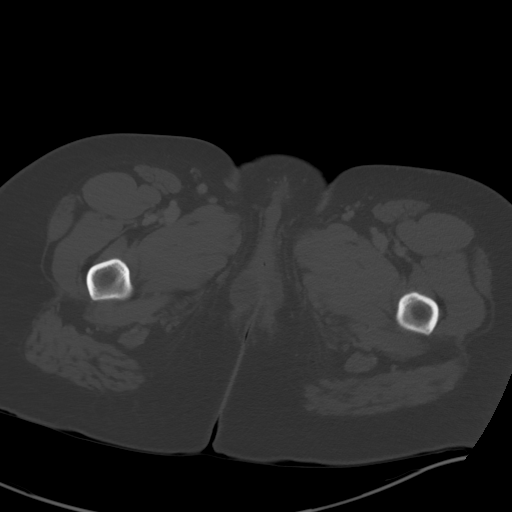
[im 14/98  soft-tissue]
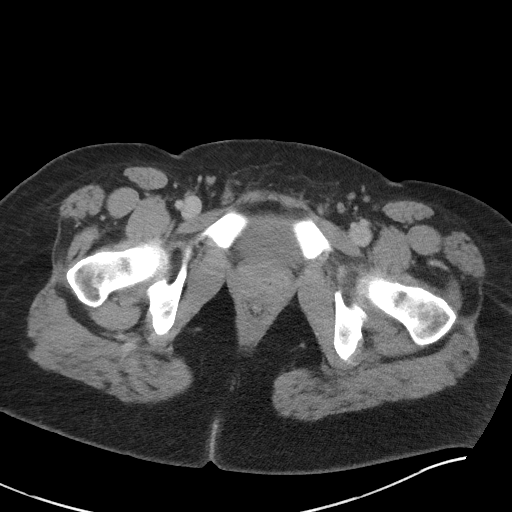
[im 23/98  soft-tissue]
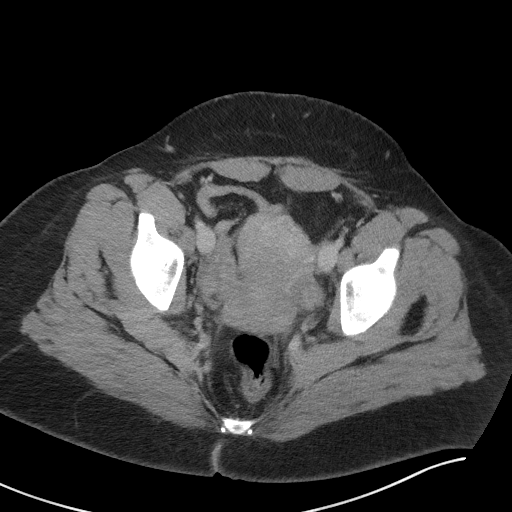
[im 27/98  soft-tissue]
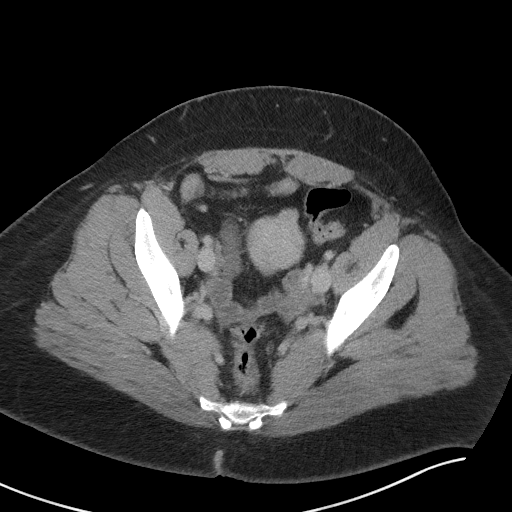
[im 36/98  soft-tissue]
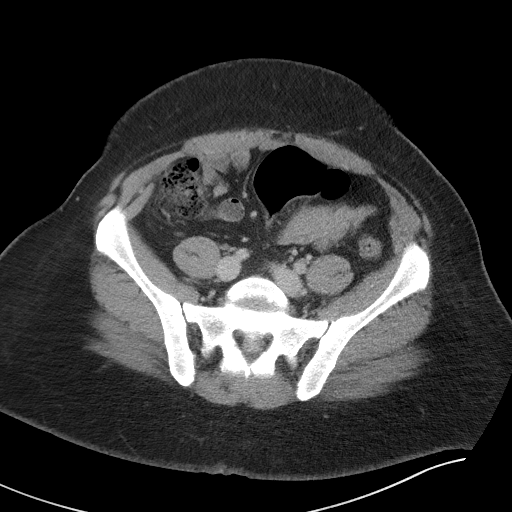
[im 40/98  soft-tissue]
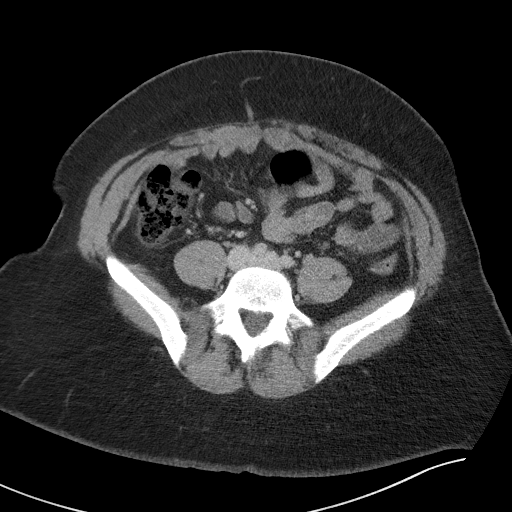
[im 49/98  soft-tissue]
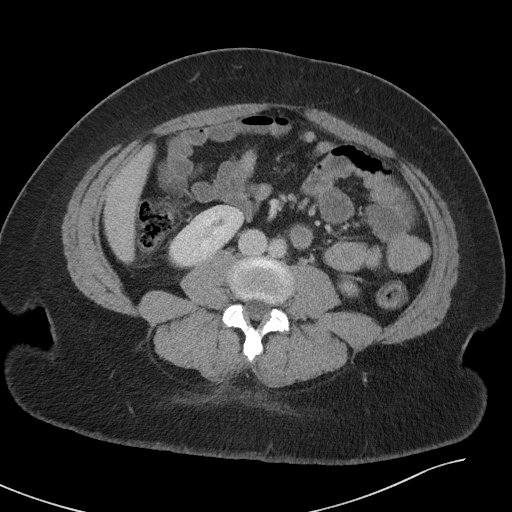
[im 58/98  soft-tissue]
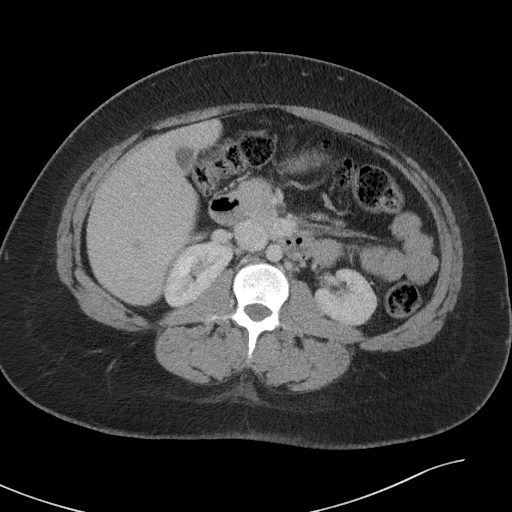
[im 62/98  soft-tissue]
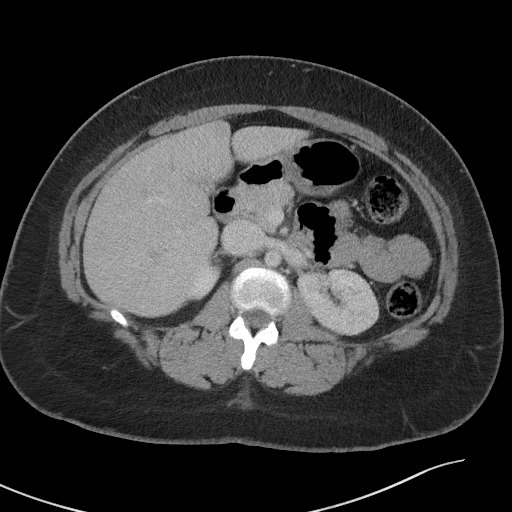
[im 62/98  bone]
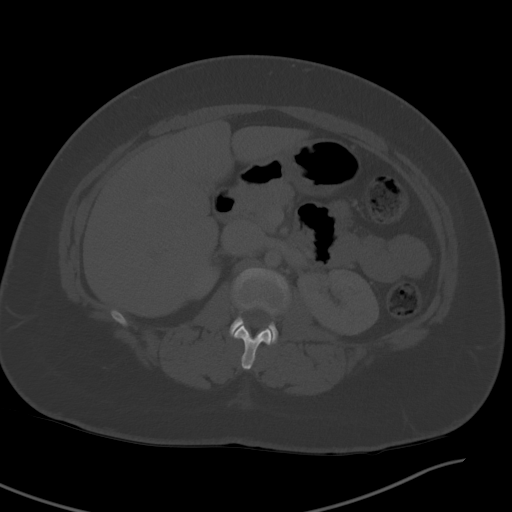
[im 71/98  soft-tissue]
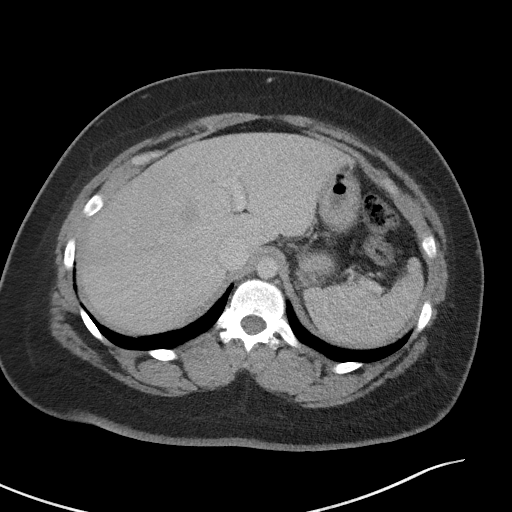
[im 75/98  soft-tissue]
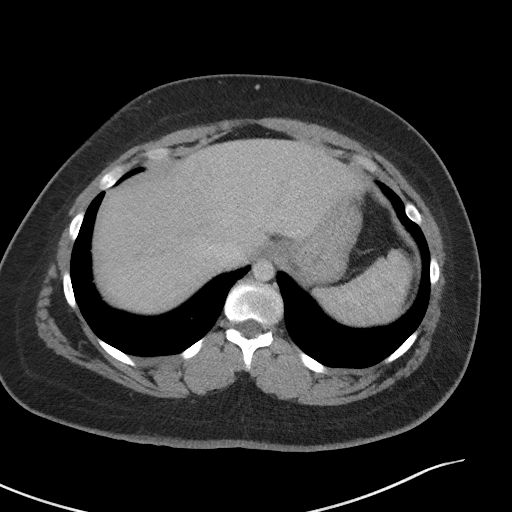
[im 84/98  soft-tissue]
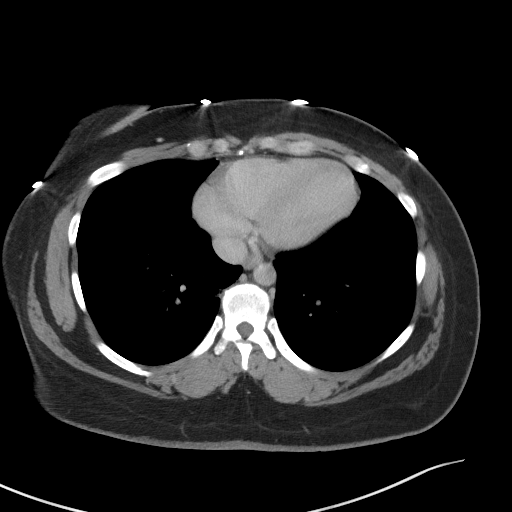
[im 93/98  soft-tissue]
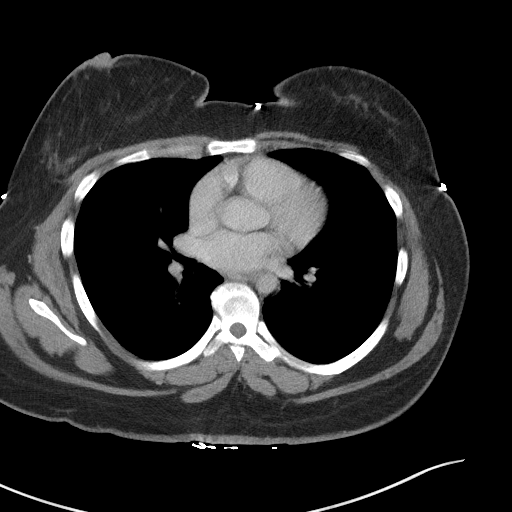

[Series 5: coronal st · coronal · 0.86mm/px · 3 of 117 slices shown]
[im 39/117  soft-tissue]
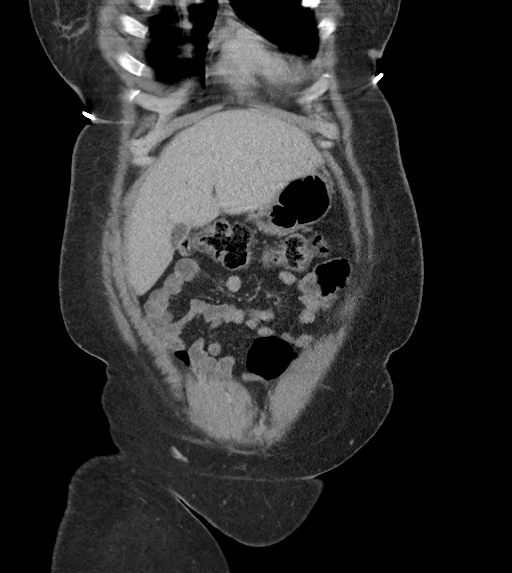
[im 52/117  soft-tissue]
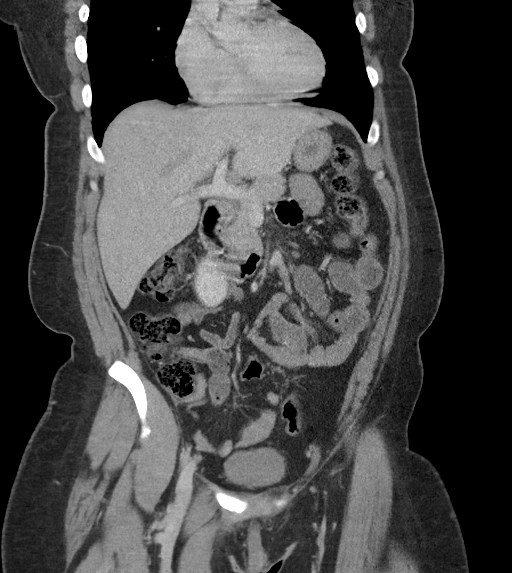
[im 65/117  soft-tissue]
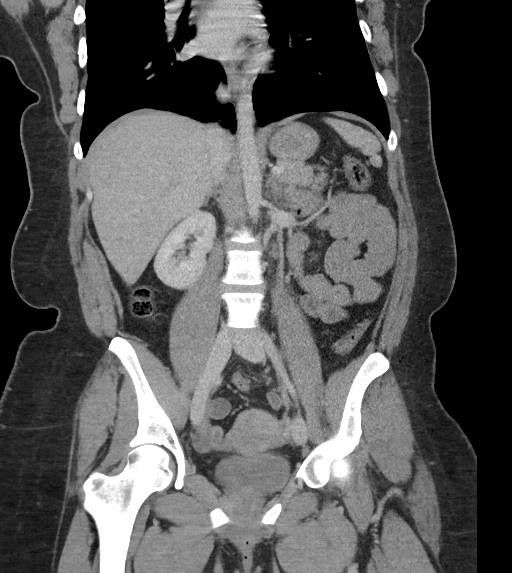

[16 of 46 positions shown; findings below may reference images not displayed]

FINDINGS: Lower chest: No acute abnormality.

Hepatobiliary: Gallbladder is decompressed but demonstrates multiple
stones within. The liver is well visualized and within normal
limits. No biliary ductal dilatation is seen.

Pancreas: Unremarkable. No pancreatic ductal dilatation or
surrounding inflammatory changes.

Spleen: Normal in size without focal abnormality.

Adrenals/Urinary Tract: Adrenal glands are within normal limits.
Kidneys demonstrate a normal enhancement pattern bilaterally. No
renal calculi or obstructive changes are seen. The bladder is
partially distended.

Stomach/Bowel: The appendix is within normal limits. Colon shows no
obstructive or inflammatory changes. Stomach and small bowel are
unremarkable.

Vascular/Lymphatic: No significant vascular findings are present. No
enlarged abdominal or pelvic lymph nodes.

Reproductive: Uterus is well visualized and within normal limits.
Involuting cyst is noted within the left adnexa. This measures
approximately 14 mm.

Other: No abdominal wall hernia or abnormality. No abdominopelvic
ascites.

Musculoskeletal: No acute or significant osseous findings.
IMPRESSION: Cholelithiasis without complicating factors.

Normal appearing appendix.

Involuting cyst in the left adnexa.

## 2023-04-19 ENCOUNTER — Telehealth: Payer: Self-pay | Admitting: Orthopaedic Surgery

## 2023-04-19 NOTE — Telephone Encounter (Signed)
Returned the pt's call, lvm for her to call me back 

## 2023-04-20 ENCOUNTER — Other Ambulatory Visit (INDEPENDENT_AMBULATORY_CARE_PROVIDER_SITE_OTHER)

## 2023-04-20 ENCOUNTER — Ambulatory Visit: Admitting: Orthopaedic Surgery

## 2023-04-20 ENCOUNTER — Encounter: Payer: Self-pay | Admitting: Orthopaedic Surgery

## 2023-04-20 VITALS — BP 173/141 | HR 90 | Ht 61.0 in | Wt 199.0 lb

## 2023-04-20 DIAGNOSIS — M25551 Pain in right hip: Secondary | ICD-10-CM | POA: Diagnosis not present

## 2023-04-20 MED ORDER — PREDNISONE 5 MG (21) PO TBPK: ORAL_TABLET | ORAL | 0 refills | Status: DC

## 2023-04-20 NOTE — Progress Notes (Signed)
 My right hip hurts.  She complains of pain in the right hip.  She has no falls.  She has pain with standing and moving.  She has no trauma, no redness, no paresthesias.    She has been on buprenorphine-nalox 802 film by Dr. Rozann Lesches, last given in November.  Right hip has good ROM, she uses a cane, she has some tenderness over the trochanteric area, gait is limp to the right using a cane, NV intact, no distal edema.  X-rays were done of the right hip, reported separately.  Encounter Diagnosis  Name Primary?   Pain in right hip Yes   Begin medrol dose pack.  Begin ibuprofen after that.  See Dr. Cathey Endow for any pain medicine.  Return in three weeks.  Call if any problem.  Precautions discussed.  Electronically Signed Darreld Mclean, MD 3/26/20253:03 PM

## 2023-05-10 ENCOUNTER — Telehealth: Payer: Self-pay | Admitting: Orthopaedic Surgery

## 2023-05-10 DIAGNOSIS — F40240 Claustrophobia: Secondary | ICD-10-CM

## 2023-05-11 ENCOUNTER — Ambulatory Visit: Admitting: Orthopaedic Surgery

## 2023-06-09 ENCOUNTER — Other Ambulatory Visit: Payer: Self-pay

## 2023-06-09 ENCOUNTER — Emergency Department (HOSPITAL_COMMUNITY)
Admission: EM | Admit: 2023-06-09 | Discharge: 2023-06-09 | Discharge status: Against Medical Advice | Attending: Emergency Medicine | Admitting: Emergency Medicine

## 2023-06-09 ENCOUNTER — Encounter (HOSPITAL_COMMUNITY): Payer: Self-pay

## 2023-06-09 DIAGNOSIS — Z5321 Procedure and treatment not carried out due to patient leaving prior to being seen by health care provider: Secondary | ICD-10-CM | POA: Diagnosis not present

## 2023-06-09 DIAGNOSIS — R1110 Vomiting, unspecified: Secondary | ICD-10-CM | POA: Insufficient documentation

## 2023-06-09 LAB — COMPREHENSIVE METABOLIC PANEL WITH GFR
ALT: 12 U/L (ref 0–44)
AST: 14 U/L — ABNORMAL LOW (ref 15–41)
Albumin: 4.5 g/dL (ref 3.5–5.0)
Alkaline Phosphatase: 49 U/L (ref 38–126)
Anion gap: 14 (ref 5–15)
BUN: 14 mg/dL (ref 6–20)
CO2: 20 mmol/L — ABNORMAL LOW (ref 22–32)
Calcium: 10 mg/dL (ref 8.9–10.3)
Chloride: 102 mmol/L (ref 98–111)
Creatinine, Ser: 1.08 mg/dL — ABNORMAL HIGH (ref 0.44–1.00)
GFR, Estimated: >60 mL/min (ref >60)
Glucose, Bld: 122 mg/dL — ABNORMAL HIGH (ref 70–99)
Potassium: 3 mmol/L — ABNORMAL LOW (ref 3.5–5.1)
Sodium: 136 mmol/L (ref 135–145)
Total Bilirubin: 1.6 mg/dL — ABNORMAL HIGH (ref 0.0–1.2)
Total Protein: 8.4 g/dL — ABNORMAL HIGH (ref 6.5–8.1)

## 2023-06-09 LAB — CBC WITH DIFFERENTIAL/PLATELET
Abs Immature Granulocytes: 0.03 10*3/uL (ref 0.00–0.07)
Basophils Absolute: 0 10*3/uL (ref 0.0–0.1)
Basophils Relative: 0 %
Eosinophils Absolute: 0 10*3/uL (ref 0.0–0.5)
Eosinophils Relative: 0 %
HCT: 43.4 % (ref 36.0–46.0)
Hemoglobin: 15.4 g/dL — ABNORMAL HIGH (ref 12.0–15.0)
Immature Granulocytes: 0 %
Lymphocytes Relative: 16 %
Lymphs Abs: 1.6 10*3/uL (ref 0.7–4.0)
MCH: 32 pg (ref 26.0–34.0)
MCHC: 35.5 g/dL (ref 30.0–36.0)
MCV: 90.2 fL (ref 80.0–100.0)
Monocytes Absolute: 0.6 10*3/uL (ref 0.1–1.0)
Monocytes Relative: 6 %
Neutro Abs: 7.5 10*3/uL (ref 1.7–7.7)
Neutrophils Relative %: 78 %
Platelets: 310 10*3/uL (ref 150–400)
RBC: 4.81 MIL/uL (ref 3.87–5.11)
RDW: 12.5 % (ref 11.5–15.5)
WBC: 9.7 10*3/uL (ref 4.0–10.5)
nRBC: 0 % (ref 0.0–0.2)

## 2023-06-09 LAB — TYPE AND SCREEN
ABO/RH(D): A POS
Antibody Screen: NEGATIVE

## 2023-06-09 LAB — LIPASE, BLOOD: Lipase: 26 U/L (ref 11–51)

## 2023-06-09 NOTE — ED Triage Notes (Signed)
 Pt arrived via POV c/o emesis since yesterday. Pt reports seeing blood today in her emesis. Pt reports zofran  does not help and she needs phenergan  and dilaudid  and IV fluids. Pt reports this happens to her every couple months.

## 2023-06-11 ENCOUNTER — Observation Stay (HOSPITAL_COMMUNITY)

## 2023-06-11 ENCOUNTER — Other Ambulatory Visit: Payer: Self-pay

## 2023-06-11 ENCOUNTER — Inpatient Hospital Stay (HOSPITAL_COMMUNITY)
Admission: EM | Admit: 2023-06-11 | Discharge: 2023-06-14 | DRG: 832 | Disposition: A | Discharge status: Home / Self Care | Attending: Internal Medicine | Admitting: Internal Medicine

## 2023-06-11 ENCOUNTER — Encounter (HOSPITAL_COMMUNITY): Payer: Self-pay

## 2023-06-11 DIAGNOSIS — O219 Vomiting of pregnancy, unspecified: Principal | ICD-10-CM | POA: Diagnosis present

## 2023-06-11 DIAGNOSIS — Z886 Allergy status to analgesic agent status: Secondary | ICD-10-CM

## 2023-06-11 DIAGNOSIS — F17210 Nicotine dependence, cigarettes, uncomplicated: Secondary | ICD-10-CM | POA: Diagnosis present

## 2023-06-11 DIAGNOSIS — Z91018 Allergy to other foods: Secondary | ICD-10-CM

## 2023-06-11 DIAGNOSIS — I10 Essential (primary) hypertension: Secondary | ICD-10-CM | POA: Diagnosis not present

## 2023-06-11 DIAGNOSIS — K219 Gastro-esophageal reflux disease without esophagitis: Secondary | ICD-10-CM | POA: Diagnosis present

## 2023-06-11 DIAGNOSIS — O210 Mild hyperemesis gravidarum: Secondary | ICD-10-CM | POA: Insufficient documentation

## 2023-06-11 DIAGNOSIS — R1110 Vomiting, unspecified: Principal | ICD-10-CM | POA: Diagnosis present

## 2023-06-11 DIAGNOSIS — F1290 Cannabis use, unspecified, uncomplicated: Secondary | ICD-10-CM | POA: Diagnosis present

## 2023-06-11 DIAGNOSIS — F1120 Opioid dependence, uncomplicated: Secondary | ICD-10-CM | POA: Diagnosis present

## 2023-06-11 DIAGNOSIS — Z79899 Other long term (current) drug therapy: Secondary | ICD-10-CM

## 2023-06-11 DIAGNOSIS — Z3490 Encounter for supervision of normal pregnancy, unspecified, unspecified trimester: Secondary | ICD-10-CM

## 2023-06-11 DIAGNOSIS — O99331 Smoking (tobacco) complicating pregnancy, first trimester: Secondary | ICD-10-CM | POA: Diagnosis present

## 2023-06-11 DIAGNOSIS — Z91040 Latex allergy status: Secondary | ICD-10-CM

## 2023-06-11 DIAGNOSIS — O99281 Endocrine, nutritional and metabolic diseases complicating pregnancy, first trimester: Secondary | ICD-10-CM | POA: Diagnosis present

## 2023-06-11 DIAGNOSIS — E871 Hypo-osmolality and hyponatremia: Secondary | ICD-10-CM | POA: Diagnosis present

## 2023-06-11 DIAGNOSIS — Z3A01 Less than 8 weeks gestation of pregnancy: Secondary | ICD-10-CM

## 2023-06-11 DIAGNOSIS — O99321 Drug use complicating pregnancy, first trimester: Secondary | ICD-10-CM | POA: Diagnosis present

## 2023-06-11 DIAGNOSIS — Z91013 Allergy to seafood: Secondary | ICD-10-CM

## 2023-06-11 DIAGNOSIS — O10919 Unspecified pre-existing hypertension complicating pregnancy, unspecified trimester: Secondary | ICD-10-CM | POA: Insufficient documentation

## 2023-06-11 DIAGNOSIS — O99611 Diseases of the digestive system complicating pregnancy, first trimester: Secondary | ICD-10-CM | POA: Diagnosis present

## 2023-06-11 DIAGNOSIS — O161 Unspecified maternal hypertension, first trimester: Secondary | ICD-10-CM | POA: Diagnosis present

## 2023-06-11 DIAGNOSIS — E876 Hypokalemia: Secondary | ICD-10-CM

## 2023-06-11 DIAGNOSIS — O468X1 Other antepartum hemorrhage, first trimester: Secondary | ICD-10-CM | POA: Diagnosis present

## 2023-06-11 DIAGNOSIS — R102 Pelvic and perineal pain: Secondary | ICD-10-CM | POA: Diagnosis present

## 2023-06-11 LAB — URINALYSIS, W/ REFLEX TO CULTURE (INFECTION SUSPECTED)
Bacteria, UA: NONE SEEN
Bilirubin Urine: NEGATIVE
Glucose, UA: NEGATIVE mg/dL
Hgb urine dipstick: NEGATIVE
Ketones, ur: 80 mg/dL — AB
Nitrite: NEGATIVE
Protein, ur: NEGATIVE mg/dL
Specific Gravity, Urine: 1.019 (ref 1.005–1.030)
pH: 6 (ref 5.0–8.0)

## 2023-06-11 LAB — COMPREHENSIVE METABOLIC PANEL WITH GFR
ALT: 13 U/L (ref 0–44)
AST: 15 U/L (ref 15–41)
Albumin: 4.2 g/dL (ref 3.5–5.0)
Alkaline Phosphatase: 47 U/L (ref 38–126)
Anion gap: 12 (ref 5–15)
BUN: 10 mg/dL (ref 6–20)
CO2: 23 mmol/L (ref 22–32)
Calcium: 9.4 mg/dL (ref 8.9–10.3)
Chloride: 95 mmol/L — ABNORMAL LOW (ref 98–111)
Creatinine, Ser: 0.9 mg/dL (ref 0.44–1.00)
GFR, Estimated: >60 mL/min (ref >60)
Glucose, Bld: 99 mg/dL (ref 70–99)
Potassium: 2.6 mmol/L — CL (ref 3.5–5.1)
Sodium: 130 mmol/L — ABNORMAL LOW (ref 135–145)
Total Bilirubin: 2.2 mg/dL — ABNORMAL HIGH (ref 0.0–1.2)
Total Protein: 8 g/dL (ref 6.5–8.1)

## 2023-06-11 LAB — CBC
HCT: 44.3 % (ref 36.0–46.0)
Hemoglobin: 15.8 g/dL — ABNORMAL HIGH (ref 12.0–15.0)
MCH: 32 pg (ref 26.0–34.0)
MCHC: 35.7 g/dL (ref 30.0–36.0)
MCV: 89.9 fL (ref 80.0–100.0)
Platelets: 284 10*3/uL (ref 150–400)
RBC: 4.93 MIL/uL (ref 3.87–5.11)
RDW: 12.3 % (ref 11.5–15.5)
WBC: 11.9 10*3/uL — ABNORMAL HIGH (ref 4.0–10.5)
nRBC: 0 % (ref 0.0–0.2)

## 2023-06-11 LAB — RAPID URINE DRUG SCREEN, HOSP PERFORMED
Amphetamines: NOT DETECTED
Barbiturates: NOT DETECTED
Benzodiazepines: POSITIVE — AB
Cocaine: NOT DETECTED
Opiates: POSITIVE — AB
Tetrahydrocannabinol: POSITIVE — AB

## 2023-06-11 LAB — MAGNESIUM: Magnesium: 2.1 mg/dL (ref 1.7–2.4)

## 2023-06-11 LAB — HCG, SERUM, QUALITATIVE: Preg, Serum: POSITIVE — AB

## 2023-06-11 LAB — HCG, QUANTITATIVE, PREGNANCY: hCG, Beta Chain, Quant, S: 109429 m[IU]/mL — ABNORMAL HIGH (ref <5)

## 2023-06-11 LAB — LIPASE, BLOOD: Lipase: 27 U/L (ref 11–51)

## 2023-06-11 MED ORDER — PANTOPRAZOLE SODIUM 40 MG IV SOLR
40.0000 mg | Freq: Two times a day (BID) | INTRAVENOUS | Status: DC
  Administered 2023-06-11 – 2023-06-14 (×6): 40 mg via INTRAVENOUS
  Filled 2023-06-11 (×6): qty 10

## 2023-06-11 MED ORDER — POTASSIUM CHLORIDE 10 MEQ/100ML IV SOLN
10.0000 meq | INTRAVENOUS | Status: AC
  Administered 2023-06-11 (×3): 10 meq via INTRAVENOUS
  Filled 2023-06-11 (×3): qty 100

## 2023-06-11 MED ORDER — SODIUM CHLORIDE 0.9 % IV SOLN
12.5000 mg | INTRAVENOUS | Status: DC
  Filled 2023-06-11: qty 0.5

## 2023-06-11 MED ORDER — LIDOCAINE VISCOUS HCL 2 % MT SOLN
15.0000 mL | Freq: Once | OROMUCOSAL | Status: AC
  Administered 2023-06-11: 15 mL via ORAL
  Filled 2023-06-11: qty 15

## 2023-06-11 MED ORDER — HYDROMORPHONE HCL 1 MG/ML IJ SOLN
0.5000 mg | Freq: Once | INTRAMUSCULAR | Status: AC
  Administered 2023-06-11: 0.5 mg via INTRAVENOUS
  Filled 2023-06-11: qty 0.5

## 2023-06-11 MED ORDER — FAMOTIDINE IN NACL 20-0.9 MG/50ML-% IV SOLN
20.0000 mg | Freq: Once | INTRAVENOUS | Status: AC
  Administered 2023-06-11: 20 mg via INTRAVENOUS
  Filled 2023-06-11: qty 50

## 2023-06-11 MED ORDER — HYDROMORPHONE HCL 1 MG/ML IJ SOLN
0.5000 mg | INTRAMUSCULAR | Status: DC | PRN
  Administered 2023-06-11 – 2023-06-14 (×14): 0.5 mg via INTRAVENOUS
  Filled 2023-06-11 (×15): qty 0.5

## 2023-06-11 MED ORDER — METOCLOPRAMIDE HCL 5 MG/ML IJ SOLN
10.0000 mg | Freq: Once | INTRAMUSCULAR | Status: AC
  Administered 2023-06-11: 10 mg via INTRAVENOUS
  Filled 2023-06-11: qty 2

## 2023-06-11 MED ORDER — FAMOTIDINE IN NACL 20-0.9 MG/50ML-% IV SOLN
20.0000 mg | Freq: Two times a day (BID) | INTRAVENOUS | Status: DC
  Administered 2023-06-11 – 2023-06-14 (×6): 20 mg via INTRAVENOUS
  Filled 2023-06-11 (×6): qty 50

## 2023-06-11 MED ORDER — ALUM & MAG HYDROXIDE-SIMETH 200-200-20 MG/5ML PO SUSP
30.0000 mL | Freq: Once | ORAL | Status: AC
  Administered 2023-06-11: 30 mL via ORAL
  Filled 2023-06-11: qty 30

## 2023-06-11 MED ORDER — PYRIDOXINE HCL 100 MG/ML IJ SOLN
100.0000 mg | Freq: Every day | INTRAMUSCULAR | Status: DC
  Administered 2023-06-11 – 2023-06-14 (×4): 100 mg via INTRAVENOUS
  Filled 2023-06-11 (×5): qty 1

## 2023-06-11 MED ORDER — PROCHLORPERAZINE EDISYLATE 10 MG/2ML IJ SOLN
10.0000 mg | Freq: Four times a day (QID) | INTRAMUSCULAR | Status: DC
  Administered 2023-06-11 – 2023-06-14 (×11): 10 mg via INTRAVENOUS
  Filled 2023-06-11 (×11): qty 2

## 2023-06-11 MED ORDER — POTASSIUM CHLORIDE CRYS ER 20 MEQ PO TBCR
40.0000 meq | EXTENDED_RELEASE_TABLET | Freq: Once | ORAL | Status: DC
  Filled 2023-06-11: qty 2

## 2023-06-11 MED ORDER — SODIUM CHLORIDE 0.9 % IV SOLN
25.0000 mg | INTRAVENOUS | Status: AC
  Administered 2023-06-11: 25 mg via INTRAVENOUS
  Filled 2023-06-11: qty 1

## 2023-06-11 MED ORDER — PRENATAL MULTIVITAMIN CH
1.0000 | ORAL_TABLET | Freq: Every day | ORAL | Status: DC
  Administered 2023-06-12 – 2023-06-14 (×3): 1 via ORAL
  Filled 2023-06-11 (×4): qty 1

## 2023-06-11 MED ORDER — ACETAMINOPHEN 325 MG PO TABS: 650.0000 mg | ORAL_TABLET | Freq: Four times a day (QID) | ORAL | Status: DC | PRN

## 2023-06-11 MED ORDER — ONDANSETRON HCL 4 MG/2ML IJ SOLN
4.0000 mg | Freq: Once | INTRAMUSCULAR | Status: DC | PRN
  Filled 2023-06-11: qty 2

## 2023-06-11 MED ORDER — POTASSIUM CHLORIDE IN NACL 40-0.9 MEQ/L-% IV SOLN
INTRAVENOUS | Status: AC
  Filled 2023-06-11 (×3): qty 1000

## 2023-06-11 MED ORDER — HEPARIN SODIUM (PORCINE) 5000 UNIT/ML IJ SOLN
5000.0000 [IU] | Freq: Three times a day (TID) | INTRAMUSCULAR | Status: DC
  Administered 2023-06-12 – 2023-06-14 (×6): 5000 [IU] via SUBCUTANEOUS
  Filled 2023-06-11 (×7): qty 1

## 2023-06-11 MED ORDER — ACETAMINOPHEN 500 MG PO TABS
1000.0000 mg | ORAL_TABLET | Freq: Once | ORAL | Status: DC
  Filled 2023-06-11: qty 2

## 2023-06-11 MED ORDER — ACETAMINOPHEN 650 MG RE SUPP: 650.0000 mg | Freq: Four times a day (QID) | RECTAL | Status: DC | PRN

## 2023-06-11 MED ORDER — LACTATED RINGERS IV BOLUS
1000.0000 mL | Freq: Once | INTRAVENOUS | Status: AC
  Administered 2023-06-11: 1000 mL via INTRAVENOUS

## 2023-06-11 MED ORDER — POTASSIUM CHLORIDE CRYS ER 20 MEQ PO TBCR: 40.0000 meq | EXTENDED_RELEASE_TABLET | Freq: Once | ORAL | Status: DC

## 2023-06-11 MED ORDER — POTASSIUM CHLORIDE 10 MEQ/100ML IV SOLN
10.0000 meq | INTRAVENOUS | Status: AC
  Administered 2023-06-11 (×4): 10 meq via INTRAVENOUS
  Filled 2023-06-11 (×2): qty 100

## 2023-06-11 MED ORDER — SCOPOLAMINE 1 MG/3DAYS TD PT72
1.0000 | MEDICATED_PATCH | TRANSDERMAL | Status: DC
  Administered 2023-06-11: 1.5 mg via TRANSDERMAL
  Filled 2023-06-11: qty 1

## 2023-06-11 MED ORDER — DIPHENHYDRAMINE HCL 50 MG/ML IJ SOLN
12.5000 mg | Freq: Once | INTRAMUSCULAR | Status: AC
  Administered 2023-06-11: 12.5 mg via INTRAVENOUS
  Filled 2023-06-11: qty 1

## 2023-06-11 MED ORDER — PROMETHAZINE HCL 25 MG/ML IJ SOLN
12.5000 mg | Freq: Once | INTRAMUSCULAR | Status: AC
  Administered 2023-06-11: 12.5 mg via INTRAMUSCULAR
  Filled 2023-06-11: qty 1

## 2023-06-11 MED ORDER — POTASSIUM CHLORIDE 20 MEQ PO PACK
60.0000 meq | PACK | ORAL | Status: AC
  Administered 2023-06-11: 60 meq via ORAL
  Filled 2023-06-11: qty 3

## 2023-06-11 NOTE — ED Provider Notes (Signed)
 [place] EMERGENCY DEPARTMENT AT [facility] Provider Note   CSN: [id number] Arrival date & time: 06/11/23  4098     History {Add pertinent medical, surgical, social history, OB history to HPI:1} Chief Complaint  Patient presents with   Emesis    Priscilla Castillo is a 33 y.o. female.  33 year old female with a history of marijuana use who presents to the emergency department with nausea and vomiting.  Several days ago started having nausea and vomiting.  Also has had diffuse constant abdominal pains.  Has noted some brown chunks in her emesis.  Last bowel movement several days ago.  Thinks she is still passing gas.  No diarrhea.  Has this happen occasionally and tried smoking marijuana to calm her abdominal pain and take medication that she was prescribed but did not relieve her symptoms.       Home Medications Prior to Admission medications   Medication Sig Start Date End Date Taking? Authorizing Provider  ARIPiprazole (ABILIFY) 5 MG tablet Take 5 mg by mouth at bedtime. 12/02/22   [provider]  cloNIDine (CATAPRES) 0.1 MG tablet Take 0.1 mg by mouth daily. 09/20/22   [provider]  diazepam  (VALIUM ) 10 MG tablet TAKE 1 TABLET BY MOUTH ONE HOUR PRIOR TO MRI IMAGING. YOU MUST HAVE DRIVER. 02/13/12   Pleasant Brilliant, MD  hydrochlorothiazide  (HYDRODIURIL ) 12.5 MG tablet Take 1 tablet (12.5 mg total) by mouth daily. 03/13/21   Adolph Hoop, PA-C  potassium chloride  SA (KLOR-CON  M) 20 MEQ tablet Take 1 tablet (20 mEq total) by mouth 2 (two) times daily. 02/09/23   Triplett, Tammy, PA-C  predniSONE  (STERAPRED UNI-PAK 21 TAB) 5 MG (21) TBPK tablet Take 6 pills first day; 5 pills second day; 4 pills third day; 3 pills fourth day; 2 pills next day and 1 pill last day. 04/20/23   Pleasant Brilliant, MD  promethazine  (PHENERGAN ) 25 MG suppository Place 1 suppository (25 mg total) rectally every 6 (six) hours as needed for nausea or vomiting. 02/09/23   Triplett, Tammy,  PA-C  SUBOXONE 8-2 MG FILM Place under the tongue 4 (four) times daily. 09/21/22   [provider]  traZODone (DESYREL) 50 MG tablet Take 50 mg by mouth at bedtime as needed. 12/02/22   [provider]     Past Surgical History:  Procedure Laterality Date   CESAREAN SECTION     x 2   CHOLECYSTECTOMY N/A 10/20/2020   Procedure: LAPAROSCOPIC CHOLECYSTECTOMY;  Surgeon: Alanda Allegra, MD;  Location: [facility];  Service: General;  Laterality: N/A;   WISDOM TOOTH EXTRACTION      Allergies    Shellfish allergy, Banana, Latex, and Nsaids    Review of Systems   Review of Systems  Physical Exam Updated Vital Signs BP (!) 162/97   Pulse 85   Temp 98.6 F (37 C)   Resp 18   Ht 5\' 1"  (1.549 m)   Wt 90.3 kg   SpO2 100%   BMI 37.62 kg/m  Physical Exam Vitals and nursing note reviewed.  Constitutional:      General: She is not in acute distress.    Appearance: She is well-developed.     Comments: Occasionally vomiting during the exam.  HENT:     Head: Normocephalic and atraumatic.     Right Ear: External ear normal.     Left Ear: External ear normal.     Nose: Nose normal.  Eyes:     Extraocular Movements: Extraocular movements intact.  Conjunctiva/sclera: Conjunctivae normal.     Pupils: Pupils are equal, round, and reactive to light.  Pulmonary:     Effort: Pulmonary effort is normal. No respiratory distress.  Abdominal:     General: Abdomen is flat. There is no distension.     Palpations: Abdomen is soft. There is no mass.     Tenderness: There is abdominal tenderness (Minimal, diffuse). There is no guarding.  Musculoskeletal:     Cervical back: Normal range of motion and neck supple.  Skin:    General: Skin is warm and dry.  Neurological:     Mental Status: She is alert and oriented to person, place, and time. Mental status is at baseline.  Psychiatric:        Mood and Affect: Mood normal.     ED Results / Procedures / Treatments   Labs (all labs  ordered are listed, but only abnormal results are displayed) Labs Reviewed  CBC - Abnormal; Notable for the following components:      Result Value   WBC 11.9 (*)    Hemoglobin 15.8 (*)    All other components within normal limits  LIPASE, BLOOD  COMPREHENSIVE METABOLIC PANEL WITH GFR  URINALYSIS, ROUTINE W REFLEX MICROSCOPIC  POC URINE PREG, ED    EKG None  Radiology No results found.  Procedures Procedures  {Document cardiac monitor, telemetry assessment procedure when appropriate:1}  Medications Ordered in ED Medications  ondansetron  (ZOFRAN ) injection 4 mg (has no administration in time range)  promethazine  (PHENERGAN ) 12.5 mg in sodium chloride  0.9 % 50 mL IVPB (has no administration in time range)  HYDROmorphone  (DILAUDID ) injection 0.5 mg (has no administration in time range)  lactated ringers  bolus 1,000 mL (has no administration in time range)    ED Course/ Medical Decision Making/ A&P   {   Click here for ABCD2, HEART and other calculatorsREFRESH Note before signing :1}                              Medical Decision Making Amount and/or Complexity of Data Reviewed Labs: ordered.  Risk Prescription drug management.   ***  {Document critical care time when appropriate:1} {Document review of labs and clinical decision tools ie heart score, Chads2Vasc2 etc:1}  {Document your independent review of radiology images, and any outside records:1} {Document your discussion with family members, caretakers, and with consultants:1} {Document social determinants of health affecting pt's care:1} {Document your decision making why or why not admission, treatments were needed:1} Final Clinical Impression(s) / ED Diagnoses Final diagnoses:  None    Rx / DC Orders ED Discharge Orders     None

## 2023-06-11 NOTE — Progress Notes (Signed)
 Addendum  -serum HCG positive -patient informed -transvaginal US  ordered STAT -I have informed radiology dept to call in technician -continue IVF, anti-emetics, IVF, pantoprazole  -start scopolamine  patch  DTat

## 2023-06-11 NOTE — H&P (Signed)
 History and Physical    Patient: Priscilla Castillo ZOX:[id number] DOB: 24-Jul-1990 DOA: 06/11/2023 DOS: the patient was seen and examined on 06/11/2023 PCP: Alston Jerry, MD  Patient coming from: Home  Chief Complaint:  Chief Complaint  Patient presents with   Emesis   HPI: Priscilla Castillo is a 33 year old female with history of hypertension and GERD presenting with 3 to 4-day history of nausea, vomiting, and diffuse abdominal pain.  She has had some subjective fevers and chills.  She denies any chest pain or shortness of breath although she has some shortness of breath with emesis.  She denies any hematemesis, diarrhea, hematochezia, melena.  She states that she has been smoking marijuana, although she states that she does not use it regularly her last marijuana use was on 06/10/2023. She denies any dysuria or hematuria.  The patient was in the ED about 2 days prior to this admission, but she left without being seen.  Notably, the patient states that she may be pregnant.  She feels that she may be around [redacted] weeks pregnant.  She states that she has not had any prenatal care.  She stated that she intends to terminate her pregnancy in the near future.  In the ED, the patient was afebrile and hemodynamically stable with oxygen saturation 99% room air.  She was given Dilaudid  0.5 g x 1.  She was given promethazine  x 2 and: 10 mg x 1.  She continued to have dry heaving.  Blood work showed sodium 130, potassium 2.6, bicarbonate 23, serum creatinine 0.90.  LFTs unremarkable.  Lipase 27.  WBC 11.9, hemoglobin 15.8, platelets 284.  Because of her electrolyte abnormalities and continued dry heaving, admission was requested.  Review of Systems: As mentioned in the history of present illness. All other systems reviewed and are negative. Past Medical History:  Diagnosis Date   GERD (gastroesophageal reflux disease)    Hypertension    Pelvic inflammatory disease (PID)    Past Surgical History:   Procedure Laterality Date   CESAREAN SECTION     x 2   CHOLECYSTECTOMY N/A 10/20/2020   Procedure: LAPAROSCOPIC CHOLECYSTECTOMY;  Surgeon: Alanda Allegra, MD;  Location: [facility];  Service: General;  Laterality: N/A;   WISDOM TOOTH EXTRACTION     Social History:  reports that she has been smoking cigarettes. She has a 5 pack-year smoking history. She has been exposed to tobacco smoke. She has never used smokeless tobacco. She reports current alcohol use of about 2.0 standard drinks of alcohol per week. She reports current drug use. Drug: Marijuana.  Allergies  Allergen Reactions   Shellfish Allergy Anaphylaxis   Banana Hives and Swelling   Latex Itching   Nsaids Hives    History reviewed. No pertinent family history.  Prior to Admission medications   Medication Sig Start Date End Date Taking? Authorizing Provider  ARIPiprazole (ABILIFY) 5 MG tablet Take 5 mg by mouth at bedtime. 12/02/22   [provider]  cloNIDine (CATAPRES) 0.1 MG tablet Take 0.1 mg by mouth daily. 09/20/22   [provider]  diazepam  (VALIUM ) 10 MG tablet TAKE 1 TABLET BY MOUTH ONE HOUR PRIOR TO MRI IMAGING. YOU MUST HAVE DRIVER. 09/04/89   Pleasant Brilliant, MD  hydrochlorothiazide  (HYDRODIURIL ) 12.5 MG tablet Take 1 tablet (12.5 mg total) by mouth daily. 03/13/21   Adolph Hoop, PA-C  potassium chloride  SA (KLOR-CON  M) 20 MEQ tablet Take 1 tablet (20 mEq total) by mouth 2 (two) times daily. 02/09/23  Triplett, Tammy, PA-C  predniSONE  (STERAPRED UNI-PAK 21 TAB) 5 MG (21) TBPK tablet Take 6 pills first day; 5 pills second day; 4 pills third day; 3 pills fourth day; 2 pills next day and 1 pill last day. 04/20/23   Pleasant Brilliant, MD  promethazine  (PHENERGAN ) 25 MG suppository Place 1 suppository (25 mg total) rectally every 6 (six) hours as needed for nausea or vomiting. 02/09/23   Triplett, Tammy, PA-C  SUBOXONE 8-2 MG FILM Place under the tongue 4 (four) times daily. 09/21/22   [provider]   traZODone (DESYREL) 50 MG tablet Take 50 mg by mouth at bedtime as needed. 12/02/22   [provider]    Physical Exam: Vitals:   06/11/23 1100 06/11/23 1115 06/11/23 1130 06/11/23 1246  BP: (!) 149/91 117/68 (!) 102/52 110/67  Pulse: 91 84 83 79  Resp: 16 14 17 16   Temp:    98.3 F (36.8 C)  TempSrc:    Oral  SpO2: 97% 99% 99% 99%  Weight:    89.4 kg  Height:       GENERAL:  A&O x 3, NAD, well developed, cooperative, follows commands HEENT: [place]/AT, No thrush, No icterus, No oral ulcers Neck:  No neck mass, No meningismus, soft, supple CV: RRR, no S3, no S4, no rub, no JVD Lungs:  CTA, no wheeze, no rhonchi, good air movement Abd: soft/diffusely tender +BS, nondistended Ext: No edema, no lymphangitis, no cyanosis, no rashes Neuro:  CN II-XII intact, strength 4/5 in RUE, RLE, strength 4/5 LUE, LLE; sensation intact bilateral; no dysmetria; babinski equivocal  Data Reviewed: Data reviewed above in the history  Assessment and Plan: Intractable nausea and vomiting -Secondary to cannabis hyperemesis syndrome\ - Certainly, if the patient was pregnant this may be related to hyperemesis gravidarum - Start around-the-clock antiemetic - Start pantoprazole   Essential hypertension - Holding HCTZ secondary to soft BPs  Hypokalemia - Replace - Add potassium to IV fluids  Hyponatremia - Secondary to vomiting and hydrochlorothiazide  - Start IV fluids  Possible pregnancy - Check serum hCG  Opioid dependence - PDMP reviewed - Buprenorphine 8 mg, #14, filled 05/26/2023 - Suboxone 8/2, #14, filled 05/18/2023 - Suboxone 8/2, #14, filled 05/03/23 - Patient denies using any opioids or Suboxone   Advance Care Planning: FULL  Consults: none  Family Communication: none  Severity of Illness: The appropriate patient status for this patient is OBSERVATION. Observation status is judged to be reasonable and necessary in order to provide the required intensity of service to ensure  the patient's safety. The patient's presenting symptoms, physical exam findings, and initial radiographic and laboratory data in the context of their medical condition is felt to place them at decreased risk for further clinical deterioration. Furthermore, it is anticipated that the patient will be medically stable for discharge from the hospital within 2 midnights of admission.   Author: Demaris Fillers, MD 06/11/2023 3:05 PM  For on call review [email or web address].

## 2023-06-11 NOTE — ED Triage Notes (Signed)
 Pov from home. Cc of emesis. Was here on the 15th fro the same.

## 2023-06-11 NOTE — ED Notes (Signed)
 Attempted call report to [phone].

## 2023-06-11 NOTE — Progress Notes (Signed)
 Refused covid test Dr. Winferd Hatter aware

## 2023-06-11 NOTE — ED Notes (Signed)
 Phenergan  to be prepared by pharmacy.

## 2023-06-11 NOTE — ED Notes (Signed)
 Pt states stomach is hurting again and states she  feels nauseated. Pt states she can not swallow oral medicines at this time. EDP notified.

## 2023-06-11 NOTE — Hospital Course (Signed)
 33 year old female with history of hypertension and GERD presenting with 3 to 4-day history of nausea, vomiting, and diffuse abdominal pain.  She has had some subjective fevers and chills.  She denies any chest pain or shortness of breath although she has some shortness of breath with emesis.  She denies any hematemesis, diarrhea, hematochezia, melena.  She states that she has been smoking marijuana, although she states that she does not use it regularly her last marijuana use was on 06/10/2023. She denies any dysuria or hematuria.  The patient was in the ED about 2 days prior to this admission, but she left without being seen.  Notably, the patient states that she may be pregnant.  She feels that she may be around [redacted] weeks pregnant.  She states that she has not had any prenatal care.  She stated that she intends to terminate her pregnancy in the near future.  In the ED, the patient was afebrile and hemodynamically stable with oxygen saturation 99% room air.  She was given Dilaudid  0.5 g x 1.  She was given promethazine  x 2 and: 10 mg x 1.  She continued to have dry heaving.  Blood work showed sodium 130, potassium 2.6, bicarbonate 23, serum creatinine 0.90.  LFTs unremarkable.  Lipase 27.  WBC 11.9, hemoglobin 15.8, platelets 284.  Because of her electrolyte abnormalities and continued dry heaving, admission was requested.

## 2023-06-11 NOTE — ED Notes (Signed)
 Requested urine sample from pt. Pt states she is unable to give sample at this time. Will reassess when fluid bolus completed.

## 2023-06-12 DIAGNOSIS — O99321 Drug use complicating pregnancy, first trimester: Secondary | ICD-10-CM | POA: Diagnosis present

## 2023-06-12 DIAGNOSIS — O219 Vomiting of pregnancy, unspecified: Secondary | ICD-10-CM | POA: Diagnosis present

## 2023-06-12 DIAGNOSIS — O468X1 Other antepartum hemorrhage, first trimester: Secondary | ICD-10-CM | POA: Diagnosis present

## 2023-06-12 DIAGNOSIS — F17210 Nicotine dependence, cigarettes, uncomplicated: Secondary | ICD-10-CM | POA: Diagnosis present

## 2023-06-12 DIAGNOSIS — Z91040 Latex allergy status: Secondary | ICD-10-CM | POA: Diagnosis not present

## 2023-06-12 DIAGNOSIS — F1120 Opioid dependence, uncomplicated: Secondary | ICD-10-CM | POA: Diagnosis present

## 2023-06-12 DIAGNOSIS — O99331 Smoking (tobacco) complicating pregnancy, first trimester: Secondary | ICD-10-CM | POA: Diagnosis present

## 2023-06-12 DIAGNOSIS — O210 Mild hyperemesis gravidarum: Secondary | ICD-10-CM | POA: Insufficient documentation

## 2023-06-12 DIAGNOSIS — O161 Unspecified maternal hypertension, first trimester: Secondary | ICD-10-CM | POA: Diagnosis present

## 2023-06-12 DIAGNOSIS — E871 Hypo-osmolality and hyponatremia: Secondary | ICD-10-CM | POA: Diagnosis present

## 2023-06-12 DIAGNOSIS — Z91013 Allergy to seafood: Secondary | ICD-10-CM | POA: Diagnosis not present

## 2023-06-12 DIAGNOSIS — O99281 Endocrine, nutritional and metabolic diseases complicating pregnancy, first trimester: Secondary | ICD-10-CM | POA: Diagnosis present

## 2023-06-12 DIAGNOSIS — Z3490 Encounter for supervision of normal pregnancy, unspecified, unspecified trimester: Secondary | ICD-10-CM | POA: Diagnosis not present

## 2023-06-12 DIAGNOSIS — Z886 Allergy status to analgesic agent status: Secondary | ICD-10-CM | POA: Diagnosis not present

## 2023-06-12 DIAGNOSIS — K219 Gastro-esophageal reflux disease without esophagitis: Secondary | ICD-10-CM | POA: Diagnosis present

## 2023-06-12 DIAGNOSIS — E876 Hypokalemia: Secondary | ICD-10-CM | POA: Diagnosis present

## 2023-06-12 DIAGNOSIS — O99611 Diseases of the digestive system complicating pregnancy, first trimester: Secondary | ICD-10-CM | POA: Diagnosis present

## 2023-06-12 DIAGNOSIS — F1290 Cannabis use, unspecified, uncomplicated: Secondary | ICD-10-CM | POA: Diagnosis present

## 2023-06-12 DIAGNOSIS — Z79899 Other long term (current) drug therapy: Secondary | ICD-10-CM | POA: Diagnosis not present

## 2023-06-12 DIAGNOSIS — Z3A01 Less than 8 weeks gestation of pregnancy: Secondary | ICD-10-CM | POA: Diagnosis not present

## 2023-06-12 DIAGNOSIS — I10 Essential (primary) hypertension: Secondary | ICD-10-CM | POA: Diagnosis not present

## 2023-06-12 DIAGNOSIS — R1110 Vomiting, unspecified: Secondary | ICD-10-CM | POA: Diagnosis not present

## 2023-06-12 DIAGNOSIS — R102 Pelvic and perineal pain: Secondary | ICD-10-CM | POA: Diagnosis present

## 2023-06-12 DIAGNOSIS — Z91018 Allergy to other foods: Secondary | ICD-10-CM | POA: Diagnosis not present

## 2023-06-12 LAB — BILIRUBIN, FRACTIONATED(TOT/DIR/INDIR)
Bilirubin, Direct: 0.3 mg/dL — ABNORMAL HIGH (ref 0.0–0.2)
Indirect Bilirubin: 1.5 mg/dL — ABNORMAL HIGH (ref 0.3–0.9)
Total Bilirubin: 1.8 mg/dL — ABNORMAL HIGH (ref 0.0–1.2)

## 2023-06-12 LAB — CBC
HCT: 38.3 % (ref 36.0–46.0)
Hemoglobin: 12.8 g/dL (ref 12.0–15.0)
MCH: 30.6 pg (ref 26.0–34.0)
MCHC: 33.4 g/dL (ref 30.0–36.0)
MCV: 91.6 fL (ref 80.0–100.0)
Platelets: 228 10*3/uL (ref 150–400)
RBC: 4.18 MIL/uL (ref 3.87–5.11)
RDW: 12.1 % (ref 11.5–15.5)
WBC: 12 10*3/uL — ABNORMAL HIGH (ref 4.0–10.5)
nRBC: 0 % (ref 0.0–0.2)

## 2023-06-12 LAB — MAGNESIUM: Magnesium: 2 mg/dL (ref 1.7–2.4)

## 2023-06-12 LAB — BASIC METABOLIC PANEL WITH GFR
Anion gap: 7 (ref 5–15)
BUN: 6 mg/dL (ref 6–20)
CO2: 20 mmol/L — ABNORMAL LOW (ref 22–32)
Calcium: 8.1 mg/dL — ABNORMAL LOW (ref 8.9–10.3)
Chloride: 103 mmol/L (ref 98–111)
Creatinine, Ser: 0.72 mg/dL (ref 0.44–1.00)
GFR, Estimated: >60 mL/min (ref >60)
Glucose, Bld: 81 mg/dL (ref 70–99)
Potassium: 2.9 mmol/L — ABNORMAL LOW (ref 3.5–5.1)
Sodium: 130 mmol/L — ABNORMAL LOW (ref 135–145)

## 2023-06-12 LAB — HIV ANTIBODY (ROUTINE TESTING W REFLEX): HIV Screen 4th Generation wRfx: NONREACTIVE

## 2023-06-12 MED ORDER — METOCLOPRAMIDE HCL 5 MG/ML IJ SOLN
5.0000 mg | Freq: Four times a day (QID) | INTRAMUSCULAR | Status: AC
  Administered 2023-06-12 – 2023-06-13 (×4): 5 mg via INTRAVENOUS
  Filled 2023-06-12 (×4): qty 2

## 2023-06-12 MED ORDER — POTASSIUM CHLORIDE 10 MEQ/100ML IV SOLN
10.0000 meq | INTRAVENOUS | Status: AC
Start: 2023-06-12 — End: 2023-06-13
  Administered 2023-06-12 (×6): 10 meq via INTRAVENOUS
  Filled 2023-06-12 (×6): qty 100

## 2023-06-12 NOTE — Plan of Care (Signed)

## 2023-06-12 NOTE — Plan of Care (Signed)
  Problem: Education: Goal: Knowledge of General Education information will improve Description: Including pain rating scale, medication(s)/side effects and non-pharmacologic comfort measures 06/12/2023 1801 by Billey Budds, RN Outcome: Progressing 06/12/2023 1801 by Billey Budds, RN Outcome: Progressing   Problem: Health Behavior/Discharge Planning: Goal: Ability to manage health-related needs will improve 06/12/2023 1801 by Billey Budds, RN Outcome: Progressing 06/12/2023 1801 by Billey Budds, RN Outcome: Progressing   Problem: Clinical Measurements: Goal: Ability to maintain clinical measurements within normal limits will improve 06/12/2023 1801 by Billey Budds, RN Outcome: Progressing 06/12/2023 1801 by Billey Budds, RN Outcome: Progressing Goal: Will remain free from infection 06/12/2023 1801 by Billey Budds, RN Outcome: Progressing 06/12/2023 1801 by Billey Budds, RN Outcome: Progressing Goal: Diagnostic test results will improve 06/12/2023 1801 by Billey Budds, RN Outcome: Progressing 06/12/2023 1801 by Billey Budds, RN Outcome: Progressing Goal: Respiratory complications will improve 06/12/2023 1801 by Billey Budds, RN Outcome: Progressing 06/12/2023 1801 by Billey Budds, RN Outcome: Progressing Goal: Cardiovascular complication will be avoided 06/12/2023 1801 by Billey Budds, RN Outcome: Progressing 06/12/2023 1801 by Billey Budds, RN Outcome: Progressing   Problem: Activity: Goal: Risk for activity intolerance will decrease 06/12/2023 1801 by Billey Budds, RN Outcome: Progressing 06/12/2023 1801 by Billey Budds, RN Outcome: Progressing   Problem: Nutrition: Goal: Adequate nutrition will be maintained 06/12/2023 1801 by Billey Budds, RN Outcome: Progressing 06/12/2023 1801 by Billey Budds, RN Outcome: Progressing   Problem: Coping: Goal: Level of anxiety will decrease 06/12/2023 1801 by Billey Budds, RN Outcome:  Progressing 06/12/2023 1801 by Billey Budds, RN Outcome: Progressing   Problem: Elimination: Goal: Will not experience complications related to bowel motility 06/12/2023 1801 by Billey Budds, RN Outcome: Progressing 06/12/2023 1801 by Billey Budds, RN Outcome: Progressing Goal: Will not experience complications related to urinary retention 06/12/2023 1801 by Billey Budds, RN Outcome: Progressing 06/12/2023 1801 by Billey Budds, RN Outcome: Progressing   Problem: Pain Managment: Goal: General experience of comfort will improve and/or be controlled 06/12/2023 1801 by Billey Budds, RN Outcome: Progressing 06/12/2023 1801 by Billey Budds, RN Outcome: Progressing   Problem: Safety: Goal: Ability to remain free from injury will improve 06/12/2023 1801 by Billey Budds, RN Outcome: Progressing 06/12/2023 1801 by Billey Budds, RN Outcome: Progressing   Problem: Skin Integrity: Goal: Risk for impaired skin integrity will decrease Outcome: Progressing

## 2023-06-12 NOTE — Progress Notes (Signed)
 PROGRESS NOTE  Priscilla Castillo ZOX:[id number] DOB: [birth year] DOA: 06/11/2023 PCP: Alston Jerry, MD  Brief History:  33 year old female with history of hypertension and GERD presenting with 3 to 4-day history of nausea, vomiting, and diffuse abdominal pain.  She has had some subjective fevers and chills.  She denies any chest pain or shortness of breath although she has some shortness of breath with emesis.  She denies any hematemesis, diarrhea, hematochezia, melena.  She states that she has been smoking marijuana, although she states that she does not use it regularly her last marijuana use was on 06/10/2023. She denies any dysuria or hematuria.  The patient was in the ED about 2 days prior to this admission, but she left without being seen.  Notably, the patient states that she may be pregnant.  She feels that she may be around [redacted] weeks pregnant.  She states that she has not had any prenatal care.  She stated that she intends to terminate her pregnancy in the near future.  In the ED, the patient was afebrile and hemodynamically stable with oxygen saturation 99% room air.  She was given Dilaudid  0.5 g x 1.  She was given promethazine  x 2 and: 10 mg x 1.  She continued to have dry heaving.  Blood work showed sodium 130, potassium 2.6, bicarbonate 23, serum creatinine 0.90.  LFTs unremarkable.  Lipase 27.  WBC 11.9, hemoglobin 15.8, platelets 284.  Because of her electrolyte abnormalities and continued dry heaving, admission was requested.   Assessment/Plan: Intractable nausea and vomiting -Secondary to cannabis hyperemesis syndrome and pregnancy - Continue around-the-clock antiemetic - Continue pantoprazole  -Add IV Pepcid  -Add scopolamine  - Patient continues to have intermittent dry heaves and nausea--start clear liquid diet  Intrauterine pregnancy -06/11/23 US --IUP 7 weeks, 1 day  Sub chorionic hemorrhage -Admitted on pelvic ultrasound -Discussed with GYN>> supportive  care -May be contributing to patient's pelvic pain   Essential hypertension - Holding HCTZ secondary to soft BPs and hyponatremia   Hypokalemia - Replace - Add potassium to IV fluids   Hyponatremia - Secondary to vomiting and hydrochlorothiazide  - Continue IV fluids   Opioid dependence - PDMP reviewed - Buprenorphine 8 mg, #14, filled 05/26/2023 - Suboxone 8/2, #14, filled 05/18/2023 - Suboxone 8/2, #14, filled 05/03/23 - Patient denies using any opioids or Suboxone      Family Communication:  no  Family at bedside  Consultants:  GYN on phone  Code Status:  FULL   DVT Prophylaxis:  SCDs   Procedures: As Listed in Progress Note Above  Antibiotics: None        Subjective: Patient states that abdominal pain is little bit better.  She continues to have some dry heaving.  She denies any chest pain, shortness of breath, headache, neck pain.  She denies dysuria  Objective: Vitals:   06/11/23 1246 06/11/23 1643 06/11/23 2041 06/12/23 0510  BP: 110/67 (!) 153/103 132/71 (!) 155/94  Pulse: 79 (!) 57 69 69  Resp: 16  16 16   Temp: 98.3 F (36.8 C) 99.7 F (37.6 C) 99.1 F (37.3 C) 99.2 F (37.3 C)  TempSrc: Oral Oral Oral   SpO2: 99% 98% 100% 99%  Weight: 89.4 kg     Height:        Intake/Output Summary (Last 24 hours) at 06/12/2023 0857 Last data filed at 06/11/2023 1800 Gross per 24 hour  Intake 352.78 ml  Output --  Net 352.78 ml  Weight change: -0.941 kg Exam:  General:  Pt is alert, follows commands appropriately, not in acute distress HEENT: No icterus, No thrush, No neck mass, [place]/AT Cardiovascular: RRR, S1/S2, no rubs, no gallops Respiratory: CTA bilaterally, no wheezing, no crackles, no rhonchi Abdomen: Soft/+BS, non tender, non distended, no guarding Extremities: No edema, No lymphangitis, No petechiae, No rashes, no synovitis   Data Reviewed: I have personally reviewed following labs and imaging studies Basic Metabolic Panel: Recent Labs   Lab 06/09/23 1017 06/11/23 0720 06/12/23 0414  NA 136 130* 130*  K 3.0* 2.6* 2.9*  CL 102 95* 103  CO2 20* 23 20*  GLUCOSE 122* 99 81  BUN 14 10 6   CREATININE 1.08* 0.90 0.72  CALCIUM 10.0 9.4 8.1*  MG  --  2.1 2.0   Liver Function Tests: Recent Labs  Lab 06/09/23 1017 06/11/23 0720 06/12/23 0414  AST 14* 15  --   ALT 12 13  --   ALKPHOS 49 47  --   BILITOT 1.6* 2.2* 1.8*  PROT 8.4* 8.0  --   ALBUMIN 4.5 4.2  --    Recent Labs  Lab 06/09/23 1017 06/11/23 0720  LIPASE 26 27   No results for input(s): "AMMONIA" in the last 168 hours. Coagulation Profile: No results for input(s): "INR", "PROTIME" in the last 168 hours. CBC: Recent Labs  Lab 06/09/23 1017 06/11/23 0720 06/12/23 0414  WBC 9.7 11.9* 12.0*  NEUTROABS 7.5  --   --   HGB 15.4* 15.8* 12.8  HCT 43.4 44.3 38.3  MCV 90.2 89.9 91.6  PLT 310 284 228   Cardiac Enzymes: No results for input(s): "CKTOTAL", "CKMB", "CKMBINDEX", "TROPONINI" in the last 168 hours. BNP: Invalid input(s): "POCBNP" CBG: No results for input(s): "GLUCAP" in the last 168 hours. HbA1C: No results for input(s): "HGBA1C" in the last 72 hours. Urine analysis:    Component Value Date/Time   COLORURINE YELLOW 06/11/2023 1814   APPEARANCEUR CLEAR 06/11/2023 1814   LABSPEC 1.019 06/11/2023 1814   PHURINE 6.0 06/11/2023 1814   GLUCOSEU NEGATIVE 06/11/2023 1814   HGBUR NEGATIVE 06/11/2023 1814   BILIRUBINUR NEGATIVE 06/11/2023 1814   KETONESUR 80 (A) 06/11/2023 1814   PROTEINUR NEGATIVE 06/11/2023 1814   UROBILINOGEN 0.2 08/21/2013 1044   NITRITE NEGATIVE 06/11/2023 1814   LEUKOCYTESUR MODERATE (A) 06/11/2023 1814   Sepsis Labs: @LABRCNTIP (procalcitonin:4,lacticidven:4) )No results found for this or any previous visit (from the past 240 hours).   Scheduled Meds:  acetaminophen   1,000 mg Oral Once   heparin  5,000 Units Subcutaneous Q8H   metoCLOPramide  (REGLAN ) injection  5 mg Intravenous Q6H   pantoprazole  (PROTONIX )  IV  40 mg Intravenous Q12H   prenatal multivitamin  1 tablet Oral Q1200   prochlorperazine  10 mg Intravenous Q6H   pyridOXINE  100 mg Intravenous Daily   scopolamine   1 patch Transdermal Q72H   Continuous Infusions:  0.9 % NaCl with KCl 40 mEq / L 100 mL/hr at 06/11/23 1722   famotidine  (PEPCID ) IV 20 mg (06/11/23 2234)   potassium chloride       Procedures/Studies: US  OB LESS THAN 14 WEEKS WITH OB TRANSVAGINAL Result Date: 06/11/2023 CLINICAL DATA:  Pregnant patient with pelvic pain and vomiting. EXAM: OBSTETRIC <14 WK ULTRASOUND TECHNIQUE: Transabdominal ultrasound was performed for evaluation of the gestation as well as the maternal uterus and adnexal regions. COMPARISON:  None Available this pregnancy. FINDINGS: Intrauterine gestational sac: Single Yolk sac:  Visualized. Embryo:  Visualized. Cardiac Activity: Visualized. Heart Rate: 150  bpm CRL:   10.1 mm   7 w 1 d                  US  EDC: 01/27/2024 Subchorionic hemorrhage: Small to moderate, lateral and slightly inferior to the gestational sac. Maternal uterus/adnexae: Periphery vascular cyst in the right ovary is typical of a corpus luteum. Rounded adjacent echogenic structure measuring 2.4 cm is felt to represent contiguous ovarian tissue. The left ovary is normal in demonstrates normal flow. Small to moderate free fluid is simple in echogenicity. IMPRESSION: 1. Single live intrauterine pregnancy estimated gestational age [redacted] weeks 1 day for ultrasound [facility] 01/27/2024 2. Small to moderate subchorionic hemorrhage. 3. Corpus luteal cyst in the right ovary. Adjacent echogenic structure is favored to represent contiguous ovarian tissue. Recommend attention on follow-up exam. Electronically Signed   By: Chadwick Colonel M.D.   On: 06/11/2023 19:43    Demaris Fillers, DO  [facility]  If 7PM-7AM, please contact night-coverage [email or web address] Password [facility] 06/12/2023, 8:57 AM   LOS: 0 days

## 2023-06-12 NOTE — Progress Notes (Signed)
   06/12/23 0834  TOC Brief Assessment  Insurance and Status Reviewed  Patient has primary care physician Yes  Home environment has been reviewed Single Family Home  Prior level of function: Independent  Prior/Current Home Services No current home services  Social Drivers of Health Review SDOH reviewed no interventions necessary  Readmission risk has been reviewed Yes   Observation for Intractable vomiting.  Transition of Care Department [facility]) has reviewed patient, and no TOC needs have been identified at this time. We will continue to monitor patient advancement through interdisciplinary progressions rounds. If new patient transition needs arise, please place a TOC consult.

## 2023-06-13 DIAGNOSIS — Z3490 Encounter for supervision of normal pregnancy, unspecified, unspecified trimester: Secondary | ICD-10-CM | POA: Diagnosis not present

## 2023-06-13 DIAGNOSIS — E876 Hypokalemia: Secondary | ICD-10-CM | POA: Diagnosis not present

## 2023-06-13 DIAGNOSIS — R1110 Vomiting, unspecified: Secondary | ICD-10-CM | POA: Diagnosis not present

## 2023-06-13 LAB — MAGNESIUM: Magnesium: 1.9 mg/dL (ref 1.7–2.4)

## 2023-06-13 LAB — BASIC METABOLIC PANEL WITH GFR
Anion gap: 3 — ABNORMAL LOW (ref 5–15)
BUN: 5 mg/dL — ABNORMAL LOW (ref 6–20)
CO2: 21 mmol/L — ABNORMAL LOW (ref 22–32)
Calcium: 8.2 mg/dL — ABNORMAL LOW (ref 8.9–10.3)
Chloride: 108 mmol/L (ref 98–111)
Creatinine, Ser: 0.66 mg/dL (ref 0.44–1.00)
GFR, Estimated: >60 mL/min (ref >60)
Glucose, Bld: 81 mg/dL (ref 70–99)
Potassium: 3.8 mmol/L (ref 3.5–5.1)
Sodium: 132 mmol/L — ABNORMAL LOW (ref 135–145)

## 2023-06-13 MED ORDER — POTASSIUM CHLORIDE IN NACL 40-0.9 MEQ/L-% IV SOLN: INTRAVENOUS | Status: DC

## 2023-06-13 MED ORDER — METOCLOPRAMIDE HCL 5 MG/ML IJ SOLN
5.0000 mg | Freq: Four times a day (QID) | INTRAMUSCULAR | Status: DC
  Administered 2023-06-13 – 2023-06-14 (×4): 5 mg via INTRAVENOUS
  Filled 2023-06-13 (×4): qty 2

## 2023-06-13 MED ORDER — MAGNESIUM SULFATE IN D5W 1-5 GM/100ML-% IV SOLN
1.0000 g | Freq: Once | INTRAVENOUS | Status: AC
  Administered 2023-06-13: 1 g via INTRAVENOUS
  Filled 2023-06-13: qty 100

## 2023-06-13 NOTE — Plan of Care (Signed)

## 2023-06-13 NOTE — Progress Notes (Signed)
 PROGRESS NOTE  Priscilla Castillo HKV:[id number] DOB: [birth year] DOA: 06/11/2023 PCP: Alston Jerry, MD  Brief History:  33 year old female with history of hypertension and GERD presenting with 3 to 4-day history of nausea, vomiting, and diffuse abdominal pain.  She has had some subjective fevers and chills.  She denies any chest pain or shortness of breath although she has some shortness of breath with emesis.  She denies any hematemesis, diarrhea, hematochezia, melena.  She states that she has been smoking marijuana, although she states that she does not use it regularly her last marijuana use was on 06/10/2023. She denies any dysuria or hematuria.  The patient was in the ED about 2 days prior to this admission, but she left without being seen.  Notably, the patient states that she may be pregnant.  She feels that she may be around [redacted] weeks pregnant.  She states that she has not had any prenatal care.  She stated that she intends to terminate her pregnancy in the near future.  In the ED, the patient was afebrile and hemodynamically stable with oxygen saturation 99% room air.  She was given Dilaudid  0.5 g x 1.  She was given promethazine  x 2 and: 10 mg x 1.  She continued to have dry heaving.  Blood work showed sodium 130, potassium 2.6, bicarbonate 23, serum creatinine 0.90.  LFTs unremarkable.  Lipase 27.  WBC 11.9, hemoglobin 15.8, platelets 284.  Because of her electrolyte abnormalities and continued dry heaving, admission was requested.   Assessment/Plan: Intractable nausea and vomiting -Secondary to cannabis hyperemesis syndrome and pregnancy - Continue around-the-clock antiemetic - Continue pantoprazole  -Added IV Pepcid  -Added scopolamine  -Continue short course of metoclopramide  another 24 hours - Advance to full liquid diet today   Intrauterine pregnancy -06/11/23 US --IUP 7 weeks, 1 day   Sub chorionic hemorrhage -Admitted on pelvic ultrasound -Discussed with GYN>>  supportive care -May be contributing to patient's pelvic pain   Essential hypertension - Holding HCTZ secondary to soft BPs and hyponatremia   Hypokalemia - Replaced - Added potassium to IV fluids -Magnesium  1.9   Hyponatremia - Secondary to vomiting and hydrochlorothiazide  - Continue IV fluids>> improving   Opioid dependence - PDMP reviewed - Buprenorphine 8 mg, #14, filled 05/26/2023 - Suboxone 8/2, #14, filled 05/18/2023 - Suboxone 8/2, #14, filled 05/03/23 - Patient denies using any opioids or Suboxone           Family Communication:  no  Family at bedside   Consultants:  GYN on phone   Code Status:  FULL    DVT Prophylaxis:  SCDs     Procedures: As Listed in Progress Note Above   Antibiotics: None     Subjective: Patient states that she only had 1 episode of emesis in the last 24 hours.  She states that she is gradually improving.  She is passing flatus but no BM.  States that her pain is controlled.  No dysuria or hematuria.  Denies fevers and chills  Objective: Vitals:   06/12/23 1549 06/12/23 2035 06/13/23 0054 06/13/23 0347  BP: (!) 150/91 (!) 137/95 103/70 106/65  Pulse: 75 84 79 61  Resp: 16 18 18 18   Temp: 98.9 F (37.2 C) 98.8 F (37.1 C) 99.5 F (37.5 C) 98.9 F (37.2 C)  TempSrc: Oral Oral Oral Oral  SpO2: 97% 100% 100% 100%  Weight:      Height:        Intake/Output  Summary (Last 24 hours) at 06/13/2023 1215 Last data filed at 06/13/2023 0600 Gross per 24 hour  Intake 691.71 ml  Output 200 ml  Net 491.71 ml   Weight change:  Exam:  General:  Pt is alert, follows commands appropriately, not in acute distress HEENT: No icterus, No thrush, No neck mass, [place]/AT Cardiovascular: RRR, S1/S2, no rubs, no gallops Respiratory: CTA bilaterally, no wheezing, no crackles, no rhonchi Abdomen: Soft/+BS, non tender, non distended, no guarding Extremities: No edema, No lymphangitis, No petechiae, No rashes, no synovitis   Data Reviewed: I have  personally reviewed following labs and imaging studies Basic Metabolic Panel: Recent Labs  Lab 06/09/23 1017 06/11/23 0720 06/12/23 0414 06/13/23 0446  NA 136 130* 130* 132*  K 3.0* 2.6* 2.9* 3.8  CL 102 95* 103 108  CO2 20* 23 20* 21*  GLUCOSE 122* 99 81 81  BUN 14 10 6  5*  CREATININE 1.08* 0.90 0.72 0.66  CALCIUM 10.0 9.4 8.1* 8.2*  MG  --  2.1 2.0 1.9   Liver Function Tests: Recent Labs  Lab 06/09/23 1017 06/11/23 0720 06/12/23 0414  AST 14* 15  --   ALT 12 13  --   ALKPHOS 49 47  --   BILITOT 1.6* 2.2* 1.8*  PROT 8.4* 8.0  --   ALBUMIN 4.5 4.2  --    Recent Labs  Lab 06/09/23 1017 06/11/23 0720  LIPASE 26 27   No results for input(s): "AMMONIA" in the last 168 hours. Coagulation Profile: No results for input(s): "INR", "PROTIME" in the last 168 hours. CBC: Recent Labs  Lab 06/09/23 1017 06/11/23 0720 06/12/23 0414  WBC 9.7 11.9* 12.0*  NEUTROABS 7.5  --   --   HGB 15.4* 15.8* 12.8  HCT 43.4 44.3 38.3  MCV 90.2 89.9 91.6  PLT 310 284 228   Cardiac Enzymes: No results for input(s): "CKTOTAL", "CKMB", "CKMBINDEX", "TROPONINI" in the last 168 hours. BNP: Invalid input(s): "POCBNP" CBG: No results for input(s): "GLUCAP" in the last 168 hours. HbA1C: No results for input(s): "HGBA1C" in the last 72 hours. Urine analysis:    Component Value Date/Time   COLORURINE YELLOW 06/11/2023 1814   APPEARANCEUR CLEAR 06/11/2023 1814   LABSPEC 1.019 06/11/2023 1814   PHURINE 6.0 06/11/2023 1814   GLUCOSEU NEGATIVE 06/11/2023 1814   HGBUR NEGATIVE 06/11/2023 1814   BILIRUBINUR NEGATIVE 06/11/2023 1814   KETONESUR 80 (A) 06/11/2023 1814   PROTEINUR NEGATIVE 06/11/2023 1814   UROBILINOGEN 0.2 08/21/2013 1044   NITRITE NEGATIVE 06/11/2023 1814   LEUKOCYTESUR MODERATE (A) 06/11/2023 1814   Sepsis Labs: @LABRCNTIP (procalcitonin:4,lacticidven:4) )No results found for this or any previous visit (from the past 240 hours).   Scheduled Meds:  acetaminophen    1,000 mg Oral Once   heparin   5,000 Units Subcutaneous Q8H   metoCLOPramide  (REGLAN ) injection  5 mg Intravenous Q6H   pantoprazole  (PROTONIX ) IV  40 mg Intravenous Q12H   prenatal multivitamin  1 tablet Oral Q1200   prochlorperazine   10 mg Intravenous Q6H   pyridOXINE   100 mg Intravenous Daily   scopolamine   1 patch Transdermal Q72H   Continuous Infusions:  0.9 % NaCl with KCl 40 mEq / L     famotidine  (PEPCID ) IV 20 mg (06/13/23 0909)    Procedures/Studies: US  OB LESS THAN 14 WEEKS WITH OB TRANSVAGINAL Result Date: 06/11/2023 CLINICAL DATA:  Pregnant patient with pelvic pain and vomiting. EXAM: OBSTETRIC <14 WK ULTRASOUND TECHNIQUE: Transabdominal ultrasound was performed for evaluation of the gestation as  well as the maternal uterus and adnexal regions. COMPARISON:  None Available this pregnancy. FINDINGS: Intrauterine gestational sac: Single Yolk sac:  Visualized. Embryo:  Visualized. Cardiac Activity: Visualized. Heart Rate: 150 bpm CRL:   10.1 mm   7 w 1 d                  US  EDC: 01/27/2024 Subchorionic hemorrhage: Small to moderate, lateral and slightly inferior to the gestational sac. Maternal uterus/adnexae: Periphery vascular cyst in the right ovary is typical of a corpus luteum. Rounded adjacent echogenic structure measuring 2.4 cm is felt to represent contiguous ovarian tissue. The left ovary is normal in demonstrates normal flow. Small to moderate free fluid is simple in echogenicity. IMPRESSION: 1. Single live intrauterine pregnancy estimated gestational age [redacted] weeks 1 day for ultrasound [facility] 01/27/2024 2. Small to moderate subchorionic hemorrhage. 3. Corpus luteal cyst in the right ovary. Adjacent echogenic structure is favored to represent contiguous ovarian tissue. Recommend attention on follow-up exam. Electronically Signed   By: Chadwick Colonel M.D.   On: 06/11/2023 19:43    Demaris Fillers, DO  [facility]  If 7PM-7AM, please contact night-coverage [email or web address] Password  [email or web address] 06/13/2023, 12:15 PM   LOS: 1 day

## 2023-06-13 NOTE — Plan of Care (Signed)
 Pt is alert and oriented x 4. Up adlib. Vitals stable. Pt reports tenderness and severe abdominal pain. No vomiting this shift. Receiving scheduled reglan  and compazine . Pt has received 2 doses of dilaudid  so far this shift.  Problem: Education: Goal: Knowledge of General Education information will improve Description: Including pain rating scale, medication(s)/side effects and non-pharmacologic comfort measures Outcome: Progressing   Problem: Health Behavior/Discharge Planning: Goal: Ability to manage health-related needs will improve Outcome: Progressing   Problem: Clinical Measurements: Goal: Ability to maintain clinical measurements within normal limits will improve Outcome: Progressing Goal: Will remain free from infection Outcome: Progressing Goal: Diagnostic test results will improve Outcome: Progressing Goal: Respiratory complications will improve Outcome: Progressing Goal: Cardiovascular complication will be avoided Outcome: Progressing   Problem: Activity: Goal: Risk for activity intolerance will decrease Outcome: Progressing   Problem: Nutrition: Goal: Adequate nutrition will be maintained Outcome: Progressing   Problem: Coping: Goal: Level of anxiety will decrease Outcome: Progressing   Problem: Elimination: Goal: Will not experience complications related to bowel motility Outcome: Progressing Goal: Will not experience complications related to urinary retention Outcome: Progressing   Problem: Pain Managment: Goal: General experience of comfort will improve and/or be controlled Outcome: Progressing   Problem: Safety: Goal: Ability to remain free from injury will improve Outcome: Progressing   Problem: Skin Integrity: Goal: Risk for impaired skin integrity will decrease Outcome: Progressing

## 2023-06-14 DIAGNOSIS — Z3490 Encounter for supervision of normal pregnancy, unspecified, unspecified trimester: Secondary | ICD-10-CM | POA: Diagnosis not present

## 2023-06-14 DIAGNOSIS — E876 Hypokalemia: Secondary | ICD-10-CM | POA: Diagnosis not present

## 2023-06-14 DIAGNOSIS — R1110 Vomiting, unspecified: Secondary | ICD-10-CM | POA: Diagnosis not present

## 2023-06-14 LAB — BASIC METABOLIC PANEL WITH GFR
Anion gap: 5 (ref 5–15)
BUN: <5 mg/dL — ABNORMAL LOW (ref 6–20)
CO2: 19 mmol/L — ABNORMAL LOW (ref 22–32)
Calcium: 8.4 mg/dL — ABNORMAL LOW (ref 8.9–10.3)
Chloride: 109 mmol/L (ref 98–111)
Creatinine, Ser: 0.68 mg/dL (ref 0.44–1.00)
GFR, Estimated: >60 mL/min (ref >60)
Glucose, Bld: 85 mg/dL (ref 70–99)
Potassium: 4 mmol/L (ref 3.5–5.1)
Sodium: 133 mmol/L — ABNORMAL LOW (ref 135–145)

## 2023-06-14 LAB — MAGNESIUM: Magnesium: 2 mg/dL (ref 1.7–2.4)

## 2023-06-14 MED ORDER — PYRIDOXINE HCL 100 MG PO TABS: 100.0000 mg | ORAL_TABLET | Freq: Every day | ORAL | 0 refills | Status: AC

## 2023-06-14 MED ORDER — PANTOPRAZOLE SODIUM 40 MG PO TBEC: 40.0000 mg | DELAYED_RELEASE_TABLET | Freq: Two times a day (BID) | ORAL | Status: DC

## 2023-06-14 MED ORDER — VITAMIN B-6 100 MG PO TABS
100.0000 mg | ORAL_TABLET | Freq: Every day | ORAL | Status: DC
  Filled 2023-06-14: qty 1

## 2023-06-14 MED ORDER — PRENATAL MULTIVITAMIN CH: 1.0000 | ORAL_TABLET | Freq: Every day | ORAL | 1 refills | Status: AC

## 2023-06-14 MED ORDER — PANTOPRAZOLE SODIUM 40 MG PO TBEC: 40.0000 mg | DELAYED_RELEASE_TABLET | Freq: Two times a day (BID) | ORAL | 0 refills | Status: AC

## 2023-06-14 MED ORDER — SCOPOLAMINE 1 MG/3DAYS TD PT72: 1.0000 | MEDICATED_PATCH | TRANSDERMAL | 0 refills | Status: AC

## 2023-06-14 MED ORDER — ONDANSETRON 4 MG PO TBDP: 4.0000 mg | ORAL_TABLET | Freq: Three times a day (TID) | ORAL | 0 refills | Status: AC | PRN

## 2023-06-14 NOTE — Discharge Summary (Signed)
 Physician Discharge Summary   Patient: Priscilla Castillo MRN: [id number] DOB: 1990/09/23  Admit date:     06/11/2023  Discharge date: 06/14/23  Discharge Physician: Myrtie Atkinson Yunuen Mordan   PCP: Alston Jerry, MD   Recommendations at discharge:   Please follow up with primary care provider within 1-2 weeks  Please repeat BMP and CBC in one week   Hospital Course: 33 year old female with history of hypertension and GERD presenting with 3 to 4-day history of nausea, vomiting, and diffuse abdominal pain.  She has had some subjective fevers and chills.  She denies any chest pain or shortness of breath although she has some shortness of breath with emesis.  She denies any hematemesis, diarrhea, hematochezia, melena.  She states that she has been smoking marijuana, although she states that she does not use it regularly her last marijuana use was on 06/10/2023. She denies any dysuria or hematuria.  The patient was in the ED about 2 days prior to this admission, but she left without being seen.  Notably, the patient states that she may be pregnant.  She feels that she may be around [redacted] weeks pregnant.  She states that she has not had any prenatal care.  She stated that she intends to terminate her pregnancy in the near future.  In the ED, the patient was afebrile and hemodynamically stable with oxygen saturation 99% room air.  She was given Dilaudid  0.5 g x 1.  She was given promethazine  x 2 and: 10 mg x 1.  She continued to have dry heaving.  Blood work showed sodium 130, potassium 2.6, bicarbonate 23, serum creatinine 0.90.  LFTs unremarkable.  Lipase 27.  WBC 11.9, hemoglobin 15.8, platelets 284.  Because of her electrolyte abnormalities and continued dry heaving, admission was requested.  Assessment and Plan:  Intractable nausea and vomiting -initially placed on bowel rest and made npo and started IVF -Secondary to cannabis hyperemesis syndrome and pregnancy - Continue around-the-clock antiemetic -  Continue pantoprazole  -Added IV Pepcid  -Added scopolamine  -Continue short course of metoclopramide  another 24 hours - gradually advanced diet over next 48 hours to clears>full liquids>>soft diet which pt tolerated   Intrauterine pregnancy - transvaginal US >>NO ectopic pregnancy -06/11/23 US --IUP 7 weeks, 1 day -outpt follow up with OBGYN   Sub chorionic hemorrhage -Admitted on pelvic ultrasound -Discussed with GYN>> supportive care -May be contributing to patient's pelvic pain   Essential hypertension - Holding HCTZ secondary to soft BPs and hyponatremia - BP remained well controlled>>will not restart HCTZ   Hypokalemia - Replaced IV and po - Added potassium to IV fluids -Magnesium  2.0   Hyponatremia - Secondary to vomiting and hydrochlorothiazide  - Continue IV fluids>> improving - Na = 133 on day of dc   Opioid dependence - PDMP reviewed - Buprenorphine 8 mg, #14, filled 05/26/2023 - Suboxone 8/2, #14, filled 05/18/2023 - Suboxone 8/2, #14, filled 05/03/23 - Patient denies using any opioids or Suboxone       Consultants: GYN  Procedures performed: none  Disposition: Home Diet recommendation:  Regular diet DISCHARGE MEDICATION: Allergies as of 06/14/2023       Reactions   Shellfish Allergy Anaphylaxis   Banana Hives, Swelling   Latex Itching   Nsaids Hives        Medication List     STOP taking these medications    buprenorphine 8 MG Subl SL tablet Commonly known as: SUBUTEX   diazepam  10 MG tablet Commonly known as: VALIUM    hydrochlorothiazide  12.5 MG tablet  Commonly known as: HYDRODIURIL    sertraline 50 MG tablet Commonly known as: ZOLOFT   Suboxone 8-2 MG Film Generic drug: Buprenorphine HCl-Naloxone HCl       TAKE these medications    ondansetron  4 MG disintegrating tablet Commonly known as: ZOFRAN -ODT Take 1 tablet (4 mg total) by mouth every 8 (eight) hours as needed for nausea or vomiting.   pantoprazole  40 MG tablet Commonly  known as: PROTONIX  Take 1 tablet (40 mg total) by mouth 2 (two) times daily. Start taking on: Jun 15, 2023   prenatal multivitamin Tabs tablet Take 1 tablet by mouth daily at 12 noon.   pyridoxine  100 MG tablet Commonly known as: B-6 Take 1 tablet (100 mg total) by mouth daily. Start taking on: Jun 15, 2023   scopolamine  1 MG/3DAYS Commonly known as: TRANSDERM-SCOP Place 1 patch (1.5 mg total) onto the skin every 3 (three) days.        Discharge Exam: Filed Weights   06/11/23 0658 06/11/23 1246  Weight: 90.3 kg 89.4 kg   HEENT:  [place]/AT, No thrush, no icterus CV:  RRR, no rub, no S3, no S4 Lung:  CTA, no wheeze, no rhonchi Abd:  soft/+BS, NT Ext:  No edema, no lymphangitis, no synovitis, no rash   Condition at discharge: stable  The results of significant diagnostics from this hospitalization (including imaging, microbiology, ancillary and laboratory) are listed below for reference.   Imaging Studies: US  OB LESS THAN 14 WEEKS WITH OB TRANSVAGINAL Result Date: 06/11/2023 CLINICAL DATA:  Pregnant patient with pelvic pain and vomiting. EXAM: OBSTETRIC <14 WK ULTRASOUND TECHNIQUE: Transabdominal ultrasound was performed for evaluation of the gestation as well as the maternal uterus and adnexal regions. COMPARISON:  None Available this pregnancy. FINDINGS: Intrauterine gestational sac: Single Yolk sac:  Visualized. Embryo:  Visualized. Cardiac Activity: Visualized. Heart Rate: 150 bpm CRL:   10.1 mm   7 w 1 d                  US  EDC: 01/27/2024 Subchorionic hemorrhage: Small to moderate, lateral and slightly inferior to the gestational sac. Maternal uterus/adnexae: Periphery vascular cyst in the right ovary is typical of a corpus luteum. Rounded adjacent echogenic structure measuring 2.4 cm is felt to represent contiguous ovarian tissue. The left ovary is normal in demonstrates normal flow. Small to moderate free fluid is simple in echogenicity. IMPRESSION: 1. Single live intrauterine  pregnancy estimated gestational age [redacted] weeks 1 day for ultrasound [facility] 01/27/2024 2. Small to moderate subchorionic hemorrhage. 3. Corpus luteal cyst in the right ovary. Adjacent echogenic structure is favored to represent contiguous ovarian tissue. Recommend attention on follow-up exam. Electronically Signed   By: Chadwick Colonel M.D.   On: 06/11/2023 19:43    Microbiology: Results for orders placed or performed in visit on 07/01/22  Wound culture     Status: None   Collection Time: 07/01/22  2:00 PM   Specimen: Wound   BC  Result Value Ref Range Status   Gram Stain Result Final report  Final   Organism ID, Bacteria Comment  Final    Comment: Rare white blood cells.   Organism ID, Bacteria No organisms seen  Final   Aerobic Bacterial Culture Final report  Final   Organism ID, Bacteria Mixed skin flora  Final    Comment: Scant growth    Labs: CBC: Recent Labs  Lab 06/09/23 1017 06/11/23 0720 06/12/23 0414  WBC 9.7 11.9* 12.0*  NEUTROABS 7.5  --   --  HGB 15.4* 15.8* 12.8  HCT 43.4 44.3 38.3  MCV 90.2 89.9 91.6  PLT 310 284 228   Basic Metabolic Panel: Recent Labs  Lab 06/09/23 1017 06/11/23 0720 06/12/23 0414 06/13/23 0446 06/14/23 0505  NA 136 130* 130* 132* 133*  K 3.0* 2.6* 2.9* 3.8 4.0  CL 102 95* 103 108 109  CO2 20* 23 20* 21* 19*  GLUCOSE 122* 99 81 81 85  BUN 14 10 6  5* <5*  CREATININE 1.08* 0.90 0.72 0.66 0.68  CALCIUM 10.0 9.4 8.1* 8.2* 8.4*  MG  --  2.1 2.0 1.9 2.0   Liver Function Tests: Recent Labs  Lab 06/09/23 1017 06/11/23 0720 06/12/23 0414  AST 14* 15  --   ALT 12 13  --   ALKPHOS 49 47  --   BILITOT 1.6* 2.2* 1.8*  PROT 8.4* 8.0  --   ALBUMIN 4.5 4.2  --    CBG: No results for input(s): "GLUCAP" in the last 168 hours.  Discharge time spent: greater than 30 minutes.  Signed: Demaris Fillers, MD [facility] 06/14/2023

## 2023-06-14 NOTE — Plan of Care (Signed)

## 2023-06-14 NOTE — Plan of Care (Signed)

## 2023-06-14 NOTE — Progress Notes (Signed)
 Patient has discharge orders, discharge teaching given and no further questions at this time, patient taken down stairs to main entrance when transport was located.

## 2023-06-21 ENCOUNTER — Other Ambulatory Visit: Payer: Self-pay

## 2023-06-21 ENCOUNTER — Emergency Department (HOSPITAL_COMMUNITY)
Admission: EM | Admit: 2023-06-21 | Discharge: 2023-06-21 | Discharge status: Against Medical Advice | Attending: Emergency Medicine | Admitting: Emergency Medicine

## 2023-06-21 ENCOUNTER — Encounter (HOSPITAL_COMMUNITY): Payer: Self-pay

## 2023-06-21 DIAGNOSIS — Z5321 Procedure and treatment not carried out due to patient leaving prior to being seen by health care provider: Secondary | ICD-10-CM | POA: Diagnosis not present

## 2023-06-21 DIAGNOSIS — E876 Hypokalemia: Secondary | ICD-10-CM | POA: Insufficient documentation

## 2023-06-21 DIAGNOSIS — R109 Unspecified abdominal pain: Secondary | ICD-10-CM | POA: Insufficient documentation

## 2023-06-21 DIAGNOSIS — R112 Nausea with vomiting, unspecified: Secondary | ICD-10-CM | POA: Insufficient documentation

## 2023-06-21 NOTE — ED Triage Notes (Signed)
 Pt to er via ems, per ems pt was seen here two weeks for the same, states that she has abd pain and vomiting.  Pt states that she has been feeling poorly for the past few days, states that last time she was here her potassium was low

## 2023-07-05 ENCOUNTER — Other Ambulatory Visit: Payer: Self-pay | Admitting: Adult Health

## 2023-07-05 ENCOUNTER — Ambulatory Visit

## 2023-07-05 DIAGNOSIS — Z3A10 10 weeks gestation of pregnancy: Secondary | ICD-10-CM | POA: Diagnosis not present

## 2023-07-05 DIAGNOSIS — O3680X0 Pregnancy with inconclusive fetal viability, not applicable or unspecified: Secondary | ICD-10-CM

## 2023-07-05 NOTE — Progress Notes (Signed)
 US  10+4 wks,single IUP,FHR 161 bpm,posterior placenta,normal left ovary,2.5 x 2.1 x 2.9 cm echogenic mass with posterior shadowing right ovary (dermoid),CRL 39.24 mm

## 2023-07-06 ENCOUNTER — Ambulatory Visit: Admitting: Gastroenterology

## 2023-07-06 NOTE — Progress Notes (Deleted)
 GI Office Note    Referring Provider: Alston Jerry, MD Primary Care Physician:  Alston Jerry, MD  Primary Gastroenterologist:  Chief Complaint   No chief complaint on file.    History of Present Illness   Priscilla Castillo is a 33 y.o. female presenting today for recurrent N/V at the request of PA Suszanne Eriksson.   Recent admission last month for intractable N/V. Noted to be pregnant, [redacted]w[redacted]d by u/s. H/o opioid dependence. H/o marijuana use.  Cholecystecomy with gallstones 2022 did not help.     CT A/P with contrast 01/2023:  IMPRESSION: 1. No acute intra-abdominal or intrapelvic abnormality. 2. Status post cholecystectomy.  Medications   Current Outpatient Medications  Medication Sig Dispense Refill   ondansetron  (ZOFRAN -ODT) 4 MG disintegrating tablet Take 1 tablet (4 mg total) by mouth every 8 (eight) hours as needed for nausea or vomiting. 20 tablet 0   pantoprazole  (PROTONIX ) 40 MG tablet Take 1 tablet (40 mg total) by mouth 2 (two) times daily. 60 tablet 0   Prenatal Vit-Fe Fumarate-FA (PRENATAL MULTIVITAMIN) TABS tablet Take 1 tablet by mouth daily at 12 noon. 30 tablet 1   pyridOXINE  (B-6) 100 MG tablet Take 1 tablet (100 mg total) by mouth daily. 30 tablet 0   scopolamine  (TRANSDERM-SCOP) 1 MG/3DAYS Place 1 patch (1.5 mg total) onto the skin every 3 (three) days. 10 patch 0   No current facility-administered medications for this visit.    Allergies   Allergies as of 07/06/2023 - Review Complete 06/21/2023  Allergen Reaction Noted   Shellfish allergy Anaphylaxis 10/28/2012   Banana Hives and Swelling 08/30/2014   Latex Itching 06/11/2015   Nsaids Hives 08/03/2019    Past Medical History   Past Medical History:  Diagnosis Date   GERD (gastroesophageal reflux disease)    Hypertension    Pelvic inflammatory disease (PID)     Past Surgical History   Past Surgical History:  Procedure Laterality Date   CESAREAN SECTION     x 2    CHOLECYSTECTOMY N/A 10/20/2020   Procedure: LAPAROSCOPIC CHOLECYSTECTOMY;  Surgeon: Alanda Allegra, MD;  Location: [facility];  Service: General;  Laterality: N/A;   WISDOM TOOTH EXTRACTION      Past Family History   No family history on file.  Past Social History   Social History   Socioeconomic History   Marital status: Single    Spouse name: Not on file   Number of children: 3   Years of education: Not on file   Highest education level: Not on file  Occupational History   Not on file  Tobacco Use   Smoking status: Every Day    Current packs/day: 0.33    Average packs/day: 0.3 packs/day for 15.0 years (5.0 ttl pk-yrs)    Types: Cigarettes    Passive exposure: Never   Smokeless tobacco: Never  Vaping Use   Vaping status: Never Used  Substance and Sexual Activity   Alcohol use: Not Currently    Alcohol/week: 2.0 standard drinks of alcohol    Types: 2 Glasses of wine per week   Drug use: Not Currently    Types: Marijuana    Comment: medical   Sexual activity: Yes    Birth control/protection: None  Other Topics Concern   Not on file  Social History Narrative   Not on file   Social Drivers of Health   Financial Resource Strain: Not on file  Food Insecurity: No Food Insecurity (06/11/2023)  Hunger Vital Sign    Worried About Running Out of Food in the Last Year: Never true    Ran Out of Food in the Last Year: Never true  Transportation Needs: No Transportation Needs (06/11/2023)   PRAPARE - Administrator, Civil Service (Medical): No    Lack of Transportation (Non-Medical): No  Physical Activity: Not on file  Stress: Not on file  Social Connections: Not on file  Intimate Partner Violence: Not At Risk (06/11/2023)   Humiliation, Afraid, Rape, and Kick questionnaire    Fear of Current or Ex-Partner: No    Emotionally Abused: No    Physically Abused: No    Sexually Abused: No    Review of Systems   General: Negative for anorexia, weight loss, fever,  chills, fatigue, weakness. Eyes: Negative for vision changes.  ENT: Negative for hoarseness, difficulty swallowing , nasal congestion. CV: Negative for chest pain, angina, palpitations, dyspnea on exertion, peripheral edema.  Respiratory: Negative for dyspnea at rest, dyspnea on exertion, cough, sputum, wheezing.  GI: See history of present illness. GU:  Negative for dysuria, hematuria, urinary incontinence, urinary frequency, nocturnal urination.  MS: Negative for joint pain, low back pain.  Derm: Negative for rash or itching.  Neuro: Negative for weakness, abnormal sensation, seizure, frequent headaches, memory loss,  confusion.  Psych: Negative for anxiety, depression, suicidal ideation, hallucinations.  Endo: Negative for unusual weight change.  Heme: Negative for bruising or bleeding. Allergy: Negative for rash or hives.  Physical Exam   LMP  (LMP Unknown)    General: Well-nourished, well-developed in no acute distress.  Head: Normocephalic, atraumatic.   Eyes: Conjunctiva pink, no icterus. Mouth: Oropharyngeal mucosa moist and pink  Neck: Supple without thyromegaly, masses, or lymphadenopathy.  Lungs: Clear to auscultation bilaterally.  Heart: Regular rate and rhythm, no murmurs rubs or gallops.  Abdomen: Bowel sounds are normal, nontender, nondistended, no hepatosplenomegaly or masses,  no abdominal bruits or hernia, no rebound or guarding.   Rectal: not performed Extremities: No lower extremity edema. No clubbing or deformities.  Neuro: Alert and oriented x 4 , grossly normal neurologically.  Skin: Warm and dry, no rash or jaundice.   Psych: Alert and cooperative, normal mood and affect.  Labs   Lab Results  Component Value Date   NA 133 (L) 06/14/2023   CL 109 06/14/2023   K 4.0 06/14/2023   CO2 19 (L) 06/14/2023   BUN <5 (L) 06/14/2023   CREATININE 0.68 06/14/2023   GFRNONAA >60 06/14/2023   CALCIUM 8.4 (L) 06/14/2023   ALBUMIN 4.2 06/11/2023   GLUCOSE 85  06/14/2023   Lab Results  Component Value Date   ALT 13 06/11/2023   AST 15 06/11/2023   ALKPHOS 47 06/11/2023   BILITOT 1.8 (H) 06/12/2023   Lab Results  Component Value Date   WBC 12.0 (H) 06/12/2023   HGB 12.8 06/12/2023   HCT 38.3 06/12/2023   MCV 91.6 06/12/2023   PLT 228 06/12/2023   Lab Results  Component Value Date   LIPASE 27 06/11/2023     06/12/23: Tbili 1.8, direct bili 0.3, indirect bili 1.5. Imaging Studies   US  OB LESS THAN 14 WEEKS WITH OB TRANSVAGINAL Result Date: 06/11/2023 CLINICAL DATA:  Pregnant patient with pelvic pain and vomiting. EXAM: OBSTETRIC <14 WK ULTRASOUND TECHNIQUE: Transabdominal ultrasound was performed for evaluation of the gestation as well as the maternal uterus and adnexal regions. COMPARISON:  None Available this pregnancy. FINDINGS: Intrauterine gestational sac: Single Yolk sac:  Visualized. Embryo:  Visualized. Cardiac Activity: Visualized. Heart Rate: 150 bpm CRL:   10.1 mm   7 w 1 d                  US  EDC: 01/27/2024 Subchorionic hemorrhage: Small to moderate, lateral and slightly inferior to the gestational sac. Maternal uterus/adnexae: Periphery vascular cyst in the right ovary is typical of a corpus luteum. Rounded adjacent echogenic structure measuring 2.4 cm is felt to represent contiguous ovarian tissue. The left ovary is normal in demonstrates normal flow. Small to moderate free fluid is simple in echogenicity. IMPRESSION: 1. Single live intrauterine pregnancy estimated gestational age [redacted] weeks 1 day for ultrasound [facility] 01/27/2024 2. Small to moderate subchorionic hemorrhage. 3. Corpus luteal cyst in the right ovary. Adjacent echogenic structure is favored to represent contiguous ovarian tissue. Recommend attention on follow-up exam. Electronically Signed   By: Chadwick Colonel M.D.   On: 06/11/2023 19:43    Assessment/Plan:       Trudie Fuse. Harles Lied, MHS, PA-C [facility]

## 2023-07-12 ENCOUNTER — Encounter: Payer: Self-pay | Admitting: Internal Medicine

## 2023-07-13 ENCOUNTER — Other Ambulatory Visit

## 2023-07-18 ENCOUNTER — Encounter: Payer: Self-pay | Admitting: Internal Medicine

## 2023-07-18 ENCOUNTER — Encounter: Payer: Self-pay | Admitting: Women's Health

## 2023-07-18 DIAGNOSIS — O0990 Supervision of high risk pregnancy, unspecified, unspecified trimester: Secondary | ICD-10-CM | POA: Insufficient documentation

## 2023-07-19 ENCOUNTER — Other Ambulatory Visit (HOSPITAL_COMMUNITY)
Admission: RE | Admit: 2023-07-19 | Discharge: 2023-07-19 | Disposition: A | Discharge status: Home / Self Care | Source: Ambulatory Visit | Attending: Women's Health | Admitting: Women's Health

## 2023-07-19 ENCOUNTER — Encounter: Payer: Self-pay | Admitting: Women's Health

## 2023-07-19 ENCOUNTER — Telehealth: Payer: Self-pay | Admitting: Clinical

## 2023-07-19 ENCOUNTER — Ambulatory Visit: Admitting: Women's Health

## 2023-07-19 ENCOUNTER — Encounter: Admitting: *Deleted

## 2023-07-19 VITALS — BP 130/94 | HR 108 | Wt 186.0 lb

## 2023-07-19 DIAGNOSIS — Z1332 Encounter for screening for maternal depression: Secondary | ICD-10-CM

## 2023-07-19 DIAGNOSIS — N83201 Unspecified ovarian cyst, right side: Secondary | ICD-10-CM

## 2023-07-19 DIAGNOSIS — I10 Essential (primary) hypertension: Secondary | ICD-10-CM

## 2023-07-19 DIAGNOSIS — Z131 Encounter for screening for diabetes mellitus: Secondary | ICD-10-CM

## 2023-07-19 DIAGNOSIS — O10919 Unspecified pre-existing hypertension complicating pregnancy, unspecified trimester: Secondary | ICD-10-CM

## 2023-07-19 DIAGNOSIS — Z3A12 12 weeks gestation of pregnancy: Secondary | ICD-10-CM

## 2023-07-19 DIAGNOSIS — F418 Other specified anxiety disorders: Secondary | ICD-10-CM

## 2023-07-19 DIAGNOSIS — O0991 Supervision of high risk pregnancy, unspecified, first trimester: Secondary | ICD-10-CM | POA: Insufficient documentation

## 2023-07-19 DIAGNOSIS — Z87898 Personal history of other specified conditions: Secondary | ICD-10-CM | POA: Diagnosis not present

## 2023-07-19 DIAGNOSIS — O10911 Unspecified pre-existing hypertension complicating pregnancy, first trimester: Secondary | ICD-10-CM

## 2023-07-19 DIAGNOSIS — Z98891 History of uterine scar from previous surgery: Secondary | ICD-10-CM

## 2023-07-19 MED ORDER — NIFEDIPINE ER OSMOTIC RELEASE 30 MG PO TB24: 30.0000 mg | ORAL_TABLET | Freq: Every day | ORAL | 6 refills | Status: AC

## 2023-07-19 MED ORDER — ASPIRIN 81 MG PO TBEC: 162.0000 mg | DELAYED_RELEASE_TABLET | Freq: Every day | ORAL | 2 refills | Status: AC

## 2023-07-19 NOTE — Telephone Encounter (Signed)
Attempt call regarding referral; Unable to leave message as mailbox has not been set up.

## 2023-07-19 NOTE — Patient Instructions (Signed)
 Priscilla Castillo, thank you for choosing our office today! We appreciate the opportunity to meet your healthcare needs. You may receive a short survey by mail, e-mail, or through [employer]. If you are happy with your care we would appreciate if you could take just a few minutes to complete the survey questions. We read all of your comments and take your feedback very seriously. Thank you again for choosing our office.  Center for [employer] [facility] Team at [facility]  [facility] at [facility] ([address] [place], [place] [address]) Entrance C, located off of [address] Free 24/7 valet parking   Nausea & Vomiting Have saltine crackers or pretzels by your bed and eat a few bites before you raise your head out of bed in the morning Eat small frequent meals throughout the day instead of large meals Drink plenty of fluids throughout the day to stay hydrated, just don't drink a lot of fluids with your meals.  This can make your stomach fill up faster making you feel sick Do not brush your teeth right after you eat Products with real ginger are good for nausea, like ginger ale and ginger hard candy Make sure it says made with real ginger! Sucking on sour candy like lemon heads is also good for nausea If your prenatal vitamins make you nauseated, take them at night so you will sleep through the nausea Sea Bands If you feel like you need medicine for the nausea & vomiting please let us  know If you are unable to keep any fluids or food down please let us  know   Constipation Drink plenty of fluid, preferably water, throughout the day Eat foods high in fiber such as fruits, vegetables, and grains Exercise, such as walking, is a good way to keep your bowels regular Drink warm fluids, especially warm prune juice, or decaf coffee Eat a 1/2 cup of real oatmeal (not instant), 1/2 cup applesauce, and 1/2-1 cup warm prune juice every day If needed, you may take Colace (docusate sodium) stool  softener once or twice a day to help keep the stool soft.  If you still are having problems with constipation, you may take Miralax once daily as needed to help keep your bowels regular.   Home Blood Pressure Monitoring for Patients   Your provider has recommended that you check your blood pressure (BP) at least once a week at home. If you do not have a blood pressure cuff at home, one will be provided for you. Contact your provider if you have not received your monitor within 1 week.   Helpful Tips for Accurate Home Blood Pressure Checks  Don't smoke, exercise, or drink caffeine 30 minutes before checking your BP Use the restroom before checking your BP (a full bladder can raise your pressure) Relax in a comfortable upright chair Feet on the ground Left arm resting comfortably on a flat surface at the level of your heart Legs uncrossed Back supported Sit quietly and don't talk Place the cuff on your bare arm Adjust snuggly, so that only two fingertips can fit between your skin and the top of the cuff Check 2 readings separated by at least one minute Keep a log of your BP readings For a visual, please reference this diagram: http://ccnc.care/bpdiagram  Provider Name: [facility]     Phone: [phone]  Zone 1: ALL CLEAR  Continue to monitor your symptoms:  BP reading is less than 140 (top number) or less than 90 (bottom  number)  No right upper stomach pain No headaches or seeing spots No feeling nauseated or throwing up No swelling in face and hands  Zone 2: CAUTION Call your doctor's office for any of the following:  BP reading is greater than 140 (top number) or greater than 90 (bottom number)  Stomach pain under your ribs in the middle or right side Headaches or seeing spots Feeling nauseated or throwing up Swelling in face and hands  Zone 3: EMERGENCY  Seek immediate medical care if you have any of the following:  BP reading is greater than160 (top number) or  greater than 110 (bottom number) Severe headaches not improving with Tylenol  Serious difficulty catching your breath Any worsening symptoms from Zone 2    First Trimester of Pregnancy The first trimester of pregnancy is from week 1 until the end of week 12 (months 1 through 3). A week after a sperm fertilizes an egg, the egg will implant on the wall of the uterus. This embryo will begin to develop into a baby. Genes from you and your partner are forming the baby. The female genes determine whether the baby is a boy or a girl. At 6-8 weeks, the eyes and face are formed, and the heartbeat can be seen on ultrasound. At the end of 12 weeks, all the baby's organs are formed.  Now that you are pregnant, you will want to do everything you can to have a healthy baby. Two of the most important things are to get good prenatal care and to follow your health care provider's instructions. Prenatal care is all the medical care you receive before the baby's birth. This care will help prevent, find, and treat any problems during the pregnancy and childbirth. BODY CHANGES Your body goes through many changes during pregnancy. The changes vary from woman to woman.  You may gain or lose a couple of pounds at first. You may feel sick to your stomach (nauseous) and throw up (vomit). If the vomiting is uncontrollable, call your health care provider. You may tire easily. You may develop headaches that can be relieved by medicines approved by your health care provider. You may urinate more often. Painful urination may mean you have a bladder infection. You may develop heartburn as a result of your pregnancy. You may develop constipation because certain hormones are causing the muscles that push waste through your intestines to slow down. You may develop hemorrhoids or swollen, bulging veins (varicose veins). Your breasts may begin to grow larger and become tender. Your nipples may stick out more, and the tissue that  surrounds them (areola) may become darker. Your gums may bleed and may be sensitive to brushing and flossing. Dark spots or blotches (chloasma, mask of pregnancy) may develop on your face. This will likely fade after the baby is born. Your menstrual periods will stop. You may have a loss of appetite. You may develop cravings for certain kinds of food. You may have changes in your emotions from day to day, such as being excited to be pregnant or being concerned that something may go wrong with the pregnancy and baby. You may have more vivid and strange dreams. You may have changes in your hair. These can include thickening of your hair, rapid growth, and changes in texture. Some women also have hair loss during or after pregnancy, or hair that feels dry or thin. Your hair will most likely return to normal after your baby is born. WHAT TO EXPECT AT YOUR PRENATAL  VISITS During a routine prenatal visit: You will be weighed to make sure you and the baby are growing normally. Your blood pressure will be taken. Your abdomen will be measured to track your baby's growth. The fetal heartbeat will be listened to starting around week 10 or 12 of your pregnancy. Test results from any previous visits will be discussed. Your health care provider may ask you: How you are feeling. If you are feeling the baby move. If you have had any abnormal symptoms, such as leaking fluid, bleeding, severe headaches, or abdominal cramping. If you have any questions. Other tests that may be performed during your first trimester include: Blood tests to find your blood type and to check for the presence of any previous infections. They will also be used to check for low iron levels (anemia) and Rh antibodies. Later in the pregnancy, blood tests for diabetes will be done along with other tests if problems develop. Urine tests to check for infections, diabetes, or protein in the urine. An ultrasound to confirm the proper growth  and development of the baby. An amniocentesis to check for possible genetic problems. Fetal screens for spina bifida and Down syndrome. You may need other tests to make sure you and the baby are doing well. HOME CARE INSTRUCTIONS  Medicines Follow your health care provider's instructions regarding medicine use. Specific medicines may be either safe or unsafe to take during pregnancy. Take your prenatal vitamins as directed. If you develop constipation, try taking a stool softener if your health care provider approves. Diet Eat regular, well-balanced meals. Choose a variety of foods, such as meat or vegetable-based protein, fish, milk and low-fat dairy products, vegetables, fruits, and whole grain breads and cereals. Your health care provider will help you determine the amount of weight gain that is right for you. Avoid raw meat and uncooked cheese. These carry germs that can cause birth defects in the baby. Eating four or five small meals rather than three large meals a day may help relieve nausea and vomiting. If you start to feel nauseous, eating a few soda crackers can be helpful. Drinking liquids between meals instead of during meals also seems to help nausea and vomiting. If you develop constipation, eat more high-fiber foods, such as fresh vegetables or fruit and whole grains. Drink enough fluids to keep your urine clear or pale yellow. Activity and Exercise Exercise only as directed by your health care provider. Exercising will help you: Control your weight. Stay in shape. Be prepared for labor and delivery. Experiencing pain or cramping in the lower abdomen or low back is a good sign that you should stop exercising. Check with your health care provider before continuing normal exercises. Try to avoid standing for long periods of time. Move your legs often if you must stand in one place for a long time. Avoid heavy lifting. Wear low-heeled shoes, and practice good posture. You may  continue to have sex unless your health care provider directs you otherwise. Relief of Pain or Discomfort Wear a good support bra for breast tenderness.   Take warm sitz baths to soothe any pain or discomfort caused by hemorrhoids. Use hemorrhoid cream if your health care provider approves.   Rest with your legs elevated if you have leg cramps or low back pain. If you develop varicose veins in your legs, wear support hose. Elevate your feet for 15 minutes, 3-4 times a day. Limit salt in your diet. Prenatal Care Schedule your prenatal visits by the  twelfth week of pregnancy. They are usually scheduled monthly at first, then more often in the last 2 months before delivery. Write down your questions. Take them to your prenatal visits. Keep all your prenatal visits as directed by your health care provider. Safety Wear your seat belt at all times when driving. Make a list of emergency phone numbers, including numbers for family, friends, the hospital, and police and fire departments. General Tips Ask your health care provider for a referral to a local prenatal education class. Begin classes no later than at the beginning of month 6 of your pregnancy. Ask for help if you have counseling or nutritional needs during pregnancy. Your health care provider can offer advice or refer you to specialists for help with various needs. Do not use hot tubs, steam rooms, or saunas. Do not douche or use tampons or scented sanitary pads. Do not cross your legs for long periods of time. Avoid cat litter boxes and soil used by cats. These carry germs that can cause birth defects in the baby and possibly loss of the fetus by miscarriage or stillbirth. Avoid all smoking, herbs, alcohol, and medicines not prescribed by your health care provider. Chemicals in these affect the formation and growth of the baby. Schedule a dentist appointment. At home, brush your teeth with a soft toothbrush and be gentle when you floss. SEEK  MEDICAL CARE IF:  You have dizziness. You have mild pelvic cramps, pelvic pressure, or nagging pain in the abdominal area. You have persistent nausea, vomiting, or diarrhea. You have a bad smelling vaginal discharge. You have pain with urination. You notice increased swelling in your face, hands, legs, or ankles. SEEK IMMEDIATE MEDICAL CARE IF:  You have a fever. You are leaking fluid from your vagina. You have spotting or bleeding from your vagina. You have severe abdominal cramping or pain. You have rapid weight gain or loss. You vomit blood or material that looks like coffee grounds. You are exposed to [place] measles and have never had them. You are exposed to fifth disease or chickenpox. You develop a severe headache. You have shortness of breath. You have any kind of trauma, such as from a fall or a car accident. Document Released: 01/05/2001 Document Revised: 05/28/2013 Document Reviewed: 11/21/2012 [facility] Patient Information 2015 [place], [place]. This information is not intended to replace advice given to you by your health care provider. Make sure you discuss any questions you have with your health care provider.

## 2023-07-19 NOTE — Addendum Note (Signed)
 Addended by: ILEAN RUTHERFORD HERO on: 07/19/2023 04:06 PM   Modules accepted: Orders

## 2023-07-19 NOTE — Progress Notes (Signed)
 INITIAL OBSTETRICAL VISIT Patient name: Priscilla Castillo MRN [id number]  Date of birth: 1990-12-03 Chief Complaint:   Initial Prenatal Visit  History of Present Illness:   Priscilla Castillo is a 33 y.o. H1E7856 African-American female at [redacted]w[redacted]d by US  at 10 weeks with an Estimated Date of Delivery: 01/25/24 being seen today for her initial obstetrical visit.   No LMP recorded (lmp unknown). Patient is pregnant. Her obstetrical history is significant for hx AB x 4, 36wk C/S d/t NRFHR/pre-e-baby, 37 & 39w RCS, FGR x 3.   CHTN on hydrochlorothiazide , took today Admitted 5/17-5/20 2025 w/ HG w/ hypokalemia and hyonatremia, n/v much better since H/o opioids for knee pain w/ Dr. Brenna, referred to Dr. Waylan for suboxone, hasn't taken in months. Not on any pain meds right now Dep/anx/ADHD- no meds currently, feels she is doing ok, is interested in [facility] referral  Last pap 07/08/21. Results were: NILM w/ HRHPV not done     07/19/2023    2:49 PM  Depression screen PHQ 2/9  Decreased Interest 3  Down, Depressed, Hopeless 1  PHQ - 2 Score 4  Altered sleeping 1  Tired, decreased energy 3  Change in appetite 3  Feeling bad or failure about yourself  1  Trouble concentrating 3  Moving slowly or fidgety/restless 0  Suicidal thoughts 0  PHQ-9 Score 15        07/19/2023    2:49 PM  GAD 7 : Generalized Anxiety Score  Nervous, Anxious, on Edge 1  Control/stop worrying 1  Worry too much - different things 1  Trouble relaxing 1  Restless 1  Easily annoyed or irritable 3  Afraid - awful might happen 1  Total GAD 7 Score 9     Review of Systems:   Pertinent items are noted in HPI Denies cramping/contractions, leakage of fluid, vaginal bleeding, abnormal vaginal discharge w/ itching/odor/irritation, headaches, visual changes, shortness of breath, chest pain, abdominal pain, severe nausea/vomiting, or problems with urination or bowel movements unless otherwise stated above.  Pertinent  History Reviewed:  Reviewed past medical,surgical, social, obstetrical and family history.  Reviewed problem list, medications and allergies. OB History  Gravida Para Term Preterm AB Living  8 3 2 1 4 3   SAB IAB Ectopic Multiple Live Births  2   0 3    # Outcome Date GA Lbr Len/2nd Weight Sex Type Anes PTL Lv  8 Current           7 Term 06/28/14 [redacted]w[redacted]d  6 lb 13 oz (3.09 kg) F CS-LTranv Spinal  LIV  6 Term 06/01/10 [redacted]w[redacted]d  5 lb 5 oz (2.41 kg) F CS-LTranv   LIV     Complications: Fetal growth restriction antepartum  5 Preterm 03/10/09 [redacted]w[redacted]d  2 lb 2 oz (0.964 kg) F CS-LTranv Spinal  LIV     Complications: Fetal growth restriction antepartum, Pre-eclampsia  4 AB           3 AB           2 SAB           1 SAB            Physical Assessment:   Vitals:   07/19/23 1446  BP: (!) 130/94  Pulse: (!) 108  Weight: 186 lb (84.4 kg)  Body mass index is 35.14 kg/m.       Physical Examination:  General appearance - well appearing, and in no distress  Mental status - alert, oriented  to person, place, and time  Psych:  She has a normal mood and affect  Skin - warm and dry, normal color, no suspicious lesions noted  Chest - effort normal, all lung fields clear to auscultation bilaterally  Heart - normal rate and regular rhythm  Abdomen - soft, nontender  Extremities:  No swelling or varicosities noted  Thin prep pap is not done   Chaperone: N/A  TODAY'S FHR +via informal TA u/s, +active fetus  No results found for this or any previous visit (from the past 24 hours).  Assessment & Plan:  1) High-Risk Pregnancy H1E7856 at [redacted]w[redacted]d with an Estimated Date of Delivery: 01/25/24   2) Initial OB visit  3) CHTN> stop hydrochlorothiazide , start nifedipine 30mg  daily, f/u 1wk for bp check w/ nurse, take nifedipine 1-2hr before appt. ASA 162mg  (allergy to NSAID- thinks was Aleve , had hives, is ok trying ASA). Baseline labs today  4) Prev c/s x 3> for RCS  5) H/O FGR x 3> serial u/s  6)  Dep/anx/ADHD> no meds currently, doing well, ok w/ [facility] referral, entered  7) H/O opiods and suboxone> d/t knee pain, suboxone from Dr. Waylan, none in months, doing well  Meds:  Meds ordered this encounter  Medications   NIFEdipine (PROCARDIA-XL/NIFEDICAL-XL) 30 MG 24 hr tablet    Sig: Take 1 tablet (30 mg total) by mouth daily.    Dispense:  30 tablet    Refill:  6   aspirin EC 81 MG tablet    Sig: Take 2 tablets (162 mg total) by mouth daily. Swallow whole.    Dispense:  180 tablet    Refill:  2    Initial labs obtained Continue prenatal vitamins Reviewed n/v relief measures and warning s/s to report Reviewed recommended weight gain based on pre-gravid BMI Encouraged well-balanced diet Genetic & carrier screening discussed: requests Panorama, AFP, and Horizon , unable to get in for NT/IT Ultrasound discussed; fetal survey: requested CCNC completed> form faxed if has or is planning to apply for medicaid The nature of [facility] [employer] with multiple MDs and other Advanced Practice Providers was explained to patient; also emphasized that fellows, residents, and students are part of our team. Does have home bp cuff. Office bp cuff given: no. Rx sent: n/a. Check bp weekly, let us  know if consistently >140/90.   Follow-up: Return in about 1 week (around 07/26/2023) for nurse bp check; then 4wks HROB 2nd IT MD; 8wks from now anatomy u/s HROB MD/CNM.   Orders Placed This Encounter  Procedures   Urine Culture   CBC/D/Plt+RPR+Rh+ABO+RubIgG...   PANORAMA PRENATAL TEST   Hemoglobin A1c   Protein / creatinine ratio, urine   Comprehensive metabolic panel with GFR   HORIZON CUSTOM   Amb ref to [facility]    Suzen JONELLE Fetters CNM, [facility] 07/19/2023 3:58 PM

## 2023-07-20 LAB — COMPREHENSIVE METABOLIC PANEL WITH GFR
ALT: 6 IU/L (ref 0–32)
AST: 8 IU/L (ref 0–40)
Albumin: 4.2 g/dL (ref 3.9–4.9)
Alkaline Phosphatase: 47 IU/L (ref 44–121)
BUN/Creatinine Ratio: 8 — ABNORMAL LOW (ref 9–23)
BUN: 6 mg/dL (ref 6–20)
Bilirubin Total: 0.9 mg/dL (ref 0.0–1.2)
CO2: 20 mmol/L (ref 20–29)
Calcium: 9.9 mg/dL (ref 8.7–10.2)
Chloride: 98 mmol/L (ref 96–106)
Creatinine, Ser: 0.73 mg/dL (ref 0.57–1.00)
Globulin, Total: 2.6 g/dL (ref 1.5–4.5)
Glucose: 88 mg/dL (ref 70–99)
Potassium: 4.2 mmol/L (ref 3.5–5.2)
Sodium: 135 mmol/L (ref 134–144)
Total Protein: 6.8 g/dL (ref 6.0–8.5)
eGFR: 111 mL/min/{1.73_m2} (ref >59)

## 2023-07-20 LAB — CBC/D/PLT+RPR+RH+ABO+RUBIGG...
Antibody Screen: NEGATIVE
Basophils Absolute: 0 10*3/uL (ref 0.0–0.2)
Basos: 0 %
EOS (ABSOLUTE): 0.1 10*3/uL (ref 0.0–0.4)
Eos: 1 %
HCV Ab: NONREACTIVE
HIV Screen 4th Generation wRfx: NONREACTIVE
Hematocrit: 42.4 % (ref 34.0–46.6)
Hemoglobin: 14.2 g/dL (ref 11.1–15.9)
Hepatitis B Surface Ag: NEGATIVE
Immature Grans (Abs): 0 10*3/uL (ref 0.0–0.1)
Immature Granulocytes: 0 %
Lymphocytes Absolute: 2.2 10*3/uL (ref 0.7–3.1)
Lymphs: 25 %
MCH: 31.3 pg (ref 26.6–33.0)
MCHC: 33.5 g/dL (ref 31.5–35.7)
MCV: 94 fL (ref 79–97)
Monocytes Absolute: 0.5 10*3/uL (ref 0.1–0.9)
Monocytes: 6 %
Neutrophils Absolute: 5.7 10*3/uL (ref 1.4–7.0)
Neutrophils: 67 %
Platelets: 266 10*3/uL (ref 150–450)
RBC: 4.53 x10E6/uL (ref 3.77–5.28)
RDW: 12 % (ref 11.7–15.4)
RPR Ser Ql: NONREACTIVE
Rh Factor: POSITIVE
Rubella Antibodies, IGG: 1.35 {index} (ref >0.99)
WBC: 8.5 10*3/uL (ref 3.4–10.8)

## 2023-07-20 LAB — HEMOGLOBIN A1C
Est. average glucose Bld gHb Est-mCnc: 114 mg/dL
Hgb A1c MFr Bld: 5.6 % (ref 4.8–5.6)

## 2023-07-20 LAB — PROTEIN / CREATININE RATIO, URINE
Creatinine, Urine: 146.1 mg/dL
Protein, Ur: 9.6 mg/dL
Protein/Creat Ratio: 66 mg/g{creat} (ref 0–200)

## 2023-07-20 LAB — HCV INTERPRETATION

## 2023-07-21 LAB — URINE CULTURE

## 2023-07-21 LAB — CERVICOVAGINAL ANCILLARY ONLY
Chlamydia: NEGATIVE
Comment: NEGATIVE
Comment: NORMAL
Neisseria Gonorrhea: NEGATIVE

## 2023-07-24 LAB — PANORAMA PRENATAL TEST FULL PANEL:PANORAMA TEST PLUS 5 ADDITIONAL MICRODELETIONS: FETAL FRACTION: 3.4

## 2023-07-25 ENCOUNTER — Ambulatory Visit: Payer: Self-pay | Admitting: Women's Health

## 2023-07-26 ENCOUNTER — Encounter: Payer: Self-pay | Admitting: *Deleted

## 2023-07-26 ENCOUNTER — Ambulatory Visit: Admitting: *Deleted

## 2023-07-26 VITALS — BP 121/79 | HR 103 | Ht 61.0 in | Wt 195.0 lb

## 2023-07-26 DIAGNOSIS — Z3A13 13 weeks gestation of pregnancy: Secondary | ICD-10-CM

## 2023-07-26 DIAGNOSIS — O10911 Unspecified pre-existing hypertension complicating pregnancy, first trimester: Secondary | ICD-10-CM

## 2023-07-26 DIAGNOSIS — O10919 Unspecified pre-existing hypertension complicating pregnancy, unspecified trimester: Secondary | ICD-10-CM

## 2023-07-26 DIAGNOSIS — O0991 Supervision of high risk pregnancy, unspecified, first trimester: Secondary | ICD-10-CM

## 2023-07-26 LAB — HORIZON CUSTOM: REPORT SUMMARY: NEGATIVE

## 2023-07-26 NOTE — Progress Notes (Signed)
   NURSE VISIT- BLOOD PRESSURE CHECK  SUBJECTIVE:  Priscilla Castillo is a 33 y.o. [phone] female here for BP check. She is [redacted]w[redacted]d pregnant    HYPERTENSION ROS:  Pregnant/postpartum:  Severe headaches that don't go away with tylenol /other medicines: No  Visual changes (seeing spots/double/blurred vision) No  Severe pain under right breast breast or in center of upper chest No  Severe nausea/vomiting No  Taking medicines as instructed yes    OBJECTIVE:  BP 121/79   Pulse (!) 103   Ht 5' 1 (1.549 m)   Wt 195 lb (88.5 kg)   LMP  (LMP Unknown)   BMI 36.84 kg/m   Appearance alert, well appearing, and in no distress.  ASSESSMENT: Pregnancy [redacted]w[redacted]d  blood pressure check  PLAN: Discussed with Dr. Ozan   Recommendations: no changes needed   Follow-up: as scheduled   Clarita Salt  07/26/2023 3:38 PM

## 2023-08-08 NOTE — Telephone Encounter (Signed)
 SABRA

## 2023-08-16 ENCOUNTER — Encounter: Payer: Self-pay | Admitting: Obstetrics & Gynecology

## 2023-08-16 ENCOUNTER — Ambulatory Visit (INDEPENDENT_AMBULATORY_CARE_PROVIDER_SITE_OTHER): Admitting: Obstetrics & Gynecology

## 2023-08-16 VITALS — BP 124/83 | HR 98 | Wt 191.0 lb

## 2023-08-16 DIAGNOSIS — O10912 Unspecified pre-existing hypertension complicating pregnancy, second trimester: Secondary | ICD-10-CM

## 2023-08-16 DIAGNOSIS — Z1379 Encounter for other screening for genetic and chromosomal anomalies: Secondary | ICD-10-CM

## 2023-08-16 DIAGNOSIS — Z3A16 16 weeks gestation of pregnancy: Secondary | ICD-10-CM | POA: Diagnosis not present

## 2023-08-16 DIAGNOSIS — O0992 Supervision of high risk pregnancy, unspecified, second trimester: Secondary | ICD-10-CM | POA: Diagnosis not present

## 2023-08-16 DIAGNOSIS — O10919 Unspecified pre-existing hypertension complicating pregnancy, unspecified trimester: Secondary | ICD-10-CM

## 2023-08-16 NOTE — Progress Notes (Signed)
 HIGH-RISK PREGNANCY VISIT Patient name: Priscilla Castillo MRN [id number]  Date of birth: 1990-12-31 Chief Complaint:   High Risk Gestation (2nd IT today!!!)  History of Present Illness:   Priscilla Castillo is a 33 y.o. [phone] female at 109w6d with an Estimated Date of Delivery: 01/25/24 being seen today for ongoing management of a high-risk pregnancy complicated by     ICD-10-CM   1. Supervision of high risk pregnancy in second trimester  O09.92 AFP, Serum, Open Spina Bifida    2. Chronic hypertension affecting pregnancy  O10.919     3. Encounter for genetic screening  Z13.79 AFP, Serum, Open Spina Bifida    4. [redacted] weeks gestation of pregnancy  Z3A.16      .    Today she reports no complaints. Contractions: Not present. Vag. Bleeding: None.   . denies leaking of fluid.      07/19/2023    2:49 PM  Depression screen PHQ 2/9  Decreased Interest 3  Down, Depressed, Hopeless 1  PHQ - 2 Score 4  Altered sleeping 1  Tired, decreased energy 3  Change in appetite 3  Feeling bad or failure about yourself  1  Trouble concentrating 3  Moving slowly or fidgety/restless 0  Suicidal thoughts 0  PHQ-9 Score 15        07/19/2023    2:49 PM  GAD 7 : Generalized Anxiety Score  Nervous, Anxious, on Edge 1  Control/stop worrying 1  Worry too much - different things 1  Trouble relaxing 1  Restless 1  Easily annoyed or irritable 3  Afraid - awful might happen 1  Total GAD 7 Score 9     Review of Systems:   Pertinent items are noted in HPI Denies abnormal vaginal discharge w/ itching/odor/irritation, headaches, visual changes, shortness of breath, chest pain, abdominal pain, severe nausea/vomiting, or problems with urination or bowel movements unless otherwise stated above. Pertinent History Reviewed:  Reviewed past medical,surgical, social, obstetrical and family history.  Reviewed problem list, medications and allergies. Physical Assessment:   Vitals:   08/16/23 1540   BP: 124/83  Pulse: 98  Weight: 191 lb (86.6 kg)  Body mass index is 36.09 kg/m.           Physical Examination:   General appearance: alert, well appearing, and in no distress  Mental status: alert, oriented to person, place, and time  Skin: warm & dry   Extremities: Edema: None    Cardiovascular: normal heart rate noted  Respiratory: normal respiratory effort, no distress  Abdomen: gravid, soft, non-tender  Pelvic: Cervical exam deferred         Fetal Status: Fetal Heart Rate (bpm): 150        Fetal Surveillance Testing today: 150   Chaperone: N/A    No results found for this or any previous visit (from the past 24 hours).  Assessment & Plan:  High-risk pregnancy: H1E7856 at [redacted]w[redacted]d with an Estimated Date of Delivery: 01/25/24      ICD-10-CM   1. Supervision of high risk pregnancy in second trimester  O09.92 AFP, Serum, Open Spina Bifida    2. Chronic hypertension affecting pregnancy  O10.919     3. Encounter for genetic screening  Z13.79 AFP, Serum, Open Spina Bifida    4. [redacted] weeks gestation of pregnancy  Z3A.16          Meds: No orders of the defined types were placed in this encounter.   Orders:  Orders Placed  This Encounter  Procedures   AFP, Serum, Open Spina Bifida     Labs/procedures today: U/S  Treatment Plan:  keep scheduled    Follow-up: Return for keep scheduled.   Future Appointments  Date Time Provider Department Center  09/13/2023  3:00 PM [facility] US  [facility] None  09/13/2023  3:50 PM Booker, Suzen SAUNDERS, CNM [facility]    Orders Placed This Encounter  Procedures   AFP, Serum, Open Spina Bifida   Vonn VEAR Inch  Attending Physician for the Center for [facility] 08/16/2023 4:01 PM

## 2023-08-18 LAB — AFP, SERUM, OPEN SPINA BIFIDA
AFP MoM: 1.37
AFP Value: 46.6 ng/mL
Gest. Age on Collection Date: 16.6 wk
Maternal Age At EDD: 33.9 a
OSBR Risk 1 IN: 7840
Test Results:: NEGATIVE
Weight: 191 [lb_av]

## 2023-09-12 ENCOUNTER — Other Ambulatory Visit: Payer: Self-pay | Admitting: Obstetrics & Gynecology

## 2023-09-12 DIAGNOSIS — Z363 Encounter for antenatal screening for malformations: Secondary | ICD-10-CM

## 2023-09-13 ENCOUNTER — Ambulatory Visit: Admitting: Women's Health

## 2023-09-13 ENCOUNTER — Other Ambulatory Visit

## 2023-09-13 ENCOUNTER — Encounter: Payer: Self-pay | Admitting: Women's Health

## 2023-09-13 VITALS — BP 125/75 | HR 109 | Wt 199.0 lb

## 2023-09-13 DIAGNOSIS — O0992 Supervision of high risk pregnancy, unspecified, second trimester: Secondary | ICD-10-CM

## 2023-09-13 DIAGNOSIS — O10919 Unspecified pre-existing hypertension complicating pregnancy, unspecified trimester: Secondary | ICD-10-CM

## 2023-09-13 DIAGNOSIS — Z363 Encounter for antenatal screening for malformations: Secondary | ICD-10-CM | POA: Diagnosis not present

## 2023-09-13 DIAGNOSIS — Z3A20 20 weeks gestation of pregnancy: Secondary | ICD-10-CM

## 2023-09-13 DIAGNOSIS — O10912 Unspecified pre-existing hypertension complicating pregnancy, second trimester: Secondary | ICD-10-CM | POA: Diagnosis not present

## 2023-09-13 NOTE — Progress Notes (Signed)
 HIGH-RISK PREGNANCY VISIT Patient name: Priscilla Castillo MRN [id number]  Date of birth: [birth year] Chief Complaint:   Routine Prenatal Visit  History of Present Illness:   Priscilla Castillo is a 33 y.o. H1E7856 female at [redacted]w[redacted]d with an Estimated Date of Delivery: 01/25/24 being seen today for ongoing management of a high-risk pregnancy complicated by chronic hypertension currently on nifedipine  30mg  daily, h/o pre-e, c/s x 3, h/o FGR.    Today she reports no complaints. Contractions: Not present. Vag. Bleeding: None.  Movement: Present. denies leaking of fluid.      07/19/2023    2:49 PM  Depression screen PHQ 2/9  Decreased Interest 3  Down, Depressed, Hopeless 1  PHQ - 2 Score 4  Altered sleeping 1  Tired, decreased energy 3  Change in appetite 3  Feeling bad or failure about yourself  1  Trouble concentrating 3  Moving slowly or fidgety/restless 0  Suicidal thoughts 0  PHQ-9 Score 15        07/19/2023    2:49 PM  GAD 7 : Generalized Anxiety Score  Nervous, Anxious, on Edge 1  Control/stop worrying 1  Worry too much - different things 1  Trouble relaxing 1  Restless 1  Easily annoyed or irritable 3  Afraid - awful might happen 1  Total GAD 7 Score 9     Review of Systems:   Pertinent items are noted in HPI Denies abnormal vaginal discharge w/ itching/odor/irritation, headaches, visual changes, shortness of breath, chest pain, abdominal pain, severe nausea/vomiting, or problems with urination or bowel movements unless otherwise stated above. Pertinent History Reviewed:  Reviewed past medical,surgical, social, obstetrical and family history.  Reviewed problem list, medications and allergies. Physical Assessment:   Vitals:   09/13/23 1610  BP: 125/75  Pulse: (!) 109  Weight: 199 lb (90.3 kg)  Body mass index is 37.6 kg/m.           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  Assessment & Plan:  High-risk pregnancy: H1E7856 at [redacted]w[redacted]d with an Estimated Date of Delivery: 01/25/24   1) CHTN, stable on nifedipine  30mg  daily, ASA  2) H/O pre-e  3) Prev c/s x 3> for RCS  4) H/O FGR> EFW 31% today  Meds: No orders of the defined types were placed in this encounter.   Labs/procedures today: U/S  Treatment Plan: EFW q 4w @ 24w    2x/wk testing nst/sono @ 32wks     Deliver 37-39.0wks (or as per MFM w/ poor control)____   Reviewed: Preterm labor symptoms and general obstetric precautions including but not limited to vaginal bleeding, contractions, leaking of fluid and fetal movement were reviewed in detail with the patient.  All questions were answered. Does have home bp cuff. Office bp cuff given: not applicable. Check bp weekly, let us  know if consistently >140 and/or >90.  Follow-up: Return in about 4 weeks (around 10/11/2023) for HROB, US :EFW, MD or CNM, in person.   Future Appointments  Date Time Provider Department Center  10/10/2023  3:50 PM Kizzie Suzen SAUNDERS, CNM [facility]    Orders Placed This Encounter  Procedures   US  OB Follow Up   Suzen SAUNDERS Kizzie CNM, [facility] 09/13/2023 4:25 PM

## 2023-09-13 NOTE — Progress Notes (Signed)
 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

## 2023-09-13 NOTE — Patient Instructions (Signed)
 Priscilla Castillo, thank you for choosing our office today! We appreciate the opportunity to meet your healthcare needs. You may receive a short survey by mail, e-mail, or through [employer]. If you are happy with your care we would appreciate if you could take just a few minutes to complete the survey questions. We read all of your comments and take your feedback very seriously. Thank you again for choosing our office.  Center for [employer] Team at [facility] at [facility] ([address] [place], [place] [address]) Entrance C, located off of [address] Free 24/7 valet parking  Go to [email or web address] to register for FREE online childbirth classes  Call the office [phone]) or go to [facility] if: You begin to severe cramping Your water breaks.  Sometimes it is a big gush of fluid, sometimes it is just a trickle that keeps getting your panties wet or running down your legs You have vaginal bleeding.  It is normal to have a small amount of spotting if your cervix was checked.   [facility] Pediatricians/Family Doctors [facility]): [address] Luba BROCKS, [phone]           [facility]: [address], [phone]                [facility]): [address], [phone] (call to ask if accepting patients) [facility]: [address], [place], [phone]    [facility] Pediatricians/Family Doctors [facility]): [phone], [phone] [facility]: [address] [place], [phone] [facility]: [address], [phone]  [facility] Doctors  [facility]): [phone] [facility]: [address] [phone]   [facility] Doctors [facility]: [address], [phone]  [facility] Doctors  [facility]: [phone], [phone]  Home Blood Pressure Monitoring for Patients   Your provider has recommended that you check your blood pressure (BP) at least once a week at home. If you do not have a blood pressure cuff at home, one will be provided for you. Contact your provider if you have not received your monitor within 1 week.   Helpful Tips for Accurate Home Blood Pressure Checks  Don't smoke, exercise, or drink caffeine 30 minutes before checking your BP Use the restroom before checking your BP (a full bladder can raise your pressure) Relax in a comfortable upright chair Feet on the ground Left arm resting comfortably on a flat surface at the level of your heart Legs uncrossed Back supported Sit quietly and don't talk Place the cuff on your bare arm Adjust snuggly, so that only two fingertips can fit between your skin and the top of the cuff Check 2 readings separated by at least one minute Keep a log of your BP readings For a visual, please reference this diagram: http://ccnc.care/bpdiagram  Provider Name: [facility]     Phone: [phone]  Zone 1: ALL CLEAR  Continue to monitor your symptoms:  BP reading is less than 140 (top number) or less than 90 (bottom number)  No right upper stomach pain No headaches or seeing spots No feeling nauseated or throwing up No swelling in face and hands  Zone 2: CAUTION Call your doctor's office for any of the following:  BP reading is greater than 140 (top number) or greater than  90 (bottom number)  Stomach pain under your ribs in the middle or right side Headaches or seeing spots Feeling nauseated or throwing up Swelling in face and hands  Zone 3: EMERGENCY  Seek immediate medical care if you have any of the following:  BP reading is greater than160 (top number) or greater than 110 (bottom number) Severe headaches not improving with Tylenol  Serious difficulty catching your breath Any worsening symptoms from  Zone 2     Second Trimester of Pregnancy The second trimester is from week 14 through week 27 (months 4 through 6). The second trimester is often a time when you feel your best. Your body has adjusted to being pregnant, and you begin to feel better physically. Usually, morning sickness has lessened or quit completely, you may have more energy, and you may have an increase in appetite. The second trimester is also a time when the fetus is growing rapidly. At the end of the sixth month, the fetus is about 9 inches long and weighs about 1 pounds. You will likely begin to feel the baby move (quickening) between 16 and 20 weeks of pregnancy. Body changes during your second trimester Your body continues to go through many changes during your second trimester. The changes vary from woman to woman. Your weight will continue to increase. You will notice your lower abdomen bulging out. You may begin to get stretch marks on your hips, abdomen, and breasts. You may develop headaches that can be relieved by medicines. The medicines should be approved by your health care provider. You may urinate more often because the fetus is pressing on your bladder. You may develop or continue to have heartburn as a result of your pregnancy. You may develop constipation because certain hormones are causing the muscles that push waste through your intestines to slow down. You may develop hemorrhoids or swollen, bulging veins (varicose veins). You may have back pain. This is caused by: Weight gain. Pregnancy hormones that are relaxing the joints in your pelvis. A shift in weight and the muscles that support your balance. Your breasts will continue to grow and they will continue to become tender. Your gums may bleed and may be sensitive to brushing and flossing. Dark spots or blotches (chloasma, mask of pregnancy) may develop on your face. This will likely fade after the baby is born. A dark line from your belly button to  the pubic area (linea nigra) may appear. This will likely fade after the baby is born. You may have changes in your hair. These can include thickening of your hair, rapid growth, and changes in texture. Some women also have hair loss during or after pregnancy, or hair that feels dry or thin. Your hair will most likely return to normal after your baby is born.  What to expect at prenatal visits During a routine prenatal visit: You will be weighed to make sure you and the fetus are growing normally. Your blood pressure will be taken. Your abdomen will be measured to track your baby's growth. The fetal heartbeat will be listened to. Any test results from the previous visit will be discussed.  Your health care provider may ask you: How you are feeling. If you are feeling the baby move. If you have had any abnormal symptoms, such as leaking fluid, bleeding, severe headaches, or abdominal cramping. If you are using any tobacco products, including cigarettes, chewing tobacco, and electronic cigarettes. If you have any questions.  Other tests that may be performed during  your second trimester include: Blood tests that check for: Low iron levels (anemia). High blood sugar that affects pregnant women (gestational diabetes) between 80 and 28 weeks. Rh antibodies. This is to check for a protein on red blood cells (Rh factor). Urine tests to check for infections, diabetes, or protein in the urine. An ultrasound to confirm the proper growth and development of the baby. An amniocentesis to check for possible genetic problems. Fetal screens for spina bifida and Down syndrome. HIV (human immunodeficiency virus) testing. Routine prenatal testing includes screening for HIV, unless you choose not to have this test.  Follow these instructions at home: Medicines Follow your health care provider's instructions regarding medicine use. Specific medicines may be either safe or unsafe to take during  pregnancy. Take a prenatal vitamin that contains at least 600 micrograms (mcg) of folic acid. If you develop constipation, try taking a stool softener if your health care provider approves. Eating and drinking Eat a balanced diet that includes fresh fruits and vegetables, whole grains, good sources of protein such as meat, eggs, or tofu, and low-fat dairy. Your health care provider will help you determine the amount of weight gain that is right for you. Avoid raw meat and uncooked cheese. These carry germs that can cause birth defects in the baby. If you have low calcium intake from food, talk to your health care provider about whether you should take a daily calcium supplement. Limit foods that are high in fat and processed sugars, such as fried and sweet foods. To prevent constipation: Drink enough fluid to keep your urine clear or pale yellow. Eat foods that are high in fiber, such as fresh fruits and vegetables, whole grains, and beans. Activity Exercise only as directed by your health care provider. Most women can continue their usual exercise routine during pregnancy. Try to exercise for 30 minutes at least 5 days a week. Stop exercising if you experience uterine contractions. Avoid heavy lifting, wear low heel shoes, and practice good posture. A sexual relationship may be continued unless your health care provider directs you otherwise. Relieving pain and discomfort Wear a good support bra to prevent discomfort from breast tenderness. Take warm sitz baths to soothe any pain or discomfort caused by hemorrhoids. Use hemorrhoid cream if your health care provider approves. Rest with your legs elevated if you have leg cramps or low back pain. If you develop varicose veins, wear support hose. Elevate your feet for 15 minutes, 3-4 times a day. Limit salt in your diet. Prenatal Care Write down your questions. Take them to your prenatal visits. Keep all your prenatal visits as told by your health  care provider. This is important. Safety Wear your seat belt at all times when driving. Make a list of emergency phone numbers, including numbers for family, friends, the hospital, and police and fire departments. General instructions Ask your health care provider for a referral to a local prenatal education class. Begin classes no later than the beginning of month 6 of your pregnancy. Ask for help if you have counseling or nutritional needs during pregnancy. Your health care provider can offer advice or refer you to specialists for help with various needs. Do not use hot tubs, steam rooms, or saunas. Do not douche or use tampons or scented sanitary pads. Do not cross your legs for long periods of time. Avoid cat litter boxes and soil used by cats. These carry germs that can cause birth defects in the baby and possibly loss of the  fetus by miscarriage or stillbirth. Avoid all smoking, herbs, alcohol, and unprescribed drugs. Chemicals in these products can affect the formation and growth of the baby. Do not use any products that contain nicotine or tobacco, such as cigarettes and e-cigarettes. If you need help quitting, ask your health care provider. Visit your dentist if you have not gone yet during your pregnancy. Use a soft toothbrush to brush your teeth and be gentle when you floss. Contact a health care provider if: You have dizziness. You have mild pelvic cramps, pelvic pressure, or nagging pain in the abdominal area. You have persistent nausea, vomiting, or diarrhea. You have a bad smelling vaginal discharge. You have pain when you urinate. Get help right away if: You have a fever. You are leaking fluid from your vagina. You have spotting or bleeding from your vagina. You have severe abdominal cramping or pain. You have rapid weight gain or weight loss. You have shortness of breath with chest pain. You notice sudden or extreme swelling of your face, hands, ankles, feet, or legs. You  have not felt your baby move in over an hour. You have severe headaches that do not go away when you take medicine. You have vision changes. Summary The second trimester is from week 14 through week 27 (months 4 through 6). It is also a time when the fetus is growing rapidly. Your body goes through many changes during pregnancy. The changes vary from woman to woman. Avoid all smoking, herbs, alcohol, and unprescribed drugs. These chemicals affect the formation and growth your baby. Do not use any tobacco products, such as cigarettes, chewing tobacco, and e-cigarettes. If you need help quitting, ask your health care provider. Contact your health care provider if you have any questions. Keep all prenatal visits as told by your health care provider. This is important. This information is not intended to replace advice given to you by your health care provider. Make sure you discuss any questions you have with your health care provider. Document Released: 01/05/2001 Document Revised: 06/19/2015 Document Reviewed: 03/14/2012 Elsevier Interactive Patient Education  2017 [employer].

## 2023-09-16 ENCOUNTER — Encounter: Payer: Self-pay | Admitting: Radiology

## 2023-09-21 ENCOUNTER — Telehealth: Payer: Self-pay | Admitting: Clinical

## 2023-09-21 NOTE — Telephone Encounter (Signed)
Attempt call regarding referral; Left HIPPA-compliant message to call back Mikle Sternberg from [facility] at  [phone] (Ashelyn Mccravy's office).    

## 2023-10-10 ENCOUNTER — Encounter: Admitting: Women's Health

## 2023-10-11 ENCOUNTER — Encounter: Payer: Self-pay | Admitting: Women's Health

## 2023-10-11 ENCOUNTER — Ambulatory Visit

## 2023-10-11 ENCOUNTER — Ambulatory Visit: Admitting: Women's Health

## 2023-10-11 VITALS — BP 119/67 | HR 101 | Wt 193.0 lb

## 2023-10-11 DIAGNOSIS — Z3A24 24 weeks gestation of pregnancy: Secondary | ICD-10-CM | POA: Diagnosis not present

## 2023-10-11 DIAGNOSIS — O10919 Unspecified pre-existing hypertension complicating pregnancy, unspecified trimester: Secondary | ICD-10-CM

## 2023-10-11 DIAGNOSIS — O0992 Supervision of high risk pregnancy, unspecified, second trimester: Secondary | ICD-10-CM | POA: Diagnosis not present

## 2023-10-11 DIAGNOSIS — O10912 Unspecified pre-existing hypertension complicating pregnancy, second trimester: Secondary | ICD-10-CM | POA: Diagnosis not present

## 2023-10-11 MED ORDER — PROMETHAZINE HCL 25 MG PO TABS: 12.5000 mg | ORAL_TABLET | Freq: Four times a day (QID) | ORAL | 6 refills | Status: AC | PRN

## 2023-10-11 NOTE — Patient Instructions (Signed)
 Priscilla Castillo, thank you for choosing our office today! We appreciate the opportunity to meet your healthcare needs. You may receive a short survey by mail, e-mail, or through [employer]. If you are happy with your care we would appreciate if you could take just a few minutes to complete the survey questions. We read all of your comments and take your feedback very seriously. Thank you again for choosing our office.  Center for [employer] Team at [facility]  [facility] at [facility] ([address] [place], [place] [address]) Entrance C, located off of [address] Free 24/7 valet parking   You will have your sugar test next visit.  Please do not eat or drink anything after midnight the night before you come, not even water.  You will be here for at least two hours.  Please make an appointment online for the bloodwork at [email or web address] for 8:00am (or as close to this as possible). Make sure you select the [facility] service center.   CLASSES: Go to [email or web address] to register for classes (childbirth, breastfeeding, waterbirth, infant CPR, daddy bootcamp, etc.)  Call the office [phone]) or go to [facility] if: You begin to have strong, frequent contractions Your water breaks.  Sometimes it is a big gush of fluid, sometimes it is just a trickle that keeps getting your panties wet or running down your legs You have vaginal bleeding.  It is normal to have a small amount of spotting if your cervix was checked.  You don't feel your baby moving like normal.  If you don't, get you something to eat and drink and lay down and focus on feeling your baby move.   If your baby is still not moving like normal, you should call the office or go to [facility]  Call the office [phone]) or go to [facility] hospital for these signs of pre-eclampsia: Severe headache that does not go away with Tylenol  Visual changes- seeing spots, double, blurred vision Pain under your right breast or  upper abdomen that does not go away with Tums or heartburn medicine Nausea and/or vomiting Severe swelling in your hands, feet, and face    [place] Pediatricians/Family Doctors [facility]): [address] Luba BROCKS, [phone]           [facility]: [address], [phone]                [facility]): [address], [phone]  [facility] Department: [address], [place], [phone]    [facility] Pediatricians/Family Doctors [facility]): [address], [phone] [facility]: [address] [place], [phone] [facility] [place]: [address], [phone]  [facility] Doctors  [facility]): [phone] [facility]: [address] [phone]   [facility] Doctors [facility]: [address] [phone]  [facility] Doctors  [facility]: [phone], [phone]  Home Blood Pressure Monitoring for Patients   Your provider has recommended that you check your blood pressure (BP) at least once a week at home. If you do not have a blood pressure cuff at home, one will be provided for you. Contact your provider if you have not received your monitor within 1 week.   Helpful Tips for Accurate Home Blood Pressure Checks  Don't smoke, exercise, or drink caffeine 30 minutes before checking  your BP Use the restroom before checking your BP (a full bladder can raise your pressure) Relax in a comfortable upright chair Feet on the ground Left arm resting comfortably on a flat surface at the level of your heart Legs uncrossed Back supported Sit quietly and don't talk Place the cuff on your bare arm Adjust snuggly, so that only two fingertips can fit between your skin and the top of the cuff Check 2 readings separated by at least one  minute Keep a log of your BP readings For a visual, please reference this diagram: http://ccnc.care/bpdiagram  Provider Name: [facility]     Phone: [phone]  Zone 1: ALL CLEAR  Continue to monitor your symptoms:  BP reading is less than 140 (top number) or less than 90 (bottom number)  No right upper stomach pain No headaches or seeing spots No feeling nauseated or throwing up No swelling in face and hands  Zone 2: CAUTION Call your doctor's office for any of the following:  BP reading is greater than 140 (top number) or greater than 90 (bottom number)  Stomach pain under your ribs in the middle or right side Headaches or seeing spots Feeling nauseated or throwing up Swelling in face and hands  Zone 3: EMERGENCY  Seek immediate medical care if you have any of the following:  BP reading is greater than160 (top number) or greater than 110 (bottom number) Severe headaches not improving with Tylenol  Serious difficulty catching your breath Any worsening symptoms from Zone 2   Second Trimester of Pregnancy The second trimester is from week 13 through week 28, months 4 through 6. The second trimester is often a time when you feel your best. Your body has also adjusted to being pregnant, and you begin to feel better physically. Usually, morning sickness has lessened or quit completely, you may have more energy, and you may have an increase in appetite. The second trimester is also a time when the fetus is growing rapidly. At the end of the sixth month, the fetus is about 9 inches long and weighs about 1 pounds. You will likely begin to feel the baby move (quickening) between 18 and 20 weeks of the pregnancy. BODY CHANGES Your body goes through many changes during pregnancy. The changes vary from woman to woman.  Your weight will continue to increase. You will notice your lower abdomen bulging out. You may begin to get stretch marks on your hips, abdomen, and breasts. You may  develop headaches that can be relieved by medicines approved by your health care provider. You may urinate more often because the fetus is pressing on your bladder. You may develop or continue to have heartburn as a result of your pregnancy. You may develop constipation because certain hormones are causing the muscles that push waste through your intestines to slow down. You may develop hemorrhoids or swollen, bulging veins (varicose veins). You may have back pain because of the weight gain and pregnancy hormones relaxing your joints between the bones in your pelvis and as a result of a shift in weight and the muscles that support your balance. Your breasts will continue to grow and be tender. Your gums may bleed and may be sensitive to brushing and flossing. Dark spots or blotches (chloasma, mask of pregnancy) may develop on your face. This will likely fade after the baby is born. A dark line from your belly button to the pubic area (linea nigra) may appear. This will likely fade after the  baby is born. You may have changes in your hair. These can include thickening of your hair, rapid growth, and changes in texture. Some women also have hair loss during or after pregnancy, or hair that feels dry or thin. Your hair will most likely return to normal after your baby is born. WHAT TO EXPECT AT YOUR PRENATAL VISITS During a routine prenatal visit: You will be weighed to make sure you and the fetus are growing normally. Your blood pressure will be taken. Your abdomen will be measured to track your baby's growth. The fetal heartbeat will be listened to. Any test results from the previous visit will be discussed. Your health care provider may ask you: How you are feeling. If you are feeling the baby move. If you have had any abnormal symptoms, such as leaking fluid, bleeding, severe headaches, or abdominal cramping. If you have any questions. Other tests that may be performed during your second  trimester include: Blood tests that check for: Low iron levels (anemia). Gestational diabetes (between 24 and 28 weeks). Rh antibodies. Urine tests to check for infections, diabetes, or protein in the urine. An ultrasound to confirm the proper growth and development of the baby. An amniocentesis to check for possible genetic problems. Fetal screens for spina bifida and Down syndrome. HOME CARE INSTRUCTIONS  Avoid all smoking, herbs, alcohol, and unprescribed drugs. These chemicals affect the formation and growth of the baby. Follow your health care provider's instructions regarding medicine use. There are medicines that are either safe or unsafe to take during pregnancy. Exercise only as directed by your health care provider. Experiencing uterine cramps is a good sign to stop exercising. Continue to eat regular, healthy meals. Wear a good support bra for breast tenderness. Do not use hot tubs, steam rooms, or saunas. Wear your seat belt at all times when driving. Avoid raw meat, uncooked cheese, cat litter boxes, and soil used by cats. These carry germs that can cause birth defects in the baby. Take your prenatal vitamins. Try taking a stool softener (if your health care provider approves) if you develop constipation. Eat more high-fiber foods, such as fresh vegetables or fruit and whole grains. Drink plenty of fluids to keep your urine clear or pale yellow. Take warm sitz baths to soothe any pain or discomfort caused by hemorrhoids. Use hemorrhoid cream if your health care provider approves. If you develop varicose veins, wear support hose. Elevate your feet for 15 minutes, 3-4 times a day. Limit salt in your diet. Avoid heavy lifting, wear low heel shoes, and practice good posture. Rest with your legs elevated if you have leg cramps or low back pain. Visit your dentist if you have not gone yet during your pregnancy. Use a soft toothbrush to brush your teeth and be gentle when you floss. A  sexual relationship may be continued unless your health care provider directs you otherwise. Continue to go to all your prenatal visits as directed by your health care provider. SEEK MEDICAL CARE IF:  You have dizziness. You have mild pelvic cramps, pelvic pressure, or nagging pain in the abdominal area. You have persistent nausea, vomiting, or diarrhea. You have a bad smelling vaginal discharge. You have pain with urination. SEEK IMMEDIATE MEDICAL CARE IF:  You have a fever. You are leaking fluid from your vagina. You have spotting or bleeding from your vagina. You have severe abdominal cramping or pain. You have rapid weight gain or loss. You have shortness of breath with chest pain. You  notice sudden or extreme swelling of your face, hands, ankles, feet, or legs. You have not felt your baby move in over an hour. You have severe headaches that do not go away with medicine. You have vision changes. Document Released: 01/05/2001 Document Revised: 01/16/2013 Document Reviewed: 03/14/2012 [facility] Patient Information 2015 [place], [place]. This information is not intended to replace advice given to you by your health care provider. Make sure you discuss any questions you have with your health care provider.

## 2023-10-11 NOTE — Progress Notes (Signed)
 HIGH-RISK PREGNANCY VISIT Patient name: Priscilla Castillo MRN [id number]  Date of birth: Jul 02, 1990 Chief Complaint:   Routine Prenatal Visit  History of Present Illness:   Priscilla Castillo is a 33 y.o. H1E7856 female at [redacted]w[redacted]d with an Estimated Date of Delivery: 01/25/24 being seen today for ongoing management of a high-risk pregnancy complicated by chronic hypertension currently on nifedipine  30mg  daily.    Today she reports requests phenergan  rx for nausea. Patch on tongue for few weeks like skin peeled off. Contractions: Irritability. Vag. Bleeding: None.  Movement: Present. denies leaking of fluid.      07/19/2023    2:49 PM  Depression screen PHQ 2/9  Decreased Interest 3  Down, Depressed, Hopeless 1  PHQ - 2 Score 4  Altered sleeping 1  Tired, decreased energy 3  Change in appetite 3  Feeling bad or failure about yourself  1  Trouble concentrating 3  Moving slowly or fidgety/restless 0  Suicidal thoughts 0  PHQ-9 Score 15        07/19/2023    2:49 PM  GAD 7 : Generalized Anxiety Score  Nervous, Anxious, on Edge 1  Control/stop worrying 1  Worry too much - different things 1  Trouble relaxing 1  Restless 1  Easily annoyed or irritable 3  Afraid - awful might happen 1  Total GAD 7 Score 9     Review of Systems:   Pertinent items are noted in HPI Denies abnormal vaginal discharge w/ itching/odor/irritation, headaches, visual changes, shortness of breath, chest pain, abdominal pain, severe nausea/vomiting, or problems with urination or bowel movements unless otherwise stated above. Pertinent History Reviewed:  Reviewed past medical,surgical, social, obstetrical and family history.  Reviewed problem list, medications and allergies. Physical Assessment:   Vitals:   10/11/23 1057  BP: 119/67  Pulse: (!) 101  Weight: 193 lb (87.5 kg)  Body mass index is 36.47 kg/m.           Physical Examination:   General appearance: alert, well appearing, and in no  distress  Mental status: alert, oriented to person, place, and time  Skin: warm & dry, tongue appears a bit geographic  Extremities:      Cardiovascular: normal heart rate noted  Respiratory: normal respiratory effort, no distress  Abdomen: gravid, soft, non-tender  Pelvic: Cervical exam deferred         Fetal Status: Fetal Heart Rate (bpm): 151 Fundal Height: 26 cm Movement: Present    Fetal Surveillance Testing today: doppler   Chaperone: N/A  No results found for this or any previous visit (from the past 24 hours).  Assessment & Plan:  High-risk pregnancy: [id number] at [redacted]w[redacted]d with an Estimated Date of Delivery: 01/25/24   1) CHTN w/ h/o pre-e, stable on nifedipine  30mg , ASA  2) Prev c/s x 3, for RCS  3) H/O FGR  4) Nausea> rx phenergan   Meds:  Meds ordered this encounter  Medications   promethazine  (PHENERGAN ) 25 MG tablet    Sig: Take 0.5-1 tablets (12.5-25 mg total) by mouth every 6 (six) hours as needed.    Dispense:  30 tablet    Refill:  6    Labs/procedures today: none, was too late for u/s  Treatment Plan:  EFW q 4w @ 24w    2x/wk testing nst/sono @ 32wks     Deliver 37-39.0wks (or as per MFM w/ poor control)____   Reviewed: Preterm labor symptoms and general obstetric precautions including but not limited to vaginal bleeding,  contractions, leaking of fluid and fetal movement were reviewed in detail with the patient.  All questions were answered. Does have home bp cuff. Office bp cuff given: not applicable. Check bp weekly, let us  know if consistently >140 and/or >90.  Follow-up: Return for asap efw u/s (no visit); then 4wks from now efw u/s, pn2, hrob w/ md/cnm.   No future appointments.   No orders of the defined types were placed in this encounter.  Priscilla Castillo CNM, [facility] 10/11/2023 11:21 AM

## 2023-10-31 NOTE — ED Provider Notes (Addendum)
 Emergency Department Provider Note    ED Clinical Impression   Final diagnoses:  Intractable vomiting (Primary)  Cannabis hyperemesis syndrome  Abdominal pain, unspecified abdominal location    ED Assessment/Plan    Condition: Stable Disposition: Admit  This chart has been completed using Engineer, civil (consulting) software, and while attempts have been made to ensure accuracy, certain words and phrases may not be transcribed as intended.   History   Chief Complaint  Patient presents with  . Abdominal Pain  . Emesis  . Nausea   HPI  Priscilla Castillo is a 33 y.o. female  who presents today to the  emergency department complaining of nausea, vomiting, generalized abdominal pain since yesterday.  Patient is G9, P3, both [redacted] weeks pregnant.  Patient has a history of daily marijuana use.  She denies any fever or chills.  She denies any diarrhea or constipation.  She describes symptoms as moderate.  There are no obvious aggravating or relieving factors.  T Patient was seen in L&D and cleared from [facility] perspective was sent to the ED.    Allergies: is allergic to banana, nsaids (non-steroidal anti-inflammatory drug), shellfish containing products, and latex. Medications: has a current medication list which includes the following long-term medication(s): aspirin  and nifedipine . PMHx:  has a past medical history of ADHD, Chronic hypertension, Depression, Dermoid cyst, IUGR (intrauterine growth restriction) affecting care of mother (HHS-HCC), Left ear pain, Pre-eclampsia (HHS-HCC), and Seasonal allergic rhinitis due to pollen. PSHx:  has a past surgical history that includes Cesarean section; Repeat cesarean section; Repeat cesarean section; and Cholecystectomy. SocHx:  reports that she has been smoking cigarettes. She has been exposed to tobacco smoke. She does not have any smokeless tobacco history on file.  She reports that she does not currently use drugs. She reports that she does not drink alcohol. Allergies, Medications, Medical, Surgical, and Social History were reviewed as documented above.   Social Drivers of Health with Concerns   Tobacco Use: High Risk (10/31/2023)   Patient History   . Smoking Tobacco Use: Every Day   . Smokeless Tobacco Use: Unknown   . Passive Exposure: Current  Physical Activity: Inactive (07/19/2023)   Received from [facility]   Exercise Vital Sign   . On average, how many days per week do you engage in moderate to strenuous exercise (like a brisk walk)?: 0 days   . On average, how many minutes do you engage in exercise at this level?: 0 min  Stress: Stress Concern Present (07/19/2023)   Received from [facility] - Occupational Stress Questionnaire   . Do you feel stress - tense, restless, nervous, or anxious, or unable to sleep at night because your mind is troubled all the time - these days?: To some extent  Social Connections: Moderately Isolated (07/19/2023)   Received from [facility]   Social Connection and Isolation Panel   . In a typical week, how many times do you talk on the phone with family, friends, or neighbors?: More than three times a week   . How often do you get together with friends or relatives?: Three times a week   . How often do you attend church or religious services?: 1 to 4 times per year   . Do you belong  to any clubs or organizations such as church groups, unions, fraternal or athletic groups, or school groups?: No   . How often do you attend meetings of the clubs or organizations you belong to?: Never   . Are you married, widowed, divorced, separated, never married, or living with a partner?: Never married     Review Of Systems  Review of Systems  Constitutional:  Negative for fever.  HENT:  Negative for congestion.   Respiratory:  Negative for chest tightness and shortness of breath.    Cardiovascular:  Negative for chest pain.  Gastrointestinal:  Positive for abdominal pain, nausea and vomiting.  Skin:  Negative for color change.  Psychiatric/Behavioral:  Negative for behavioral problems.   All other systems reviewed and are negative.   Physical Exam   BP 114/84   Pulse 74   Temp 36.4 C (97.6 F) (Temporal)   Resp 18   SpO2 100%   Physical Exam Vitals and nursing note reviewed.  Constitutional:      General: She is not in acute distress. HENT:     Head: Normocephalic.  Eyes:     Conjunctiva/sclera: Conjunctivae normal.  Cardiovascular:     Rate and Rhythm: Regular rhythm.     Pulses: Normal pulses.     Heart sounds: Normal heart sounds.  Pulmonary:     Effort: No respiratory distress.     Breath sounds: Normal breath sounds.  Abdominal:     General: Bowel sounds are normal. There is no distension.     Palpations: Abdomen is soft.     Tenderness: There is abdominal tenderness. There is no guarding or rebound.     Comments: There is minor diffuse tenderness.  No guarding or.  Normal active bowel sounds.  No peritonitis.  Musculoskeletal:        General: No deformity.  Skin:    General: Skin is warm.     Capillary Refill: Capillary refill takes 2 to 3 seconds.     Comments: Normal cap refill.  Neurological:     General: No focal deficit present.  Psychiatric:        Mood and Affect: Mood normal.     ED Course  Medical Decision Making Differential diagnosis includes cannabis hyperemesis syndrome versus dehydration versus electrolyte abnormality versus gastritis.  Patient has been seen and cleared by OB.  9:56 AM Patient continues to complain of nausea and generalized abdominal discomfort.  She is not breast-feeding so she can take Bentyl.  Unfortunately, I cannot give her Haldol since she is in her third trimester pregnancy.  That would have been great for her cannabis hyperemesis syndrome.  Will continue to treat her symptomatically.  She did  have good response with Reglan  and Protonix  in the past so we will try that.  12:12 PM Patient still vomiting.  Will give Zofran .  I do think patient's symptoms are consistent with cannabis hyperemesis syndrome.  I do not think she is withdrawing from opiates as she has no diarrhea, no yawning, no lacrimation.  No other typical signs of opiate withdrawal.  1:22 PM Patient still running.  At this point, I think it is reasonable to admit her for IV hydration.  Patient's care discussed with Dr. Olegario.  Will admit.  Problems Addressed: Abdominal pain, unspecified abdominal location: acute illness or injury that poses a threat to life or bodily functions Cannabis hyperemesis syndrome: acute illness or injury that poses a threat to life or bodily functions Intractable vomiting: acute illness or  injury that poses a threat to life or bodily functions  Amount and/or Complexity of Data Reviewed Labs: ordered. Decision-making details documented in ED Course. Discussion of management or test interpretation with external provider(s): Patient states discussed with OB/GYN  Risk Prescription drug management. Decision regarding hospitalization.     Procedures   No results found for this visit on 10/31/23 (from the past 4464 hours).   ED Results Results for orders placed or performed during the hospital encounter of 10/31/23  Comprehensive Metabolic Panel  Result Value Ref Range   Sodium 136 135 - 145 mmol/L   Potassium 4.1 3.5 - 5.0 mmol/L   Chloride 104 98 - 107 mmol/L   CO2 22.4 21.0 - 32.0 mmol/L   Anion Gap 10 3 - 11 mmol/L   BUN 8 8 - 20 mg/dL   Creatinine 9.23 9.39 - 1.10 mg/dL   BUN/Creatinine Ratio 11    eGFR CKD-EPI (2021) Female >90 >=60 mL/min/1.74m2   Glucose 85 70 - 179 mg/dL   Calcium 8.4 (L) 8.5 - 10.1 mg/dL   Albumin 2.7 (L) 3.5 - 5.0 g/dL   Total Protein 6.8 6.0 - 8.0 g/dL   Total Bilirubin 0.5 0.3 - 1.2 mg/dL   AST 16 15 - 40 U/L   ALT 9 (L) 12 - 78 U/L   Alkaline  Phosphatase 78 46 - 116 U/L  Magnesium   Result Value Ref Range   Magnesium  1.9 1.6 - 2.6 mg/dL  Lipase  Result Value Ref Range   Lipase 24 16 - 77 U/L  CBC w/ Differential  Result Value Ref Range   WBC 11.4 (H) 4.0 - 10.5 10*9/L   RBC 3.92 3.80 - 5.10 10*12/L   HGB 11.9 11.5 - 15.0 g/dL   HCT 65.0 65.9 - 55.9 %   MCV 89.0 80.0 - 98.0 fL   MCH 30.4 27.0 - 34.0 pg   MCHC 34.1 32.0 - 36.0 g/dL   RDW 86.8 88.4 - 85.4 %   MPV 11.2 (H) 7.4 - 10.4 fL   Platelet 229 140 - 415 10*9/L   Neutrophils % 73.1 %   Lymphocytes % 16.0 %   Monocytes % 4.5 %   Eosinophils % 5.8 %   Basophils % 0.2 %   Absolute Neutrophils 8.3 (H) 1.8 - 7.8 10*9/L   Absolute Lymphocytes 1.8 0.7 - 4.5 10*9/L   Absolute Monocytes 0.5 0.1 - 1.0 10*9/L   Absolute Eosinophils 0.7 (H) 0.0 - 0.4 10*9/L   Absolute Basophils 0.0 0.0 - 0.2 10*9/L  Urinalysis with Microscopy with Culture Reflex  Result Value Ref Range   Color, UA Yellow    Clarity, UA Cloudy (A) Clear   Specific Gravity, UA 1.029 (H) 1.010 - 1.025   pH, UA 6.5 5.0 - 8.0   Leukocyte Esterase, UA Moderate (A) Negative   Nitrite, UA Negative Negative   Protein, UA Trace (A) Negative   Glucose, UA Negative Negative, Trace   Ketones, UA Trace (A) Negative   Urobilinogen, UA <2.0 mg/dL <7.9 mg/dL   Bilirubin, UA Negative Negative   Blood, UA Negative Negative   RBC, UA 2 0 - 3 /HPF   WBC, UA 7 (H) 0 - 3 /HPF   Squam Epithel, UA 11 (H) 0 - 10 /HPF   Bacteria, UA Rare (A) None Seen /HPF   WBC Clumps None Seen None Seen /HPF   Hyphal Yeast None Seen None Seen /HPF   Yeast, UA None Seen None Seen /HPF  Results for  orders placed or performed during the hospital encounter of 10/31/23  Urinalysis with Microscopy  Result Value Ref Range   Color, UA Yellow    Clarity, UA Clear Clear   Specific Gravity, UA 1.029 (H) 1.010 - 1.025   pH, UA 6.5 5.0 - 8.0   Leukocyte Esterase, UA Moderate (A) Negative   Nitrite, UA Negative Negative   Protein, UA Trace (A)  Negative   Glucose, UA Negative Negative, Trace   Ketones, UA Trace (A) Negative   Urobilinogen, UA <2.0 mg/dL <7.9 mg/dL   Bilirubin, UA Negative Negative   Blood, UA Negative Negative   RBC, UA 2 0 - 3 /HPF   WBC, UA 9 (H) 0 - 3 /HPF   WBC Clumps None Seen None Seen /HPF   Squam Epithel, UA 9 0 - 10 /HPF   Yeast, UA None Seen None Seen /HPF   Bacteria, UA Rare (A) None Seen /HPF   Hyphal Yeast None Seen None Seen /HPF  Drug Screen, Urine  Result Value Ref Range   Amphetamines Screen, Ur Negative Negative   Barbiturates Screen, Ur Negative Negative   Benzodiazepines Screen, Urine Negative Negative   Cannabinoids Screen, Ur Positive (A) Negative   Cocaine(Metab.)Screen, Urine Negative Negative   Methadone Screen, Urine Negative Negative   Opiates Screen, Ur Positive (A) Negative   Oxycodone  Screen, Ur Positive (A) Negative   No results found.  Medications Administered:  Medications  ondansetron  (ZOFRAN ) injection 4 mg (4 mg Intravenous Incomplete 10/31/23 1321)  sodium chloride  (NS) 0.9 % infusion (has no administration in time range)  sodium chloride  0.9% (NS) bolus 1,000 mL (0 mL Intravenous Stopped 10/31/23 1015)  promethazine  (PHENERGAN ) injection 12.5 mg (12.5 mg Intramuscular Given 10/31/23 0919)  metoclopramide  (REGLAN ) injection 10 mg (10 mg Intravenous Given 10/31/23 1019)  pantoprazole  (Protonix ) injection 40 mg (40 mg Intravenous Given 10/31/23 1024)  famotidine  (PF) (PEPCID ) injection 20 mg (20 mg Intravenous Given 10/31/23 1022)  dicyclomine (BENTYL) injection 10 mg (10 mg Intramuscular Given 10/31/23 1028)  morphine  4 mg/mL injection 4 mg (4 mg Intravenous Given 10/31/23 1152)  ondansetron  (ZOFRAN ) injection 4 mg (4 mg Intravenous Given 10/31/23 1209)  sodium chloride  0.9% (NS) bolus 1,000 mL (1,000 mL Intravenous New Bag 10/31/23 1208)    Discharge Medications (Medications Prescribed during this  ED visit and Patient's Home Medications) :    Your Medication List      ASK your doctor about these medications    aspirin  81 MG tablet Commonly known as: ECOTRIN Take 2 tablets (162 mg total) by mouth.   NIFEdipine  30 MG 24 hr tablet Commonly known as: PROCARDIA  XL Take 1 tablet (30 mg total) by mouth daily. TAKE 1 TABLET (30 MG TOTAL) BY MOUTH DAILY.          Cherie Ardeen Hanger, MD 10/31/23 1325    Cherie Ardeen Hanger, MD 10/31/23 1325

## 2023-10-31 NOTE — Consults (Signed)
 [facility] Acute Telepsychiatry Initial Inpatient Consult   SERVICE DATE: October 31, 2023  Assessment:   Priscilla Castillo is a [id number] at 28w  33 y.o. female with pertinent past medical and psychiatric diagnoses of cannabinoid-induced hyperemesis, opioid use disorder, gestational hypertension, depression, anxiety, ADHD admitted 10/31/2023  8:38 AM for hyperemesis. Her pregnancy is complicated by chronic hypertension on nifedipine  30 mg and ASA, history of preeclampsia and growth restriction. Estimated date of delivery 01/25/24. Patient was seen in consultation by Psychiatry at the request of Priscilla Lenon Motes, MD with Obstetrics (OBS) for evaluation of question of cannabinoid hyperemesis syndrome.  Utox+cannabinoid and oxycodone .    The patient's current presentation of substance use is most consistent with cannabis use disorder, opiate use disorder, anxiety disorder. Medical and psychiatric factors contributing to current presentation include chronic pain, anxiety, depression.   Patient reports consistent cannabis use through her pregnancy to try to manage nausea and mood. Pt denies recently decreasing or changing cannabis use. Patient reports intermittent opiate use for chronic pain. She was previously on suboxone, but reports it made her pain worse and has not been on during pregnancy. Medication for opiate use disorder is recommended standard of care as it has numerous benefits including reducing illegal opioid use, prevents fluctuation of the drug level over a day, reduces maternal mortality, and can reduce obstetric complications. Discussed options for methadone or suboxone today with patient, patient declined. Patient was also offered treatment for anxiety, depression and ADHD. She declined to start a medication in the hospital but is interested in outpatient referrals.    There are no safety issues at this time and no indication for psychiatric hospitalization.    Please see below for  detailed recommendations.  Risk Assessment: A suicide and violence risk assessment was performed as part of this evaluation. Specific questioning about thoughts, plans, suicidal intent, and self-harm: Denies thoughts of self-harm. Denies suicidal ideation, plans, or intent. . Risk factors for self-harm/suicide: current substance abuse, history of substance abuse, and history of depression. Protective factors against self-harm/suicide:  lack of active SI, no history of previous suicide attempts , and supportive family. Risk factors for harm to others: past substance abuse. Protective factors against harm to others: N/A.   Recommendations:   Safety Considerations: -- Based on this psychiatric evaluation, we estimate the patient to be at low risk for suicide in the current setting. We recommend routine level of observation per hospital policy.   IVC Status:  This patient does NOT meet IVC criteria due to lack of acute or imminent danger to self or others.  Medication Recommendations:  -- Recommend scheduling medications for N/V. Defer to OB.  -- Recommended methadone or suboxone for OUD.  Patient declined during interview. Would readdress starting methadone when she is less nauseated.    -- Would not recommend ambien given substance use issues    Medical Decision Making Capacity:  -- A formal capacity assessement was not performed as a part of this evaluation.  If specific capacity questions arise, please contact our team as below.   Further Work-up:  -- Continue to work-up and treat possible medical conditions that may be contributing to current presentation.   Disposition:  -- We have encouraged the patient to establish outpatient mental health care by contacting either their insurance carrier or their local Managed Care Organization, [facility] (Phone [phone]; Crisis Line [phone]).  PLEASE INCLUDE IN PATIENT AVS.  [facility] Mood Disorders - Perinatal Psychiatry  Clinic  This teaching  clinic provides assessment and treatment, including both psychotherapy and medication for women with depression or anxiety disorders during pregnancy and the postpartum period.  To schedule an appointment call: [phone] - option [phone] [email or web address]   [facility] -- Opioid use medication clinic [place]   I was outside the hospital.  I spent 30 minutes on the real-time audio and video with the patient for a a consultation. Total time was 90 minutes and over 50% of the service was counseling and/or coordination of care within the patient unit.    The patient and/or parent/guardian has been advised of the potential risks and limitations of this mode of treatment (including, but not limited to, the absence of in-person examination) and has agreed to be treated using telemedicine. The  Patient's, patient's guardian's and/or patient's family's questions regarding telemedicine have been answered.    Priscilla Sans, MD    Subjective:   REASON FOR CONSULT: Patient is seen in consultation at the request of Priscilla Lenon Motes, MD for evaluation of Behavioral management and Drug use. Repeated admissions to hospital for uncontrolled vomiting (1/25, 5/25, 10/25)  RN note:  Patient sitting up on side of bed when I entered room. Patient has emesis bag in her hand and below her in the floor patient had two separate areas where she vomited in the floor instead of beg. I encouraged patient to use the bag instead of the floor for safety so someone does not fall. Patient got angry and stated she wanted a patient advocate because we were not treating her right here. I ensured patient I would notify the charge nurse of her request and have a patient advocate sent to her room. I informed patient of doctors order to obtain fetal heart tones. Patient got angry again and stated My baby isn't the one here to be seen. It's me. I was already put through all  of that upstairs in labor and delivery. And now you all won't leave me alone with stuff about the baby. This is about me. Not my baby. And I just want to be treated like it. I did obtain fetal heart tones and alerted charge nurse of patient request for patient advocate and charge nurse did call nursing supervisor about same.      PATIENT INTERVIEW: Pt continues to be nauseated and in pain. Abdominal pain related to the nausea and vomiting.  Vomiting for 2 days. Not able to keep anything down.   She has been on suboxone in the past. She has been involved in pain management. She was seeing a psychiatrist before pregnancy.  She isn't seeing them anymore.    She has been using cannabis regularly in pregnancy. She has been using the same amount of cannabis in the last few days. She reports using cannabis to try to manage her nausea and pain.  She reports that she has used oxycodone  regularly, 2-3 days a week. She said she doesn't want to try methadone in the hospital. The dilaudid  helped.  She was on suboxone before pregnancy, but it made pain worse and doesn't want to be on meds during pregnancy. Provided education/counseling that medications like methadone and help her pain, decrease opiate use and be safer for the baby. She states has had terrible ADHD. She would like to be treated for ADHD and not other things like suboxone.    She reports she tried psychiatric medication, not on any currently, they were causing her side effects. She has anxiety and  depression. She would like referrals to outpatient mental health providers.    She declined interventions today and requested the interview conclude. (Actively vomiting)  Denies SI and HI.  Denied AVH. Denies any safey concerns. She reports she has good family support.     PAST PSYCHIATRIC HISTORY: Prior psychiatric diagnoses: OUD, cannabis use disorder, GAD, MDD Psychiatric hospitalizations: no Inpatient substance abuse treatment: denies Suicide  attempts: no Non-suicidal self-injury: no Medication trials/compliance: Suboxone, diazepam , sertraline, Abilify, clonidine, trazodone,buspirone, Suboxone Current/previous mental health treatment: none current   SOCIAL HISTORY:  Has family support mother and partner.   SUBSTANCE USE: Tobacco use: Current every-day smoker Alcohol use: Denies current use Drug use: opioids, marijuana  09/05/2023 09/05/2023  1 Buprenorphine 8 Mg Tablet Sl 15.00 7 Ma Bow [id number] Wal [phone]) 0/0 17.14 mg Medicaid [place]  05/27/2023 05/11/2023  1 Diazepam  10 Mg Tablet 1.00 1 J Kee [id number] Wal ([id number]) 0/0 1.00 LME Medicaid [place]  05/26/2023 05/26/2023  1 Buprenorphine 8 Mg Tablet Sl 14.00 7 Nn Aha [id number] Wal (0019) 0/0 16.00 mg Medicaid [place]  05/18/2023 05/17/2023  1 Suboxone 8 Mg-2 Mg Sl Film 14.00 7 Nn Aha [id number] Wal (0019) 0/0 16.00 mg Medicaid [place]  05/03/2023 05/03/2023  1 Suboxone 8 Mg-2 Mg Sl Film 14.00 7 Ma Bow [id number] Wal (0019) 0/0 16.00 mg Medicaid [place]                                                                                              FAMILY HISTORY: The patient's family history includes Hypertension in her mother; No Known Problems in her daughter and daughter..  Medical/surgical history, medications, and allergies reviewed and updated as appropriate.  Objective:   VITAL SIGNS: BP 114/84   Pulse 74   Temp 36.4 C (97.6 F) (Temporal)   Resp 18   SpO2 100%   Mental Status Exam: Appearance:   fair grooming  Behavior:  Actively vomiting  Motor:  no abnormal movements noted  Speech/Language:   normal rate, rhythm, volume  Mood:   Sick  Affect:  Congruent, dysphoric  Thought process and Associations:  logical, linear, clear, coherent, goal directed  Thought Content:     No grandiose, self-referential, persecutory, or paranoid delusions noted  Perceptual disturbances:    denies auditory and visual hallucinations   Thoughts of self harm  denies thoughts of self-harm. Denies suicidal  ideation, plans, or intent.   Thoughts of harm to others  denies any thought of harming others/no homicidal ideation  Orientation:  Oriented to person, place, time, and general circumstances  Attention and Concentration:  Impaired from nausea and discomfort  Memory:  not formally tested, but grossly intact   Fund of knowledge:   Consistent with level of education and development  Insight:    Fair  Judgment:   Fair  Impulse Control:  Intact   TEST RESULTS: I have reviewed the labs, studies, and imaging from the last 24 hours if available.    Pt has received reglan  1019, protonix , zofran  4mg  x2, promethazine  x2 (last dose 1635), capsaicin, bentyl, famotidine , diluadid 1623, morphine  1152.

## 2023-11-01 ENCOUNTER — Emergency Department (HOSPITAL_COMMUNITY)
Admission: EM | Admit: 2023-11-01 | Discharge: 2023-11-01 | Disposition: A | Discharge status: Home / Self Care | Attending: Emergency Medicine | Admitting: Emergency Medicine

## 2023-11-01 ENCOUNTER — Other Ambulatory Visit: Payer: Self-pay

## 2023-11-01 DIAGNOSIS — Z91040 Latex allergy status: Secondary | ICD-10-CM | POA: Insufficient documentation

## 2023-11-01 DIAGNOSIS — O26892 Other specified pregnancy related conditions, second trimester: Secondary | ICD-10-CM | POA: Insufficient documentation

## 2023-11-01 DIAGNOSIS — Z79899 Other long term (current) drug therapy: Secondary | ICD-10-CM | POA: Insufficient documentation

## 2023-11-01 DIAGNOSIS — I10 Essential (primary) hypertension: Secondary | ICD-10-CM | POA: Insufficient documentation

## 2023-11-01 DIAGNOSIS — Z3A27 27 weeks gestation of pregnancy: Secondary | ICD-10-CM | POA: Diagnosis not present

## 2023-11-01 DIAGNOSIS — Z7982 Long term (current) use of aspirin: Secondary | ICD-10-CM | POA: Insufficient documentation

## 2023-11-01 DIAGNOSIS — R109 Unspecified abdominal pain: Secondary | ICD-10-CM | POA: Insufficient documentation

## 2023-11-01 DIAGNOSIS — O219 Vomiting of pregnancy, unspecified: Secondary | ICD-10-CM | POA: Insufficient documentation

## 2023-11-01 DIAGNOSIS — R112 Nausea with vomiting, unspecified: Secondary | ICD-10-CM

## 2023-11-01 LAB — COMPREHENSIVE METABOLIC PANEL WITH GFR
ALT: 7 U/L (ref 0–44)
AST: 13 U/L — ABNORMAL LOW (ref 15–41)
Albumin: 3.5 g/dL (ref 3.5–5.0)
Alkaline Phosphatase: 59 U/L (ref 38–126)
Anion gap: 13 (ref 5–15)
BUN: 9 mg/dL (ref 6–20)
CO2: 17 mmol/L — ABNORMAL LOW (ref 22–32)
Calcium: 8.4 mg/dL — ABNORMAL LOW (ref 8.9–10.3)
Chloride: 106 mmol/L (ref 98–111)
Creatinine, Ser: 0.69 mg/dL (ref 0.44–1.00)
GFR, Estimated: >60 mL/min (ref >60)
Glucose, Bld: 94 mg/dL (ref 70–99)
Potassium: 3.5 mmol/L (ref 3.5–5.1)
Sodium: 137 mmol/L (ref 135–145)
Total Bilirubin: 0.6 mg/dL (ref 0.0–1.2)
Total Protein: 6 g/dL — ABNORMAL LOW (ref 6.5–8.1)

## 2023-11-01 LAB — URINALYSIS, ROUTINE W REFLEX MICROSCOPIC
Bilirubin Urine: NEGATIVE
Glucose, UA: NEGATIVE mg/dL
Hgb urine dipstick: NEGATIVE
Ketones, ur: 20 mg/dL — AB
Leukocytes,Ua: NEGATIVE
Nitrite: NEGATIVE
Protein, ur: NEGATIVE mg/dL
Specific Gravity, Urine: 1.015 (ref 1.005–1.030)
pH: 6 (ref 5.0–8.0)

## 2023-11-01 LAB — URINE DRUG SCREEN
Amphetamines: NEGATIVE
Barbiturates: NEGATIVE
Benzodiazepines: NEGATIVE
Cocaine: NEGATIVE
Fentanyl: NEGATIVE
Methadone Scn, Ur: NEGATIVE
Opiates: POSITIVE — AB
Tetrahydrocannabinol: POSITIVE — AB

## 2023-11-01 LAB — LIPASE, BLOOD: Lipase: 16 U/L (ref 11–51)

## 2023-11-01 LAB — CBC
HCT: 32.2 % — ABNORMAL LOW (ref 36.0–46.0)
Hemoglobin: 10.7 g/dL — ABNORMAL LOW (ref 12.0–15.0)
MCH: 30.5 pg (ref 26.0–34.0)
MCHC: 33.2 g/dL (ref 30.0–36.0)
MCV: 91.7 fL (ref 80.0–100.0)
Platelets: 224 K/uL (ref 150–400)
RBC: 3.51 MIL/uL — ABNORMAL LOW (ref 3.87–5.11)
RDW: 13.2 % (ref 11.5–15.5)
WBC: 10.3 K/uL (ref 4.0–10.5)
nRBC: 0 % (ref 0.0–0.2)

## 2023-11-01 MED ORDER — DIPHENHYDRAMINE HCL 50 MG/ML IJ SOLN
25.0000 mg | Freq: Once | INTRAMUSCULAR | Status: AC
  Administered 2023-11-01: 25 mg via INTRAVENOUS
  Filled 2023-11-01: qty 1

## 2023-11-01 MED ORDER — PROMETHAZINE HCL 12.5 MG RE SUPP: 12.5000 mg | Freq: Four times a day (QID) | RECTAL | 0 refills | Status: AC | PRN

## 2023-11-01 MED ORDER — MORPHINE SULFATE (PF) 4 MG/ML IV SOLN
4.0000 mg | Freq: Once | INTRAVENOUS | Status: AC
  Administered 2023-11-01: 4 mg via INTRAVENOUS
  Filled 2023-11-01: qty 1

## 2023-11-01 MED ORDER — METOCLOPRAMIDE HCL 5 MG/ML IJ SOLN
10.0000 mg | Freq: Once | INTRAMUSCULAR | Status: AC
  Administered 2023-11-01: 10 mg via INTRAVENOUS
  Filled 2023-11-01: qty 2

## 2023-11-01 MED ORDER — SODIUM CHLORIDE 0.9 % IV BOLUS
1000.0000 mL | Freq: Once | INTRAVENOUS | Status: AC
  Administered 2023-11-01: 1000 mL via INTRAVENOUS

## 2023-11-01 MED ORDER — ONDANSETRON HCL 4 MG/2ML IJ SOLN
4.0000 mg | Freq: Once | INTRAMUSCULAR | Status: AC
  Administered 2023-11-01: 4 mg via INTRAVENOUS
  Filled 2023-11-01: qty 2

## 2023-11-01 MED ORDER — LACTATED RINGERS IV BOLUS
1000.0000 mL | Freq: Once | INTRAVENOUS | Status: AC
  Administered 2023-11-01: 1000 mL via INTRAVENOUS

## 2023-11-01 NOTE — Progress Notes (Addendum)
 Pt is G8P3 at 27wk 6days pregnant who presents to [facility] complaining of N/V and abdominal pain that does not feel like UCs. It is reported to [facility] that she has a history of hyperemesis. According to pts chart, she has chronic hypertension and is on procardia  for that.  Today, she acknowledges positive fetal movement and denis LOF, uc pain, and vaginal bleeding.  FHR monitors have been applied.  RROB will monitor remotely.

## 2023-11-01 NOTE — ED Notes (Signed)
 OB Rapid Response Nurse notified and fetal monitor in place

## 2023-11-01 NOTE — Discharge Instructions (Addendum)
Follow-up with your OB/GYN doctor next week 

## 2023-11-01 NOTE — Progress Notes (Signed)
 [facility] called and asked to adjust fhr monitoring.

## 2023-11-01 NOTE — ED Provider Notes (Signed)
 Manual cervix exam done.  Cervix is closed and 0% effaced   Suzette Pac, MD 11/01/23 1122

## 2023-11-01 NOTE — Progress Notes (Signed)
 Spoke with Priscilla Castillo and told them POC.  They report that pt is currently sleeping.

## 2023-11-01 NOTE — ED Provider Notes (Signed)
 [place] EMERGENCY DEPARTMENT AT [facility] Provider Note   CSN: [id number] Arrival date & time: 11/01/23  9084     Patient presents with: Abdominal Pain   Priscilla Castillo is a 33 y.o. female.She has history of GERD, hypertension, anxiety, depression, ADHD.  She presents the ER today complaining of persistent nausea and vomiting.  She is 27 weeks 6 days pregnant, [facility].  She is [id number].  She has been not able to keep anything down for the past several days, was seen at [facility] yesterday and admitted for observation for nausea and vomiting that was felt to be related to cannabis hyperemesis.  She states she was discharged home with promethazine  but cannot keep it down.  Denies any vaginal bleeding, leaking of fluids.  She states she does have some diffuse abdominal pain that feels like prior episodes of the cyclical vomiting in the past but denies any cramping or back pain.    Abdominal Pain      Prior to Admission medications   Medication Sig Start Date End Date Taking? Authorizing Provider  promethazine  (PHENERGAN ) 12.5 MG suppository Place 1 suppository (12.5 mg total) rectally every 6 (six) hours as needed for nausea or vomiting. 11/01/23  Yes Sanii Kukla A, PA-C  aspirin  EC 81 MG tablet Take 2 tablets (162 mg total) by mouth daily. Swallow whole. 07/19/23   Kizzie Suzen SAUNDERS, CNM  diphenhydrAMINE  HCl (BENADRYL  PO) Take by mouth. Patient not taking: Reported on 10/11/2023    [provider]  diphenhydramine -acetaminophen  (TYLENOL  PM) 25-500 MG TABS tablet Take 1 tablet by mouth at bedtime as needed. Patient not taking: Reported on 10/11/2023    [provider]  NIFEdipine  (PROCARDIA -XL/NIFEDICAL-XL) 30 MG 24 hr tablet Take 1 tablet (30 mg total) by mouth daily. 07/19/23   Kizzie Suzen SAUNDERS, CNM  ondansetron  (ZOFRAN -ODT) 4 MG disintegrating tablet Take 1 tablet (4 mg total) by mouth every 8 (eight) hours as needed for nausea  or vomiting. Patient not taking: Reported on 10/11/2023 06/14/23   Evonnie Lenis, MD  pantoprazole  (PROTONIX ) 40 MG tablet Take 1 tablet (40 mg total) by mouth 2 (two) times daily. Patient not taking: Reported on 10/11/2023 06/15/23   Evonnie Lenis, MD  Prenatal Vit-Fe Fumarate-FA (PRENATAL MULTIVITAMIN) TABS tablet Take 1 tablet by mouth daily at 12 noon. Patient not taking: Reported on 10/11/2023 06/14/23   Evonnie Lenis, MD  promethazine  (PHENERGAN ) 25 MG tablet Take 0.5-1 tablets (12.5-25 mg total) by mouth every 6 (six) hours as needed. 10/11/23   Kizzie Suzen SAUNDERS, CNM  pyridOXINE  (B-6) 100 MG tablet Take 1 tablet (100 mg total) by mouth daily. Patient not taking: Reported on 10/11/2023 06/15/23   Evonnie Lenis, MD  scopolamine  (TRANSDERM-SCOP) 1 MG/3DAYS Place 1 patch (1.5 mg total) onto the skin every 3 (three) days. Patient not taking: Reported on 10/11/2023 06/14/23   Evonnie Lenis, MD    Allergies: Shellfish allergy, Banana, Latex, and Nsaids    Review of Systems  Gastrointestinal:  Positive for abdominal pain.    Updated Vital Signs BP 99/64   Pulse 77   Temp 98 F (36.7 C) (Oral)   Resp 16   Ht 5' 1 (1.549 m)   Wt 86.6 kg   LMP  (LMP Unknown)   SpO2 100%   BMI 36.09 kg/m   Physical Exam Vitals and nursing note reviewed.  Constitutional:      General: She is not in acute distress.    Appearance: She  is well-developed.  HENT:     Head: Normocephalic and atraumatic.  Eyes:     Conjunctiva/sclera: Conjunctivae normal.  Cardiovascular:     Rate and Rhythm: Normal rate and regular rhythm.     Heart sounds: No murmur heard. Pulmonary:     Effort: Pulmonary effort is normal. No respiratory distress.     Breath sounds: Normal breath sounds.  Abdominal:     Palpations: Abdomen is soft.     Tenderness: There is no abdominal tenderness.  Musculoskeletal:        General: No swelling.     Cervical back: Neck supple.  Skin:    General: Skin is warm and dry.     Capillary Refill:  Capillary refill takes less than 2 seconds.  Neurological:     Mental Status: She is alert.  Psychiatric:        Mood and Affect: Mood normal.     (all labs ordered are listed, but only abnormal results are displayed) Labs Reviewed  COMPREHENSIVE METABOLIC PANEL WITH GFR - Abnormal; Notable for the following components:      Result Value   CO2 17 (*)    Calcium 8.4 (*)    Total Protein 6.0 (*)    AST 13 (*)    All other components within normal limits  CBC - Abnormal; Notable for the following components:   RBC 3.51 (*)    Hemoglobin 10.7 (*)    HCT 32.2 (*)    All other components within normal limits  LIPASE, BLOOD  URINALYSIS, ROUTINE W REFLEX MICROSCOPIC  DRUG SCREEN 10 W/CONF, SERUM    EKG: None  Radiology: No results found.   Procedures   Medications Ordered in the ED  sodium chloride  0.9 % bolus 1,000 mL (1,000 mLs Intravenous Bolus 11/01/23 0955)  metoCLOPramide  (REGLAN ) injection 10 mg (10 mg Intravenous Given 11/01/23 1021)  diphenhydrAMINE  (BENADRYL ) injection 25 mg (25 mg Intravenous Given 11/01/23 1021)  lactated ringers  bolus 1,000 mL (1,000 mLs Intravenous Bolus 11/01/23 1020)  morphine  (PF) 4 MG/ML injection 4 mg (4 mg Intravenous Given 11/01/23 1119)                                    Medical Decision Making This patient presents to the ED for concern of abdominal pain with nausea and vomiting, this involves an extensive number of treatment options, and is a complaint that carries with it a high risk of complications and morbidity.  The differential diagnosis includes gastroenteritis, gastritis, cannabis hyperemesis, return labor, other   Co morbidities that complicate the patient evaluation :   [redacted] weeks pregnant, history of cannabis hyperemesis syndrome and opioid use disorder and cannabis abuse.   Additional history obtained:  Additional history obtained from EMR External records from outside source obtained and reviewed including prior notes,  labs, imaging, ED visit and was admitted from yesterday and psych consult from yesterday at [facility]   Lab Tests:  I Ordered, and personally interpreted labs.  The pertinent results include: CBC with mild anemia, lipase normal, CMP normal    Consultations Obtained:  I requested consultation with the B,  and discussed lab and imaging findings as well as pertinent plan - they recommend: Patient was monitored, provider wanted cervical check which was done by Dr. Suzette and showed  cervix ith no dilation or effacement, they monitored for another.  And feel patient can be discharged safely,  as urinalysis and treat UTI if present.   Problem List / ED Course / Critical interventions / Medication management  Patient here for nausea vomiting abdominal pain which she states feels like her usual cannabinoid hyperemesis, she has had this in the past, was seen yesterday and admitted for IV fluids and antiemetics.  She is feeling better here today after IV fluids and patient is 7 and Reglan  and Benadryl .  [facility] was consulted, she was monitored, they want her to be treated for UTI if present, pending UA at this time.  Provider for OB was Lynwood Solomons per RN.  Dr. Suzette pending UA.  Straight cath ordered. Discharged ordered with Phenergan  suppositories.  Patient states she has not been to keep down the p.o. Phenergan  so has not been able to help her.  Has no SI or HI, was offered methadone yesterday for opiate use and declined this. I have reviewed the patients home medicines and have made adjustments as needed      Amount and/or Complexity of Data Reviewed Labs: ordered.  Risk Prescription drug management.        Final diagnoses:  Nausea and vomiting, unspecified vomiting type    ED Discharge Orders          Ordered    promethazine  (PHENERGAN ) 12.5 MG suppository  Every 6 hours PRN        11/01/23 1338               Suellen Sherran LABOR, PA-C 11/01/23 1353     Suzette Pac, MD 11/03/23 1001

## 2023-11-01 NOTE — Progress Notes (Signed)
 [facility] called and asked again to adjust fhr monitor.  They report taht pt has just taken some PO water and they will see if she keeps it down

## 2023-11-01 NOTE — ED Triage Notes (Signed)
 Pt c/o vomiting. Pt is [redacted] weeks pregnant and hx of vomiting during the pregnancy. Pt tried going to [facility] yesterday and they would not help her because she was pregnant and was sent to labor and delivery who told her nothing they could do for her vomiting OB workup was normal.

## 2023-11-01 NOTE — Progress Notes (Addendum)
 Dr Eveline notified of pt and status.  He said he would like to see if fluids space out her UI.  Also wants to wait and see if medications help pt be able to keep something down and find out the results of her urinalysis.  He would like someone to check her cervix if someone is available to do so.  Dr Eveline also notified of variable decels.

## 2023-11-01 NOTE — Progress Notes (Addendum)
 [facility] calls to say that their physician has checked pt's cervix ans she is closed.  The ED nurse also reports that he gave her morphine  for her pain.  [facility] asks for them to adjust fhr monitor at this time.

## 2023-11-01 NOTE — Progress Notes (Signed)
 Dr Eveline notified that for about Priscilla Castillo has had difficulty obtaining a fhr tracing but from what is shown, fhr is reassuring for gestational age.  He is also made aware that pt's cervix is closed per the ED physician and that UI has decreased.  MD also told that urinalysis has yet to be obtained. Dr Eveline is clearing pt obstetrically at this time.  He says for ED to treat the N/V and UTI if she does in fact have one.  Priscilla Salmon RN made aware.

## 2023-11-01 NOTE — ED Notes (Signed)
 Made Pt aware we need a urine sample, Pt said she would let us  know wen he can give a sample.

## 2023-11-28 ENCOUNTER — Encounter: Payer: Self-pay | Admitting: Radiology

## 2023-12-08 NOTE — Telephone Encounter (Addendum)
 Per Dr. May scheduled Csection with tubal  CPT: [phone], [id number]   Surgery 01/13/24 @ 7:15 am [facility] Pre op 01/06/24 @ 9:00 am [facility] Pre op 01/06/24 @ 10:00 am  Notified Vernell in [facility] and [place]  at [employer]  Patient notified

## 2023-12-14 ENCOUNTER — Encounter: Admitting: Licensed Clinical Social Worker
